# Patient Record
Sex: Female | Born: 1950
Health system: Southern US, Community
[De-identification: ages and names within clinical notes are randomized; demographics above are authoritative.]

## PROBLEM LIST (undated history)

## (undated) DIAGNOSIS — T7840XA Allergy, unspecified, initial encounter: Secondary | ICD-10-CM

## (undated) DIAGNOSIS — I714 Abdominal aortic aneurysm, without rupture, unspecified: Secondary | ICD-10-CM

## (undated) DIAGNOSIS — D869 Sarcoidosis, unspecified: Secondary | ICD-10-CM

## (undated) DIAGNOSIS — N289 Disorder of kidney and ureter, unspecified: Secondary | ICD-10-CM

## (undated) DIAGNOSIS — H269 Unspecified cataract: Secondary | ICD-10-CM

## (undated) DIAGNOSIS — J45909 Unspecified asthma, uncomplicated: Secondary | ICD-10-CM

## (undated) DIAGNOSIS — E785 Hyperlipidemia, unspecified: Secondary | ICD-10-CM

## (undated) DIAGNOSIS — I10 Essential (primary) hypertension: Secondary | ICD-10-CM

## (undated) DIAGNOSIS — H409 Unspecified glaucoma: Secondary | ICD-10-CM

## (undated) DIAGNOSIS — E079 Disorder of thyroid, unspecified: Secondary | ICD-10-CM

## (undated) DIAGNOSIS — M1990 Unspecified osteoarthritis, unspecified site: Secondary | ICD-10-CM

## (undated) DIAGNOSIS — E2740 Unspecified adrenocortical insufficiency: Secondary | ICD-10-CM

## (undated) HISTORY — PX: RENAL BIOPSY: SHX156

## (undated) HISTORY — PX: BREAST BIOPSY: SHX20

## (undated) HISTORY — DX: Allergy, unspecified, initial encounter: T78.40XA

## (undated) HISTORY — DX: Unspecified cataract: H26.9

## (undated) HISTORY — PX: OTHER SURGICAL HISTORY: SHX169

## (undated) HISTORY — PX: CARDIAC VALVE REPLACEMENT: SHX585

## (undated) HISTORY — DX: Unspecified asthma, uncomplicated: J45.909

## (undated) HISTORY — PX: ABDOMINAL AORTIC ANEURYSM REPAIR: SUR1152

## (undated) HISTORY — DX: Unspecified glaucoma: H40.9

## (undated) HISTORY — PX: EYE SURGERY: SHX253

---

## 1998-05-28 ENCOUNTER — Other Ambulatory Visit: Admission: RE | Admit: 1998-05-28 | Discharge: 1998-05-28 | Payer: Self-pay | Admitting: Obstetrics and Gynecology

## 1999-05-21 ENCOUNTER — Encounter: Admission: RE | Admit: 1999-05-21 | Discharge: 1999-05-21 | Payer: Self-pay | Admitting: Rheumatology

## 1999-05-21 ENCOUNTER — Encounter: Payer: Self-pay | Admitting: Rheumatology

## 1999-06-04 ENCOUNTER — Encounter: Payer: Self-pay | Admitting: Surgery

## 1999-06-04 ENCOUNTER — Ambulatory Visit (HOSPITAL_COMMUNITY): Admission: RE | Admit: 1999-06-04 | Discharge: 1999-06-04 | Payer: Self-pay | Admitting: Surgery

## 1999-06-20 ENCOUNTER — Encounter: Payer: Self-pay | Admitting: Surgery

## 1999-06-24 ENCOUNTER — Encounter (INDEPENDENT_AMBULATORY_CARE_PROVIDER_SITE_OTHER): Payer: Self-pay | Admitting: Specialist

## 1999-06-24 ENCOUNTER — Inpatient Hospital Stay (HOSPITAL_COMMUNITY): Admission: RE | Admit: 1999-06-24 | Discharge: 1999-06-29 | Payer: Self-pay | Admitting: Surgery

## 1999-06-24 ENCOUNTER — Encounter: Payer: Self-pay | Admitting: Surgery

## 1999-06-25 ENCOUNTER — Encounter: Payer: Self-pay | Admitting: Surgery

## 1999-06-26 ENCOUNTER — Encounter: Payer: Self-pay | Admitting: Surgery

## 1999-06-27 ENCOUNTER — Encounter: Payer: Self-pay | Admitting: Surgery

## 1999-07-09 ENCOUNTER — Encounter: Admission: RE | Admit: 1999-07-09 | Discharge: 1999-07-09 | Payer: Self-pay | Admitting: Surgery

## 1999-07-09 ENCOUNTER — Encounter: Payer: Self-pay | Admitting: Surgery

## 1999-07-16 ENCOUNTER — Encounter: Admission: RE | Admit: 1999-07-16 | Discharge: 1999-07-16 | Payer: Self-pay | Admitting: Surgery

## 1999-07-16 ENCOUNTER — Encounter: Payer: Self-pay | Admitting: Surgery

## 2000-04-13 ENCOUNTER — Other Ambulatory Visit: Admission: RE | Admit: 2000-04-13 | Discharge: 2000-04-13 | Payer: Self-pay | Admitting: Obstetrics and Gynecology

## 2000-08-28 ENCOUNTER — Encounter: Payer: Self-pay | Admitting: Rheumatology

## 2000-08-28 ENCOUNTER — Ambulatory Visit (HOSPITAL_COMMUNITY): Admission: RE | Admit: 2000-08-28 | Discharge: 2000-08-28 | Payer: Self-pay | Admitting: Rheumatology

## 2001-07-30 ENCOUNTER — Other Ambulatory Visit: Admission: RE | Admit: 2001-07-30 | Discharge: 2001-07-30 | Payer: Self-pay | Admitting: Obstetrics and Gynecology

## 2001-11-01 ENCOUNTER — Encounter: Admission: RE | Admit: 2001-11-01 | Discharge: 2001-11-01 | Payer: Self-pay | Admitting: Rheumatology

## 2001-11-01 ENCOUNTER — Encounter: Payer: Self-pay | Admitting: Rheumatology

## 2002-05-06 ENCOUNTER — Ambulatory Visit (HOSPITAL_COMMUNITY): Admission: RE | Admit: 2002-05-06 | Discharge: 2002-05-06 | Payer: Self-pay | Admitting: Gastroenterology

## 2002-10-11 ENCOUNTER — Other Ambulatory Visit: Admission: RE | Admit: 2002-10-11 | Discharge: 2002-10-11 | Payer: Self-pay | Admitting: Obstetrics and Gynecology

## 2006-05-12 ENCOUNTER — Encounter: Admission: RE | Admit: 2006-05-12 | Discharge: 2006-05-12 | Payer: Self-pay | Admitting: Rheumatology

## 2006-06-09 ENCOUNTER — Ambulatory Visit: Payer: Self-pay | Admitting: Vascular Surgery

## 2008-07-06 ENCOUNTER — Encounter: Admission: RE | Admit: 2008-07-06 | Discharge: 2008-07-06 | Payer: Self-pay | Admitting: Rheumatology

## 2008-07-18 ENCOUNTER — Ambulatory Visit: Payer: Self-pay | Admitting: Vascular Surgery

## 2010-07-09 NOTE — Assessment & Plan Note (Signed)
OFFICE VISIT   Cheryl [name], Cheryl [name]  DOB:  1950/12/01                                       07/18/2008  [id number]   The patient is a 60 year old female who I evaluated in 2008 with very  small abdominal aortic aneurysm.  She had previously undergone resection  of a descending thoracic aortic aneurysm by Dr. Cyndia Bent in 2001.  Her  aneurysm has been in the 2.4-2.8 cm range when last checked in 2008 and  a repeat duplex scan in our office today reveals the maximum diameter to  be about 2.4-2.5 cm.  She has had no abdominal or back symptoms.  She  also denies any claudication symptoms nor hemiparesis, aphasia,  amaurosis fugax, diplopia, blurred vision or syncope.   PAST MEDICAL HISTORY:  1. Sarcoidosis.  2. Possible fibromyalgia.  3. Hypertension.  4. Hyperlipidemia.  5. Negative for diabetes, coronary artery disease, COPD or stroke.   FAMILY HISTORY:  Positive for coronary artery disease in her mother and  father, adult onset diabetes in many family members and negative for  stroke.   SOCIAL HISTORY:  The patient does not use tobacco or alcohol and is  retired.   ALLERGIES:  Allergies to erythromycin, penicillin.   MEDICATIONS:  Please see health history form.   PHYSICAL EXAMINATION:  Vital signs:  Blood pressure 120/72, heart rate  56, respirations 14.  General:  She is a healthy-appearing female in no  apparent distress.  Alert and oriented x3.  Neck:  Supple.  3+ carotid  pulses palpable.  No bruits are audible.  Neurologic:  Normal.  No  palpable adenopathy in the neck.  Chest:  Clear to auscultation.  Abdomen:  Soft, nontender with no pulsatile mass detected.  She has 3+  femoral, popliteal and dorsalis pedis pulses bilaterally.   I discussed her the fact that the aneurysm is quite small and has not  enlarged in the last few years.  I do not think it requires routine  followup.  It is slightly larger than the normal diameter of an aorta.  At the very least an ultrasound in 5 years would be adequate to look at  the diameter of this but she does not need to be followed by a vascular  surgeon unless there is evidence of significant enlargement.   Nelda Severe Kellie Simmering, M.D.  Electronically Signed   JDL/MEDQ  D:  07/18/2008  T:  07/19/2008  Job:  [id number]   cc:   Ander Slade. Rockwell Alexandria, M.D.

## 2010-07-09 NOTE — Procedures (Signed)
DUPLEX ULTRASOUND OF ABDOMINAL AORTA   INDICATION:  Follow up abdominal aortic aneurysm.   HISTORY:  Diabetes:  No.  Cardiac:  No.  Hypertension:  Yes.  Smoking:  No.  Connective Tissue Disorder:  Family History:  Previous Surgery:   DUPLEX EXAM:         AP (cm)                   TRANSVERSE (cm)  Proximal             2.00 cm                   2.10 cm  Mid                  2.07 cm                   2.06 cm  Distal               2.36 cm                   2.42 cm  Right Iliac          1.00 cm                   1.06 cm  Left Iliac           0.96 cm                   1.04 cm   PREVIOUS:  Date:  AP:  2.8  TRANSVERSE:   IMPRESSION:  1. Mild dilatation of distal aorta noted, measuring 2.36 cm X 2.42 cm.  2. A size smaller than previously recorded.   ___________________________________________  Nelda Severe Kellie Simmering, M.D.   MG/MEDQ  D:  07/18/2008  T:  07/18/2008  Job:  [id number]

## 2010-07-12 NOTE — Op Note (Signed)
[place]. [facility]  Patient:    Cheryl [name], Cheryl [name]                       MRN: [id number] Proc. Date: 06/24/99 Adm. Date:  [id number] Attending:  Valla Leaver CC:         Gaye Pollack, M.D., CVTS Office                           Operative Report  PREOPERATIVE DIAGNOSIS:  Descending thoracic aortic aneurysm.  POSTOPERATIVE DIAGNOSIS:  Descending thoracic aortic aneurysm.  PROCEDURE:  Repair of descending thoracic aortic aneurysm using a 22 mm Hemashield tube graft and using left atrial to descending aortic partial circulatory bypass.  ATTENDING SURGEON:  Gaye Pollack, M.D.  ASSISTANT:  Lilia Argue. Servando Snare, M.D.  ANESTHESIA:  General endotracheal.  CLINICAL HISTORY:  This patient is a 60 year old black female with an unknown syndrome, including musculoskeletal pain, anemia, leukopenia, renal dysfunction, sarcoidosis of the scalp, elevated SED rate, positive rheumatoid factor in the past, and an elevated ACE level.  She was recently evaluated with right ankle swelling and a chest x-ray was obtained because of the concern of sarcoid.  This showed a 5 cm mass in the left hemithorax adjacent to the descending thoracic aorta.  CT scan of the chest without intravenous contrast due to her elevated creatinine showed a 4 cm to 5 cm homogenous mass adjacent to the mid descending thoracic aorta.  The remainder of the aorta appeared unremarkable.  There was also some concern about a possible left ventricular apical aneurysm.  There was also some nodularity in the left upper quadrant in the abdomen, but it was unclear if this was due to lymph nodes or possibly some varices.  A MRI of the chest was obtained and this clearly showed this was a descending thoracic aneurysm measuring about 5.5 cm.  It was saccular and fairly well-localized with normal-appearing aorta above and below. After review of the above studies and examination of the patient, it was felt  the best treatment was resection of the descending aortic aneurysm and replacement with a Dacron tube graft to prevent the complications of rupture.  I discussed the operative procedure of aneurysm repair with her and her husband, including alternatives, benefits and risks, including bleeding, possible blood transfusion, infection, spinal ischemia with temporary or permanent weakness or paralysis of the lower extremities, it may affect bowel or bladder function, myocardial infarction, injury to the heart or great vessels, and death.  They understood and agreed to proceed with surgery.  OPERATIVE PROCEDURE:  The patient was taken to the operating room, placed on the table in supine position.  After induction of general endotracheal anesthesia using a double-lumen endotracheal tube, the patient was turned into the right lateral decubitus position with the left side up.  Then, a spinal drain was placed by anesthesiology and was monitored throughout the case to maintain spinal pressure less than or equal to 10.  A Foley catheter was placed in the bladder using sterile technique.  The patient was then positioned for surgery in the right lateral decubitus position with the hips slightly turned to allow exposure of the left femoral vessels.  A transesophageal echocardiogram probe was passed by anesthesiology and used to monitor cardiac filling and function during the case.  Then, the left chest and groin were prepped with Betadine soap and solution and draped in usual sterile manner.  We attempted to place a left femoral arterial line, but was unable to get a catheter to pass easily and therefore, we decided not to use a left femoral arterial line for the case.  She had a right radial arterial line in place and this was used to monitor pressure. The left chest was then entered through a posterolateral thoracotomy incision. The pleural space was entered through the bed of the resected sixth rib.   The left lung was deflated.  Examination of the aorta showed a 5.5 cm saccular aneurysm in the mid descending thoracic aorta.  There was no sign of leakage or rupture.  The aorta above and below the aneurysm appeared unremarkable. There were several hard lymph nodes palpable within the hilum of the lung. The remainder of the pleural space was unremarkable.  Then, the left pericardium was opened posterior to the phrenic nerve.  Examination of the heart showed good ventricular contractility.  The apex of the heart was narrow and came to a point, but there was no aneurysm present.  I suspect that the findings on CT scan were due to the CT cuts being directly across this narrow tubular apex.  The left atrial appendage was quite small.  Therefore, I decided to cannulate the left superior pulmonary vein directly for venous drainage.  Then, the patient was given 8000 units of heparin.  When an adequate activated clotting time was achieved, around 300 seconds, a 22-French venous cannula was inserted into the left superior pulmonary vein and directed down into the left atrium without difficulty.  This was connected to the venous end of the partial bypass circuit.  Then, the descending thoracic aorta just above the diaphragm was cannulated using a 20-French aortic cannula for arterial inflow. Then, the aorta above and below the aneurysm was dissected and encircled carefully.  There were several large intercostal vessels above and below the aneurysm and these were carefully preserved.  Then, the patient was placed on partial circulatory bypass and flow was maintained at 1.5 to 2.5 L/min.  The systemic blood pressure in the right radial artery was kept at 90 mmHg or greater.  Then, the aorta proximal and distal to the aneurysm was cross-clamped, taking care to avoid the intercostal vessels.  The large intercostal vessels were individually clamped with atraumatic vascular clamps.  Then, the aneurysm  was opened.  There was a large amount of laminated, organized thrombus present within it.  The aneurysm sac was excised and sent to pathology for permanent examination.  There is a  well-developed neck above and below the aneurysm.  There were several small intercostal vessels coming off posteriorly directly adjacent to the aneurysm sac and these were oversewn with interrupted 3-0 silk sutures.  The openings of the large intercostal vessels were visible both proximal and distally to the neck of the aneurysm, but beyond the site where the cross-clamps were placed.  There was brisk bleeding from these.  Then, the aorta was replaced with a 22 mm woven Hemashield graft.  This was then anastomosed end-to-end to the aorta proximally and distally using continuous 3-0 Prolene suture.  The distal suture line was performed first and then the proximal suture line was performed.  Before tying the suture line, the proximal and distal aortic clamp were briefly removed to flush out any air and debris.  The suture was then tied.  The cross-clamps were removed and the clamps from the intercostals were removed.  The proximal and distal suture line appeared hemostatic.  The patient remained relatively hemodynamically stable during this time.  Then, we weaned from partial circulatory bypass without difficulty.  The left atrial and aortic cannulae were removed without difficulty.  The patient was then given protamine to reverse the heparin.  This resulted in good hemostasis. The coagulation profile was checked and the INR was 2.2 and therefore, two units of fresh frozen plasma were given.  Then, two chest tubes were placed through separate stab incisions, one positioned posteriorly and one anteriorly.  The ribs were reapproximated with #2 Vicryl pericostal sutures. The intercostal muscles were reapproximated with continuous #1 Vicryl suture. The chest wall muscles were then closed in layers using continuous #1  Vicryl suture.  Subcutaneous tissue was closed with continuous 2-0 Vicryl and the skin with 3-0 Vicryl subcuticular closure.  The left lung was then reinflated and the chest tubes connected to pleur-evac suction device.  The sponge, needle and instrument counts were correct according to the scrub nurse.  Dry, sterile dressing applied over the incision and around the chest tubes.  The patient was then turned into the supine position.  The double-lumen endotracheal tube was converted to a single-lumen tube.  The patient was then transported to the surgical intensive care unit in guarded, but stable condition.  The total cross-clamp time was 39 minutes and the total partial circulatory bypass time was 46 minutes. DD:  06/24/99 TD:  06/24/99 Job: [id number]

## 2010-07-12 NOTE — Op Note (Signed)
[place]. [facility]  Patient:    Cheryl [name], Cheryl [name]                       MRN: [id number] Proc. Date: 06/24/99 Adm. Date:  [id number] Attending:  Valla Leaver CC:         Gaye Pollack, M.D., CVTS Office                           Operative Report  PREOPERATIVE DIAGNOSIS:  Postoperative left chest bleeding.  POSTOPERATIVE DIAGNOSIS:  Postoperative left chest bleeding.  OPERATIVE PROCEDURE:   Re-exploration of left thoracotomy with ligation of bleeding sites.  ATTENDING SURGEON:  Gaye Pollack, M.D.  ANESTHESIA:  General endotracheal.  CLINICAL HISTORY:  This patient is a 60 year old woman, who underwent resection and grafting of a descending thoracic aortic aneurysm using partial left heart bypass earlier in the day.  She was hemostatic at the time of initial chest closure, but in the first several hours postoperatively, had increasing chest tube drainage.  This drainage remained bloody and increased to the point, where she had 300 cc out over a 30 minute period.  Therefore, we decided that she should be returned immediately to the operating room for re-exploration.  OPERATIVE PROCEDURE:  The patient was taken back to the operating room in hemodynamically stable condition.  Her hemoglobin just prior to going to the operating room was found to be 5.8.  She was given packed red blood cells upon arrival in the operating room.  After induction of general endotracheal anesthesia through the same single-lumen tube, the patient was positioned in the right lateral decubitus position with the left side up.  The left chest was prepped with Betadine soap and solution and draped in the usual sterile manner.  The left thoracotomy incision was reopened.  There was a large amount of fresh blood and clot present within the left pleural space.  This was completely evacuated.  Immediately, a bleeding intercostal artery was noted of the edge of the resected sixth  rib anteriorly.  This was ligated with a silk figure-of-eight suture.  The remainder of the pleural space was examined and there were no other bleeding sites seen.  The aortic and left superior pulmonary venous cannulation sites were hemostatic.  There was no blood in the pericardium.  The aortic graft anastomoses appeared hemostatic.  The chest was irrigated with saline and no further bleeding was seen.  Then, the chest tubes were declotted and repositioned.  The ribs were then reapproximated with interrupted #2 Vicryl pericostal sutures.  The intercostal muscles were closed with continuous #1 Vicryl suture.  The chest wall muscles were reapproximated with continuous #1 Vicryl suture.  Subcutaneous tissue was closed with continuous 2-0 Vicryl and the skin with 3-0 Vicryl subcuticular closure.  The sponge and needle counts were correct according to the scrub nurse.  The instruments were not counted since this was considered an emergent surgery. Dry, sterile dressings were then applied over the incisions and around the chest tubes.  The patient was then turned into the supine position.  A chest x-ray was done in the operating room and there were no surgical instruments seen within the chest cavity.  The patient remained hemodynamically stable and was then transported back to the surgical intensive care unit in guarded, but stable condition. DD:  06/25/99 TD:  06/26/99 Job: [id number]

## 2010-07-12 NOTE — Discharge Summary (Signed)
[place]. [facility]  Patient:    Cheryl [name], Cheryl [name]                       MRN: [id number] Adm. Date:  [id number] Disc. Date: [id number] Attending:  Valla Leaver Dictator:   Shelle Iron, P.A. CC:         Ander Slade. Rockwell Alexandria, M.D.             Gaye Pollack, M.D.             Juanda Bond. [place], M.D.                           Discharge Summary  DATE OF BIRTH: [birth year]  FINAL DIAGNOSES:  1. Descending thoracic aortic aneurysm.  2. Hypertension.  3. Fibromyalgia.  4. Sarcoid.  OPERATION/PROCEDURE: On June 24, 1999 the patient underwent repair of descending thoracic aneurysm with a #22 Hemashield graft, surgeon Dr. Gilford Raid.  HISTORY OF PRESENT ILLNESS: This patient is a 60 year old female who was referred by Dr. Rockwell Alexandria to Dr. Cyndia Bent because of possible descending thoracic aneurysm.  She was evaluated by Dr. Rockwell Alexandria for right ankle swelling.  Chest x-ray was obtained because of concern of possible sarcoid and this at that time showed a 5 cm mass in the left hemithorax.  Subsequent CT scan was done which showed a 5 cm homogenous mass at the base of the mid descending thoracic aorta.  On a couple of cuts it was possible to clearly see definition between the aorta and the mass, suggesting it might be an aneurysm of the aorta.  The remainder of the aorta remained unremarkable.  PAST MEDICAL HISTORY:  1. Sarcoid based on axillary lymph node biopsy in 1994.  2. Skin biopsy in 1992 which showed granulomatous dermatitis, could not     exclude sarcoid.  Because of her presentation surgery was suggested.  The risks and benefits were explained and the patient agreed to surgery.  HOSPITAL COURSE: The patient was admitted on June 24, 1999 and at that time underwent repair of the descending thoracic aneurysm using a #22 Hemashield graft.  The patient tolerated the procedure well and no intraoperative complications occurred.  Postoperatively the  patient had to go back to the operating room on the same day because of postoperative bleeding.  There was noted to be a bleeding intercostal artery at the edge of the resected sixth rib anteriorly, and a figure-of-eight silk stitch was put in and this stemmed the flow.  Subsequently the patient was taken back to the recovery room and she continued to progress after satisfactorily, and no untoward events occurred during this stay.  She progressed in satisfactory manner.  The incision was healing satisfactorily.  She had chest tubes in and these were discontinued without incident.  She was subsequently transferred to the step-down unit on Jun 26, 1999 and there she continued to do well.  She went through cardiac rehabilitation phase 1 without difficulty, and subsequently was started on cardiac rehabilitation phase 2 prior to discharge. The patient was doing quite well by Jun 28, 1999.  She was afebrile and vital signs were all stable.  Subsequently at this time it was planned that she should be discharged home the following day.  DISCHARGE MEDICATIONS:  1. Toprol XL 50 mg q.d.  2. Spironolactone 100 mg q.d.  3. Enteric-coated aspirin 325 mg q.d.  4. Tylox 1-2 p.o.  q.4h to q.6h p.r.n. pain.  FOLLOW-UP: The patient will follow up with Dr. Cyndia Bent on Tuesday, Jul 09, 1999, at 10:30 a.m.  DISCHARGE CONDITION: Satisfactory and stable. DD:  06/28/99 TD:  07/01/99 Job: [id number]

## 2011-06-05 ENCOUNTER — Encounter (HOSPITAL_COMMUNITY): Payer: Self-pay | Admitting: *Deleted

## 2011-06-05 ENCOUNTER — Emergency Department (INDEPENDENT_AMBULATORY_CARE_PROVIDER_SITE_OTHER): Payer: BC Managed Care – PPO

## 2011-06-05 ENCOUNTER — Emergency Department (INDEPENDENT_AMBULATORY_CARE_PROVIDER_SITE_OTHER)
Admission: EM | Admit: 2011-06-05 | Discharge: 2011-06-05 | Disposition: A | Discharge status: Home / Self Care | Payer: BC Managed Care – PPO | Source: Home / Self Care | Attending: Emergency Medicine | Admitting: Emergency Medicine

## 2011-06-05 DIAGNOSIS — M109 Gout, unspecified: Secondary | ICD-10-CM

## 2011-06-05 HISTORY — DX: Abdominal aortic aneurysm, without rupture, unspecified: I71.40

## 2011-06-05 HISTORY — DX: Sarcoidosis, unspecified: D86.9

## 2011-06-05 HISTORY — DX: Unspecified osteoarthritis, unspecified site: M19.90

## 2011-06-05 HISTORY — DX: Disorder of kidney and ureter, unspecified: N28.9

## 2011-06-05 HISTORY — DX: Disorder of thyroid, unspecified: E07.9

## 2011-06-05 HISTORY — DX: Essential (primary) hypertension: I10

## 2011-06-05 HISTORY — DX: Hyperlipidemia, unspecified: E78.5

## 2011-06-05 HISTORY — DX: Unspecified adrenocortical insufficiency: E27.40

## 2011-06-05 HISTORY — DX: Abdominal aortic aneurysm, without rupture: I71.4

## 2011-06-05 LAB — URIC ACID: Uric Acid, Serum: 9.5 mg/dL — ABNORMAL HIGH (ref 2.4–7.0)

## 2011-06-05 MED ORDER — COLCHICINE 0.6 MG PO TABS: ORAL_TABLET | ORAL | Status: DC

## 2011-06-05 NOTE — ED Provider Notes (Signed)
Chief Complaint  Patient presents with  . Foot Pain    History of Present Illness:   The patient is a 61 year old female with a one-day history of swelling, redness, and tenderness to touch over the dorsum of the left foot. She denies any injury to the foot and it hurts to bear weight. She's never had anything like this before. No history of gout or family history of gout. She denies any fever, chills, or sweats.  Review of Systems:  Other than noted above, the patient denies any of the following symptoms: Systemic:  No fevers, chills, sweats, or aches.  No fatigue or tiredness. Musculoskeletal:  No joint pain, arthritis, bursitis, swelling, back pain, or neck pain. Neurological:  No muscular weakness, paresthesias, headache, or trouble with speech or coordination.  No dizziness.   [place]:  Past medical history, family history, social history, meds, and allergies were reviewed.  Physical Exam:   Vital signs:  BP 125/73  Pulse 80  Temp(Src) 98.2 F (36.8 C) (Oral)  Resp 18  SpO2 100% Gen:  Alert and oriented times 3.  In no distress. Musculoskeletal: There is an area of erythema, swelling, heat, and tenderness to palpation over the dorsum of the left foot, overlying the fifth metatarsal. Foot exam was otherwise normal. Otherwise, all joints had a full a ROM with no swelling, bruising or deformity.  No edema, pulses full. Extremities were warm and pink.  Capillary refill was brisk.  Skin:  Clear, warm and dry.  No rash. Neuro:  Alert and oriented times 3.  Muscle strength was normal.  Sensation was intact to light touch.   Radiology:  Dg Foot Complete Left  06/05/2011  *RADIOLOGY REPORT*  Clinical Data: Left lateral foot pain and swelling.  LEFT FOOT - COMPLETE 3+ VIEW  Comparison: None.  Findings: There is no evidence of fracture or dislocation.  The joint spaces are preserved.  There is no evidence of talar subluxation; the subtalar joint is unremarkable in appearance.  Mild soft tissue  swelling is noted along the fifth metatarsal. Diffuse vascular calcifications are seen.  No radiopaque foreign bodies are identified.  IMPRESSION:  1.  No evidence of fracture or dislocation.  No radiopaque foreign bodies identified. 2.  Mild soft tissue swelling along the fifth metatarsal. 3.  Diffuse vascular calcifications seen.  Original Report Authenticated By: Santa Lighter, M.D.   Other Labs Obtained at [facility]:  Uric acid level was obtained and.  Results are pending at this time and we will call about any positive results.  Assessment:  The encounter diagnosis was Gout.  Plan:   1.  The following meds were prescribed:   New Prescriptions   COLCHICINE 0.6 MG TABLET    Take 2 now and 1 in 1 hour.  May repeat dose once daily.  For gout attack.   2.  The patient was instructed in symptomatic care, including rest and activity, elevation, application of ice and compression.  Appropriate handouts were given. 3.  The patient was told to return if becoming worse in any way, if no better in 3 or 4 days, and given some red flag symptoms that would indicate earlier return.   4.  The patient was told to follow up with Dr. Lenna Gilford in one week.   Harden Mo, MD 06/05/11 2220

## 2011-06-05 NOTE — ED Notes (Signed)
Unsure if any injury - does not recall one; states woke up this morning w/ painful left lateral foot with redness to area.  C/O painful ambulation.  Has applied heat wraps and kept LLE elevated without any relief.

## 2011-06-05 NOTE — Discharge Instructions (Signed)
Gout Gout is an inflammatory condition (arthritis) caused by a buildup of uric acid crystals in the joints. Uric acid is a chemical that is normally present in the blood. Under some circumstances, uric acid can form into crystals in your joints. This causes joint redness, soreness, and swelling (inflammation). Repeat attacks are common. Over time, uric acid crystals can form into masses (tophi) near a joint, causing disfigurement. Gout is treatable and often preventable. CAUSES  The disease begins with elevated levels of uric acid in the blood. Uric acid is produced by your body when it breaks down a naturally found substance called purines. This also happens when you eat certain foods such as meats and fish. Causes of an elevated uric acid level include:  Being passed down from parent to child (heredity).   Diseases that cause increased uric acid production (obesity, psoriasis, some cancers).   Excessive alcohol use.   Diet, especially diets rich in meat and seafood.   Medicines, including certain cancer-fighting drugs (chemotherapy), diuretics, and aspirin.   Chronic kidney disease. The kidneys are no longer able to remove uric acid well.   Problems with metabolism.  Conditions strongly associated with gout include:  Obesity.   High blood pressure.   High cholesterol.   Diabetes.  Not everyone with elevated uric acid levels gets gout. It is not understood why some people get gout and others do not. Surgery, joint injury, and eating too much of certain foods are some of the factors that can lead to gout. SYMPTOMS   An attack of gout comes on quickly. It causes intense pain with redness, swelling, and warmth in a joint.   Fever can occur.   Often, only one joint is involved. Certain joints are more commonly involved:   Base of the big toe.   Knee.   Ankle.   Wrist.   Finger.  Without treatment, an attack usually goes away in a few days to weeks. Between attacks, you  usually will not have symptoms, which is different from many other forms of arthritis. DIAGNOSIS  Your caregiver will suspect gout based on your symptoms and exam. Removal of fluid from the joint (arthrocentesis) is done to check for uric acid crystals. Your caregiver will give you a medicine that numbs the area (local anesthetic) and use a needle to remove joint fluid for exam. Gout is confirmed when uric acid crystals are seen in joint fluid, using a special microscope. Sometimes, blood, urine, and X-ray tests are also used. TREATMENT  There are 2 phases to gout treatment: treating the sudden onset (acute) attack and preventing attacks (prophylaxis). Treatment of an Acute Attack  Medicines are used. These include anti-inflammatory medicines or steroid medicines.   An injection of steroid medicine into the affected joint is sometimes necessary.   The painful joint is rested. Movement can worsen the arthritis.   You may use warm or cold treatments on painful joints, depending which works best for you.   Discuss the use of coffee, vitamin C, or cherries with your caregiver. These may be helpful treatment options.  Treatment to Prevent Attacks After the acute attack subsides, your caregiver may advise prophylactic medicine. These medicines either help your kidneys eliminate uric acid from your body or decrease your uric acid production. You may need to stay on these medicines for a very long time. The early phase of treatment with prophylactic medicine can be associated with an increase in acute gout attacks. For this reason, during the first few months   of treatment, your caregiver may also advise you to take medicines usually used for acute gout treatment. Be sure you understand your caregiver's directions. You should also discuss dietary treatment with your caregiver. Certain foods such as meats and fish can increase uric acid levels. Other foods such as dairy can decrease levels. Your caregiver  can give you a list of foods to avoid. HOME CARE INSTRUCTIONS   Do not take aspirin to relieve pain. This raises uric acid levels.   Only take over-the-counter or prescription medicines for pain, discomfort, or fever as directed by your caregiver.   Rest the joint as much as possible. When in bed, keep sheets and blankets off painful areas.   Keep the affected joint raised (elevated).   Use crutches if the painful joint is in your leg.   Drink enough water and fluids to keep your urine clear or pale yellow. This helps your body get rid of uric acid. Do not drink alcoholic beverages. They slow the passage of uric acid.   Follow your caregiver's dietary instructions. Pay careful attention to the amount of protein you eat. Your daily diet should emphasize fruits, vegetables, whole grains, and fat-free or low-fat milk products.   Maintain a healthy body weight.  SEEK MEDICAL CARE IF:   You have an oral temperature above 102 F (38.9 C).   You develop diarrhea, vomiting, or any side effects from medicines.   You do not feel better in 24 hours, or you are getting worse.  SEEK IMMEDIATE MEDICAL CARE IF:   Your joint becomes suddenly more tender and you have:   Chills.   An oral temperature above 102 F (38.9 C), not controlled by medicine.  MAKE SURE YOU:   Understand these instructions.   Will watch your condition.   Will get help right away if you are not doing well or get worse.  Document Released: 02/08/2000 Document Revised: 01/30/2011 Document Reviewed: 05/21/2009 [facility] Patient Information 2012 [place].Purine Restricted Diet A low-purine diet consists of foods that reduce uric acid made in your body. INDICATIONS FOR USE  Your caregiver may ask you to follow a low-purine diet to reduce gout flairs.  GUIDELINES  Avoid high-purine foods, including all alcohol, yeast extracts taken as supplements, and sauces made from meats (like gravy). Do not eat high-purine  meats, including anchovies, sardines, herring, mussels, tuna, codfish, scallops, trout, haddock, bacon, organ meats, tripe, goose, wild game, and sweetbreads.  Grains  Allowed/Recommended: All, except those listed to consume in moderation.   Consume in Moderation: Oatmeal (? cup uncooked daily), wheat bran or germ ( cup daily), and whole grains.  Vegetables  Allowed/Recommended: All, except those listed to consume in moderation.   Consume in Moderation: Asparagus, cauliflower, spinach, mushrooms, and green peas ( cup daily).  Fruit  Allowed/Recommended: All.   Consume in Moderation: None.  Meat and Meat Substitutes  Allowed/Recommended: Eggs, nuts, and peanut butter.   Consume in Moderation: Limit to 4 to 6 oz daily. Avoid high-purine meats. Lentils, peas, and dried beans (1 cup daily).  Milk  Allowed/Recommended: All. Choose low-fat or skim when possible.   Consume in Moderation: None.  Fats and Oils  Allowed/Recommended: All.   Consume in Moderation: None.  Beverages  Allowed/Recommended: All, except those listed to avoid.   Avoid: All alcohol.  Condiments/Miscellaneous  Allowed/Recommended: All, except those listed to consume in moderation.   Consume in Moderation: Bouillon and meat-based broths and soups.  Document Released: 06/07/2010 Document Revised: 01/30/2011 Document  Reviewed: 06/07/2010 [facility] Patient Information 2012 [place].

## 2011-06-11 ENCOUNTER — Telehealth (HOSPITAL_COMMUNITY): Payer: Self-pay | Admitting: *Deleted

## 2011-06-11 NOTE — ED Notes (Addendum)
Uric Acid 9.5 H. Pt. Aadequately treated with Colchicine. Lab shown to Dr. Jake Michaelis. He said to notify pt. to see Dr. Trudie Reed. He asked me to fax her result to the office. I called pt. Pt. verified x 2 and given results.  Pt. instructed to f/u with Dr. Trudie Reed. She said she has an appointment on 5/2. She asked me if that was to long to wait to be seen. I told her to call the office and tell them she was seen here for gout and ask them if they want to move the appointment up. She asked if I could fax the result. I told her Dr. Jake Michaelis had asked me to do that and I will.  Cheryl [name] 06/11/2011  Lab result faxed to [phone] and confirmation received.

## 2014-01-25 ENCOUNTER — Ambulatory Visit (HOSPITAL_COMMUNITY)
Admission: RE | Admit: 2014-01-25 | Discharge: 2014-01-25 | Disposition: A | Discharge status: Home / Self Care | Payer: Medicare Other | Source: Ambulatory Visit | Attending: Rheumatology | Admitting: Rheumatology

## 2014-01-25 DIAGNOSIS — R531 Weakness: Secondary | ICD-10-CM | POA: Diagnosis not present

## 2014-01-25 DIAGNOSIS — M5489 Other dorsalgia: Secondary | ICD-10-CM | POA: Insufficient documentation

## 2014-01-25 DIAGNOSIS — M6281 Muscle weakness (generalized): Secondary | ICD-10-CM

## 2014-01-25 DIAGNOSIS — Z9181 History of falling: Secondary | ICD-10-CM | POA: Diagnosis not present

## 2014-01-25 DIAGNOSIS — M545 Low back pain, unspecified: Secondary | ICD-10-CM

## 2014-01-25 DIAGNOSIS — R29898 Other symptoms and signs involving the musculoskeletal system: Secondary | ICD-10-CM

## 2014-01-25 DIAGNOSIS — M25552 Pain in left hip: Secondary | ICD-10-CM | POA: Diagnosis not present

## 2014-01-25 DIAGNOSIS — M25551 Pain in right hip: Secondary | ICD-10-CM | POA: Diagnosis not present

## 2014-01-25 NOTE — Therapy (Signed)
[facility] [address] [place], [place], [address] Phone: [phone]   Fax:  [phone]  Physical Therapy Evaluation  Patient Details  Name: Cheryl [name] MRN: [id number] Date of Birth: [birth year]  Encounter Date: 01/25/2014      PT End of Session - 01/25/14 1202    Visit Number 1   Number of Visits 16   Date for PT Re-Evaluation 02/24/14   Authorization Type UHC   Authorization - Visit Number 1   Authorization - Number of Visits 16   PT Start Time 1120   PT Stop Time [id number]   PT Time Calculation (min) 32 min      Past Medical History  Diagnosis Date  . Hypertension   . Thyroid disease     hypothyroidism  . Renal disorder     renal insufficiency  . Hypoaldosteronism   . AAA (abdominal aortic aneurysm)   . Arthritis     osteoarthritis  . Sarcoidosis   . Hyperlipidemia     Past Surgical History  Procedure Laterality Date  . Abdominal aortic aneurysm repair    . Breast biopsy    . Renal vein sampling    . Renal biopsy      There were no vitals taken for this visit.  Visit Diagnosis:  Midline low back pain without sciatica  Hip pain, bilateral  Risk for falls  Weakness of both hips  Weakness of both legs  Weakness of trunk musculature      Subjective Assessment - 01/25/14 1133    Symptoms Pain in low back into hips and knees.    Pertinent History Bilateral hip pain and stiffness secondary to a history of hip bursitis. No previous physical therapy. history of OA with a rheumatoid facto, sarcoidosis, recent history of gout. Recent new complaint of low back pain and patient notes knees have began hurting recently as well as overall decreased balance resulti9gn in requiring a cart to walk around [place].  notes recent uckling/givign way of knees with walkign and stairs.    How long can you stand comfortably? 1 hour, any more and it starts to hurt.    How long can you walk comfortably? uncomfortable always due to limited  balance.    Patient Stated Goals to increase balance and LE mobility and strength and decrease pain in low back hips and knees.   Currently in Pain? Yes   Pain Score 5    Pain Location Back   Pain Orientation Right;Left;Lateral   Pain Descriptors / Indicators Aching;Dull   Pain Type Chronic pain   Pain Radiating Towards back, lateral hips into knees   Pain Onset More than a month ago   Pain Frequency Intermittent   Aggravating Factors  walking, stooping over,   Pain Relieving Factors medication only          [facility] PT Assessment - 01/25/14 0001    Assessment   Medical Diagnosis hip and knee  weakness as well as decreased balance. lumbago   Onset Date 06/24/13   Next MD Visit Dr. Lenn Sink    Prior Therapy no   Precautions   Precautions None   Balance Screen   Has the patient fallen in the past 6 months No   Has the patient had a decrease in activity level because of a fear of falling?  No   Is the patient reluctant to leave their home because of a fear of falling?  No   Home Environment  Living Enviornment Private residence   Prior Function   Level of Independence Independent with basic ADLs   Cognition   Overall Cognitive Status Within Functional Limits for tasks assessed   Observation/Other Assessments   Focus on Therapeutic Outcomes (FOTO)  50% limited   Sit to Stand   Comments no hands but maximal effort. 5x sit to stand 26 seconds with knees supported by chair   Other:   Other/ Comments Walking: difficulty with criss cross walkig, and limited foot clearance bilaterally secondary to trendelenerg gait.   Other:   Other/Comments palpation: lhypermoble Lumbar spine   AROM   Overall AROM Comments WNL throughout: back, hip, ankle knee   Strength   Right Hip Flexion --  4-/5   Right Hip Extension 2+/5   Right Hip ABduction 2+/5   Right Hip ADduction 3+/5   Left Hip Flexion --  4-/5   Left Hip Extension 2+/5   Left Hip ABduction 2+/5   Left Hip ADduction 3+/5    Right Knee Flexion --  4-/5   Right Knee Extension 4/5   Left Knee Flexion --  4-/5   Left Knee Extension 4/5   Right Ankle Dorsiflexion 5/5   Left Ankle Dorsiflexion 5/5   Lumbar Flexion --  4-/5   Lumbar Extension --  4-/5            PT Education - 01/25/14 1201    Education provided Yes   Education Details Pt educated that pain was related to instability and weakness of bilateral knee, hip and back.    Person(s) Educated Patient   Methods Explanation;Demonstration   Comprehension Verbalized understanding          PT Short Term Goals - 01/25/14 1208    PT SHORT TERM GOAL #1   Title Patient will be independent with low level HEP for trunk and LE strengthening   Time 4   Period Weeks   Status New   PT SHORT TERM GOAL #2   Title patient will demonstrate increased abdomenal muscle strength of 4/5 MMT to improve trunk stability   Baseline 4-/5 bilaterally   Time 4   Period Weeks   Status New   PT SHORT TERM GOAL #3   Title Patient will demonstrate 3/5MMt hip abduction bilaterally indicating improved hip stability   Baseline 2+/5 bilaterally   Time 4   Period Weeks   Status New   PT SHORT TERM GOAL #4   Title Patient will demonstrate increased knee flexion strength of 4/5 MMT indicating improved knee stability and decreased buckling durign gait.    Baseline 4-/5 bilaterally   Time 4   Period Weeks   Status New   PT SHORT TERM GOAL #5   Title Patient will be able to perform sit to stand 5x in <15 seconds to indicate patient not at high risk of falls.    Baseline 26 seconds   Time 4   Period Weeks   Status New          PT Long Term Goals - 01/25/14 1213    PT LONG TERM GOAL #1   Title Patient will be independent with advanced HEP for trunk and LE strengthening   Time 8   Period Weeks   PT LONG TERM GOAL #2   Title patient will demonstrate increased abdomenal muscle strength of 5/5 MMT to improve trunk stability   Time 8   Period Weeks   Status  New   PT LONG TERM GOAL #  3   Title Patient will demonstrate 4/5MMt hip extension bilaterally indicating improved hip stability   Baseline 2+/5 MMT   Time 8   Period Weeks   Status New   PT LONG TERM GOAL #4   Title Patient will demonstrate increased knee flexion strength of 4+/5 MMT indicating improved knee stability and decreased buckling durign gait.    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           Plan - 01/25/14 1204    Clinical Impression Statement Patient demsontrated decreased balance and leow back pain secodnary to weakness theroughtou bilateral LEs and trunk resultign in limited ability to control self in space durign gait. Patient will benefit from skilled physical therapy ro increase bilateral LE strength and balance so patient can walk withtou requiring a shopping cart for balance.    Pt will benefit from skilled therapeutic intervention in order to improve on the following deficits Abnormal gait;Pain;Decreased strength;Difficulty walking;Decreased balance;Improper body mechanics;Postural dysfunction;Decreased endurance   Rehab Potential Good   PT Frequency 2x / week   PT Duration 8 weeks   PT Treatment/Interventions Therapeutic exercise;Balance training;Patient/family education;Manual techniques;Functional mobility training;Therapeutic activities;Gait training   PT Next Visit Plan Therapy to focus on strengthening and progressing balance. Due to patient's limited balance introduce supine and prone core and LE strengtheing exercises next session for HEP.    PT Home Exercise Plan 5x sit to stand 3x daily.    Consulted and Agree with Plan of Care Patient       Problem List There are no active problems to display for this patient.   Lakecia Deschamps R 01/25/2014, 12:19 PM

## 2014-01-27 ENCOUNTER — Encounter (HOSPITAL_COMMUNITY): Payer: Self-pay

## 2014-01-27 ENCOUNTER — Ambulatory Visit (HOSPITAL_COMMUNITY)
Admission: RE | Admit: 2014-01-27 | Discharge: 2014-01-27 | Disposition: A | Discharge status: Home / Self Care | Payer: Medicare Other | Source: Ambulatory Visit | Attending: Rheumatology | Admitting: Rheumatology

## 2014-01-27 DIAGNOSIS — R29898 Other symptoms and signs involving the musculoskeletal system: Secondary | ICD-10-CM

## 2014-01-27 DIAGNOSIS — Z9181 History of falling: Secondary | ICD-10-CM

## 2014-01-27 DIAGNOSIS — M545 Low back pain, unspecified: Secondary | ICD-10-CM

## 2014-01-27 DIAGNOSIS — M25552 Pain in left hip: Secondary | ICD-10-CM

## 2014-01-27 DIAGNOSIS — M6281 Muscle weakness (generalized): Secondary | ICD-10-CM

## 2014-01-27 DIAGNOSIS — M5489 Other dorsalgia: Secondary | ICD-10-CM | POA: Diagnosis not present

## 2014-01-27 DIAGNOSIS — M25551 Pain in right hip: Secondary | ICD-10-CM

## 2014-01-27 NOTE — Patient Instructions (Signed)
Bridge    On Elbows (Prone)   Lie back, legs bent. Inhale, pressing hips up. Keeping ribs in, lengthen lower back. Exhale, rolling down along spine from top. Repeat 10 times. Do 2  sessions per day.  Copyright  VHI. All rights reserved.    Bent knee raise   Lie supine with knees flexed. Raise one leg a few inches. Hold 5 seconds then return and raise other leg. Keep hips stationary. Do 10 reps for 5 seconds holds and complete 2 sets a day  Copyright  VHI. All rights reserved.   HIP: Flexion / KNEE: Extension, Straight Leg Raise   Raise leg, keeping knee straight. Perform slowly. _ 10 reps per set, 2  sets per day,  3-4 days per week   Copyright  VHI. All rights reserved.   Straight Leg Raise (Prone)   Abdomen and head supported, keep left knee locked and raise leg at hip. Avoid arching low back. Repeat 10  times per set. Do 2 sets per session. Do 2  sessions per day.  http://orth.exer.us/1113   Copyright  VHI. All rights reserved.    Abduction: Side Leg Lift (Eccentric) - Side-Lying   Lie on side. Lift top leg slightly higher than shoulder level. Keep top leg straight with body, toes pointing forward. Slowly lower for 3-5 seconds. 10 reps per set 2 sets per day, 3-4 days per week.   Copyright  VHI. All rights reserved.

## 2014-01-27 NOTE — Therapy (Signed)
[facility] [place], [place], [address] Phone: [phone]   Fax:  [phone]  Physical Therapy Treatment  Patient Details  Name: KORTNEE GEOFFROY MRN: [id number] Date of Birth: Nov 14, 1950  Encounter Date: 01/27/2014      PT End of Session - 01/27/14 1821    Visit Number 2   Number of Visits 16   Date for PT Re-Evaluation 02/24/14   Authorization Type UHC   Authorization - Visit Number 2   Authorization - Number of Visits 16   PT Start Time [id number]   PT Stop Time 1815   PT Time Calculation (min) 40 min   Activity Tolerance Patient tolerated treatment well   Behavior During Therapy [facility] for tasks assessed/performed      Past Medical History  Diagnosis Date  . Hypertension   . Thyroid disease     hypothyroidism  . Renal disorder     renal insufficiency  . Hypoaldosteronism   . AAA (abdominal aortic aneurysm)   . Arthritis     osteoarthritis  . Sarcoidosis   . Hyperlipidemia     Past Surgical History  Procedure Laterality Date  . Abdominal aortic aneurysm repair    . Breast biopsy    . Renal vein sampling    . Renal biopsy      There were no vitals taken for this visit.  Visit Diagnosis:  Midline low back pain without sciatica  Hip pain, bilateral  Risk for falls  Weakness of both hips  Weakness of both legs  Weakness of trunk musculature      Subjective Assessment - 01/27/14 1747    Symptoms Lower back is feeling today, feels the weather plays a part in pain.  Hip bothered her this morning, feeling okay now.     Currently in Pain? Yes   Pain Score 3    Pain Location Back   Pain Orientation Right;Left;Lower            [facility] Adult PT Treatment/Exercise - 01/27/14 0001    Exercises   Exercises Lumbar   Lumbar Exercises: Stretches   Prone on Elbows Stretch Limitations   Prone on Elbows Stretch Limitations 2 minutes   Lumbar Exercises: Supine   Ab Set 10 reps;5 seconds;Limitations   AB Set  Limitations Multimodal cueing for correct mm activatin    Bent Knee Raise 10 reps;Limitations   Bridge 10 reps   Straight Leg Raise 10 reps   Other Supine Lumbar Exercises Diagraphatic breathing 10x    Lumbar Exercises: Sidelying   Hip Abduction 10 reps   Lumbar Exercises: Prone   Straight Leg Raise 10 reps   Other Prone Lumbar Exercises Heel squeeze 10x 5"          PT Education - 01/27/14 1820    Education provided Yes   Education Details Instructed HEP   Person(s) Educated Patient   Methods Explanation;Demonstration;Handout;Verbal cues   Comprehension Verbalized understanding;Returned demonstration          PT Short Term Goals - 01/27/14 1826    PT SHORT TERM GOAL #1   Title Patient will be independent with low level HEP for trunk and LE strengthening   Status On-going   PT SHORT TERM GOAL #2   Title patient will demonstrate increased abdomenal muscle strength of 4/5 MMT to improve trunk stability   Status On-going   PT SHORT TERM GOAL #3   Title Patient will demonstrate 3/5MMt hip abduction bilaterally indicating improved hip stability  Status On-going   PT SHORT TERM GOAL #4   Title Patient will demonstrate increased knee flexion strength of 4/5 MMT indicating improved knee stability and decreased buckling durign gait.    Status On-going   PT SHORT TERM GOAL #5   Title Patient will be able to perform sit to stand 5x in <15 seconds to indicate patient not at high risk of falls.           PT Long Term Goals - 01/27/14 1826    PT LONG TERM GOAL #1   Title Patient will be independent with advanced HEP for trunk and LE strengthening   PT LONG TERM GOAL #2   Title patient will demonstrate increased abdomenal muscle strength of 5/5 MMT to improve trunk stability   PT LONG TERM GOAL #3   Title Patient will demonstrate 4/5MMt hip extension bilaterally indicating improved hip stability   PT LONG TERM GOAL #4   Title Patient will demonstrate increased knee flexion  strength of 4+/5 MMT indicating improved knee stability and decreased buckling durign gait.    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           Plan - 01/27/14 1821    Clinical Impression Statement Began PT POC with focus on core and LE strengthening to assist with LBP.  Pt able to complete all exercises with cueing for technqiue and diagraphatic breathing techniques to keep pt. from holding breath during exercise.  No reports of increased pain through session, pt limited by fatigue with activities.  Pt given HEP worksheet this session.     PT Next Visit Plan Continue with current PT POC focusing on core and LE strengthening and progress balance as able.  Review HEP to assure correct technique done at home.  Begin 3D hip excursion next session with guard for balance issues.     PT Home Exercise Plan Given supine, sidelying and prone exercises      Problem List There are no active problems to display for this patient.  [address], [place] Lento 01/27/2014, 6:27 PM

## 2014-01-31 ENCOUNTER — Ambulatory Visit (HOSPITAL_COMMUNITY)
Admission: RE | Admit: 2014-01-31 | Discharge: 2014-01-31 | Disposition: A | Discharge status: Home / Self Care | Payer: Medicare Other | Source: Ambulatory Visit | Attending: Rheumatology | Admitting: Rheumatology

## 2014-01-31 ENCOUNTER — Encounter (HOSPITAL_COMMUNITY): Payer: Self-pay | Admitting: Physical Therapy

## 2014-01-31 DIAGNOSIS — Z9181 History of falling: Secondary | ICD-10-CM

## 2014-01-31 DIAGNOSIS — R29898 Other symptoms and signs involving the musculoskeletal system: Secondary | ICD-10-CM

## 2014-01-31 DIAGNOSIS — M25551 Pain in right hip: Secondary | ICD-10-CM

## 2014-01-31 DIAGNOSIS — M545 Low back pain, unspecified: Secondary | ICD-10-CM

## 2014-01-31 DIAGNOSIS — M5489 Other dorsalgia: Secondary | ICD-10-CM | POA: Diagnosis not present

## 2014-01-31 DIAGNOSIS — M6281 Muscle weakness (generalized): Secondary | ICD-10-CM

## 2014-01-31 DIAGNOSIS — M25552 Pain in left hip: Secondary | ICD-10-CM

## 2014-01-31 NOTE — Patient Instructions (Signed)
Hamstring Stretch (Standing)  HOLD ONTO COUNTER TOP FOR BALANCE.  Standing, place one heel on chair or bench. Keeping torso straight, lean forward slowly until a stretch is felt in back of same thigh.  Hold __30__ seconds. Repeat with 3 times every day.    Lower Trunk Rotation Stretch   Keeping back flat and feet together, rotate knees to left side. Hold 2-3 seconds.  Repeat __10__ times per set. Do __1__ sets per session. Do __1__ sessions per day.  Piriformis Stretch, Supine   Lie supine, one ankle crossed onto opposite knee. Holding bottom leg behind knee, gently pull legs toward chest until stretch is felt in buttock of top leg. Hold _30__ seconds. For deeper stretch gently push top knee away from body.   Repeat _3__ times per session. Do __1_ sessions per day.  KNEE: Quadriceps - Prone  Place strap around ankle. Bring ankle toward buttocks. Press hip into surface. Hold _30__ seconds. _3__ reps per set, _1__ sets per day, _7__ days per week

## 2014-01-31 NOTE — Therapy (Signed)
[facility] [address] [place], [place], [address] Phone: [phone]   Fax:  [phone]  Physical Therapy Treatment  Patient Details  Name: Cheryl [name] MRN: [id number] Date of Birth: 1950-12-28  Encounter Date: 01/31/2014      PT End of Session - 01/31/14 1734    Visit Number 3   Number of Visits 16   Date for PT Re-Evaluation 02/24/14   PT Start Time [id number]   PT Stop Time 1735   PT Time Calculation (min) 44 min   Activity Tolerance Patient tolerated treatment well   Behavior During Therapy [facility] for tasks assessed/performed      Past Medical History  Diagnosis Date  . Hypertension   . Thyroid disease     hypothyroidism  . Renal disorder     renal insufficiency  . Hypoaldosteronism   . AAA (abdominal aortic aneurysm)   . Arthritis     osteoarthritis  . Sarcoidosis   . Hyperlipidemia     Past Surgical History  Procedure Laterality Date  . Abdominal aortic aneurysm repair    . Breast biopsy    . Renal vein sampling    . Renal biopsy      There were no vitals taken for this visit.  Visit Diagnosis:  Midline low back pain without sciatica  Hip pain, bilateral  Risk for falls  Weakness of both hips  Weakness of both legs  Weakness of trunk musculature      Subjective Assessment - 01/31/14 1656    Symptoms Pt reports mild complaints of pain today after taking excedrin for pain.    Currently in Pain? Yes   Pain Score 3    Pain Location Back   Pain Orientation Right;Left;Lower   Pain Radiating Towards to hips (with pt reports of bursitis)            [facility] Adult PT Treatment/Exercise - 01/31/14 1655    Exercises   Exercises Lumbar   Lumbar Exercises: Stretches   Active Hamstring Stretch 3 reps;30 seconds   Active Hamstring Stretch Limitations 12" Step   Passive Hamstring Stretch 3 reps;30 seconds   Passive Hamstring Stretch Limitations Gastroc Stretch, Slantboard   Lower Trunk Rotation 5 reps   Quad Stretch  1 rep;20 seconds   Piriformis Stretch 2 reps;20 seconds   Piriformis Stretch Limitations Supine figure 4 with rope and PT assist   Lumbar Exercises: Standing   Other Standing Lumbar Exercises 3D Hip Excursion x10   Lumbar Exercises: Supine   Bent Knee Raise 10 reps   Bridge 15 reps   Straight Leg Raise 10 reps   Other Supine Lumbar Exercises Diagraphatic breathing 10x    Lumbar Exercises: Sidelying   Hip Abduction 15 reps   Lumbar Exercises: Prone   Straight Leg Raise 10 reps          PT Education - 01/31/14 1734    Education provided Yes   Education Details HEP for hastring, piriformis, quad, stretch and LTR   Person(s) Educated Patient   Methods Explanation;Demonstration;Handout   Comprehension Verbalized understanding;Returned demonstration          PT Short Term Goals - 01/31/14 1739    PT SHORT TERM GOAL #1   Title Patient will be independent with low level HEP for trunk and LE strengthening   Status On-going   PT SHORT TERM GOAL #2   Title patient will demonstrate increased abdomenal muscle strength of 4/5 MMT to improve trunk stability  Status On-going   PT SHORT TERM GOAL #3   Title Patient will demonstrate 3/5MMt hip abduction bilaterally indicating improved hip stability   Status On-going   PT SHORT TERM GOAL #4   Title Patient will demonstrate increased knee flexion strength of 4/5 MMT indicating improved knee stability and decreased buckling durign gait.    Status On-going            Plan - 01/31/14 1737    Clinical Impression Statement Initiated stretching program today, with pt reports of increased tightness on Rt > Lt side with all stretches.  Pt continued to require cueing for breathing techniques to avoid holding breath/valsalva during exercises.  Also, noted along with tightness of Rt side, weakness on Rt > Lt LE with SLR today requiring rest breaks to complete exercises.  Gave HEP for stretches and encouraged pt to complete everyday to increase  flexibility and dissociation of hips and low back.    PT Next Visit Plan Continue with POC progressing strengthing program and core stability program.    PT Home Exercise Plan Gave stretching program         Problem List There are no active problems to display for this patient.  Lonna Cobb, DPT [phone]

## 2014-02-02 ENCOUNTER — Ambulatory Visit (HOSPITAL_COMMUNITY)
Admission: RE | Admit: 2014-02-02 | Discharge: 2014-02-02 | Disposition: A | Discharge status: Home / Self Care | Payer: Medicare Other | Source: Ambulatory Visit | Attending: Rheumatology | Admitting: Rheumatology

## 2014-02-02 DIAGNOSIS — M6281 Muscle weakness (generalized): Secondary | ICD-10-CM

## 2014-02-02 DIAGNOSIS — M545 Low back pain, unspecified: Secondary | ICD-10-CM

## 2014-02-02 DIAGNOSIS — Z9181 History of falling: Secondary | ICD-10-CM

## 2014-02-02 DIAGNOSIS — M25552 Pain in left hip: Secondary | ICD-10-CM

## 2014-02-02 DIAGNOSIS — M5489 Other dorsalgia: Secondary | ICD-10-CM | POA: Diagnosis not present

## 2014-02-02 DIAGNOSIS — R29898 Other symptoms and signs involving the musculoskeletal system: Secondary | ICD-10-CM

## 2014-02-02 DIAGNOSIS — M25551 Pain in right hip: Secondary | ICD-10-CM

## 2014-02-02 NOTE — Therapy (Signed)
[facility] [place], [place], [address] Phone: [phone]   Fax:  [phone]  Physical Therapy Treatment  Patient Details  Name: Cheryl [name] MRN: [id number] Date of Birth: February 14, 1951  Encounter Date: 02/02/2014      PT End of Session - 02/02/14 1753    Visit Number 4   Number of Visits 16   Date for PT Re-Evaluation 02/24/14   Authorization Type UHC   Authorization - Visit Number 4   Authorization - Number of Visits 16   PT Start Time 1829   PT Stop Time 1647   PT Time Calculation (min) 42 min   Activity Tolerance Patient limited by fatigue   Behavior During Therapy [facility] for tasks assessed/performed      Past Medical History  Diagnosis Date  . Hypertension   . Thyroid disease     hypothyroidism  . Renal disorder     renal insufficiency  . Hypoaldosteronism   . AAA (abdominal aortic aneurysm)   . Arthritis     osteoarthritis  . Sarcoidosis   . Hyperlipidemia     Past Surgical History  Procedure Laterality Date  . Abdominal aortic aneurysm repair    . Breast biopsy    . Renal vein sampling    . Renal biopsy      There were no vitals taken for this visit.  Visit Diagnosis:  Midline low back pain without sciatica  Hip pain, bilateral  Risk for falls  Weakness of both hips  Weakness of both legs  Weakness of trunk musculature      Subjective Assessment - 02/02/14 1612    Symptoms Pt reports no complaints of pain in low back today, only in Rt > Lt hips.  Pt does report pain in the low back this morning, however she took her excedrin and this relieved her pain.     Currently in Pain? Yes   Pain Score 6    Pain Location Hip   Pain Orientation Right   Pain Descriptors / Indicators Aching          [facility] PT Assessment - 02/02/14 1610    Assessment   Medical Diagnosis hip and knee  weakness as well as decreased balance. lumbago   Onset Date 06/24/13   Next MD Visit Dr. Lenn Sink    Prior  Therapy no          [facility] Adult PT Treatment/Exercise - 02/02/14 1609    Exercises   Exercises Lumbar   Lumbar Exercises: Stretches   Active Hamstring Stretch 3 reps;30 seconds   Active Hamstring Stretch Limitations 12" Step   Passive Hamstring Stretch 3 reps;30 seconds   Passive Hamstring Stretch Limitations Gastroc Stretch, Slantboard   Quad Stretch 1 rep;20 seconds   Quad Stretch Limitations Prone with rope   ITB Stretch 1 rep;30 seconds   ITB Stretch Limitations on 6" box   Piriformis Stretch 2 reps;30 seconds   Piriformis Stretch Limitations Supine figure 4 with rope and PT assist   Lumbar Exercises: Supine   Bridge 20 reps   Straight Leg Raise 15 reps   Lumbar Exercises: Sidelying   Hip Abduction 20 reps  2x10   Other Sidelying Lumbar Exercises Hip Adduction x5   Modalities   Modalities Ultrasound   Ultrasound   Ultrasound Location Rt hip/piriformis insertion   Ultrasound Parameters 1 mHz, 1.5 w/cm2, 8'   Manual Therapy   Manual Therapy Other (comment)   Other Manual Therapy SIJ  assessed, no MET required            PT Short Term Goals - 02/02/14 1758    PT SHORT TERM GOAL #1   Title Patient will be independent with low level HEP for trunk and LE strengthening   Status On-going   PT SHORT TERM GOAL #2   Title patient will demonstrate increased abdomenal muscle strength of 4/5 MMT to improve trunk stability   Status On-going   PT SHORT TERM GOAL #3   Title Patient will demonstrate 3/5MMt hip abduction bilaterally indicating improved hip stability   Status On-going   PT SHORT TERM GOAL #4   Title Patient will demonstrate increased knee flexion strength of 4/5 MMT indicating improved knee stability and decreased buckling during gait.    Status On-going            Plan - 02/02/14 1754    Clinical Impression Statement Pt presented to clinic with reports of pain in (B) hip > low back; tenderness with palpation reported in hip bursa and along piriformis  insertion.  Pt does report a hx of hip bursitis for which she recieved injections, though these never lasted very long.  Attempted [place] treatment today along Rt trochanteric bursa and piriformis insertion/muscle to relieve pain; will assess next visit.  Assessed SIJ alignment today secondary to hx of bursitis, though no significant deviations noted; recommend continued checking of SIJ to ensure bursitis is not coming from malalignement of pelvis.  Exercise program focused today on decreasing stress to lateral hip joint to decrease pain from bursitis/piriformis pain.   Pt continues to be easily fatigued with exercises requiring rest breaks or sets of 5 to complete exercises.    Pt will benefit from skilled therapeutic intervention in order to improve on the following deficits Abnormal gait;Pain;Decreased strength;Difficulty walking;Decreased balance;Improper body mechanics;Postural dysfunction;Decreased endurance   PT Frequency 2x / week   PT Duration 8 weeks   PT Next Visit Plan Assess [place] treatment for pain reduction.  Check SIJ alignment.  Pt may benefit from myofacial release techniques to piriformis or ITB is pain continues.           Problem List There are no active problems to display for this patient.  Lonna Cobb, DPT [phone]

## 2014-02-03 ENCOUNTER — Encounter (HOSPITAL_COMMUNITY): Payer: Self-pay | Admitting: Physical Therapy

## 2014-02-07 ENCOUNTER — Ambulatory Visit (HOSPITAL_COMMUNITY)
Admission: RE | Admit: 2014-02-07 | Discharge: 2014-02-07 | Disposition: A | Discharge status: Home / Self Care | Payer: Medicare Other | Source: Ambulatory Visit | Attending: Rheumatology | Admitting: Rheumatology

## 2014-02-07 DIAGNOSIS — M545 Low back pain, unspecified: Secondary | ICD-10-CM

## 2014-02-07 DIAGNOSIS — M6281 Muscle weakness (generalized): Secondary | ICD-10-CM

## 2014-02-07 DIAGNOSIS — M25551 Pain in right hip: Secondary | ICD-10-CM

## 2014-02-07 DIAGNOSIS — M25552 Pain in left hip: Secondary | ICD-10-CM

## 2014-02-07 DIAGNOSIS — R29898 Other symptoms and signs involving the musculoskeletal system: Secondary | ICD-10-CM

## 2014-02-07 DIAGNOSIS — M5489 Other dorsalgia: Secondary | ICD-10-CM | POA: Diagnosis not present

## 2014-02-07 DIAGNOSIS — Z9181 History of falling: Secondary | ICD-10-CM

## 2014-02-07 NOTE — Therapy (Deleted)
[facility] [place], [place], [address] Phone: [phone]   Fax:  [phone]  Physical Therapy Treatment  Patient Details  Name: Cheryl [name] MRN: [id number] Date of Birth: [birth year]  Encounter Date: 02/07/2014      PT End of Session - 02/07/14 1632    Visit Number 5   Number of Visits 16   Date for PT Re-Evaluation 02/24/14   Authorization Type UHC   Authorization - Visit Number 5   Authorization - Number of Visits 16   PT Start Time [id number]   PT Stop Time 1601   PT Time Calculation (min) 40 min   Activity Tolerance Patient tolerated treatment well      Past Medical History  Diagnosis Date  . Hypertension   . Thyroid disease     hypothyroidism  . Renal disorder     renal insufficiency  . Hypoaldosteronism   . AAA (abdominal aortic aneurysm)   . Arthritis     osteoarthritis  . Sarcoidosis   . Hyperlipidemia     Past Surgical History  Procedure Laterality Date  . Abdominal aortic aneurysm repair    . Breast biopsy    . Renal vein sampling    . Renal biopsy      There were no vitals taken for this visit.  Visit Diagnosis:  Midline low back pain without sciatica  Hip pain, bilateral  Risk for falls  Weakness of both hips  Weakness of both legs  Weakness of trunk musculature      Subjective Assessment - 02/07/14 1529    Symptoms Pt reports only mild pain in the low back at 2/10, though increased complaints of pain in (B) hips at 6/10.     Currently in Pain? Yes   Pain Score 6             [facility] Adult PT Treatment/Exercise - 02/07/14 1528    Exercises   Exercises Lumbar   Lumbar Exercises: Stretches   Active Hamstring Stretch 3 reps;30 seconds   Active Hamstring Stretch Limitations 12" Step   Passive Hamstring Stretch 3 reps;30 seconds   Passive Hamstring Stretch Limitations Gastroc Stretch, Slantboard   Quad Stretch 1 rep;30 seconds   Quad Stretch Limitations Prone with rope   ITB Stretch 2  reps;30 seconds   ITB Stretch Limitations on 6" box   Piriformis Stretch 2 reps;30 seconds   Piriformis Stretch Limitations Supine figure 4   Lumbar Exercises: Supine   Bridge 5 seconds;10 reps   Bridge Limitations arms crossed   Lumbar Exercises: Sidelying   Hip Abduction 20 reps  2x10   Hip Abduction Limitations 1# Lt, 0# Rt   Other Sidelying Lumbar Exercises Hip Adduction 2x10   Lumbar Exercises: Prone   Straight Leg Raise 15 reps  3x5   Straight Leg Raises Limitations 1# Lt, 0# Rt   Modalities   Modalities Ultrasound   Ultrasound   Ultrasound Location Rt hip/piriformis    Ultrasound Parameters 3 mHz, 1.5 W/cm2   Ultrasound Goals Pain            PT Short Term Goals - 02/07/14 1634    PT SHORT TERM GOAL #1   Title Patient will be independent with low level HEP for trunk and LE strengthening   Status On-going   PT SHORT TERM GOAL #2   Title patient will demonstrate increased abdomenal muscle strength of 4/5 MMT to improve trunk stability   Status On-going   PT  SHORT TERM GOAL #3   Title Patient will demonstrate 3/5MMt hip abduction bilaterally indicating improved hip stability   Status On-going   PT SHORT TERM GOAL #4   Title Patient will demonstrate increased knee flexion strength of 4/5 MMT indicating improved knee stability and decreased buckling during gait.    Status On-going          PT Long Term Goals - 02/07/14 1635    PT LONG TERM GOAL #1   Title Patient will be independent with advanced HEP for trunk and LE strengthening   Status On-going   PT LONG TERM GOAL #2   Title patient will demonstrate increased abdomenal muscle strength of 5/5 MMT to improve trunk stability   Status On-going   PT LONG TERM GOAL #3   Title Patient will demonstrate 4/5MMt hip extension bilaterally indicating improved hip stability   Status On-going   PT LONG TERM GOAL #4   Title Patient will demonstrate increased knee flexion strength of 4+/5 MMT indicating improved knee  stability and decreased buckling durign gait.    Status On-going          Plan - 02/07/14 1632    Clinical Impression Statement Pt continues to report complaints of hip pain > LBP possibly related to hx of trochanteric bursitis.  Pt reports some relief in pain after [place] treatment last visit through the next day.  Treatment continued to focus on hip stability to decrease stress to hip bursa.  Pt was able to tolerate weight to Lt LE today for hip strengthening, though continues to demonstrate weakness on the Rt side and was unable to tolerate weight.   Pt will benefit from skilled therapeutic intervention in order to improve on the following deficits Abnormal gait;Pain;Decreased strength;Difficulty walking;Decreased balance;Improper body mechanics;Postural dysfunction;Decreased endurance   Rehab Potential Good   PT Frequency 2x / week   PT Duration 8 weeks   PT Next Visit Plan Assess [place] treatment for pain reduction.  Check SIJ alignment.  Pt may benefit from myofacial release techniques to piriformis or ITB is pain continues.          Problem List There are no active problems to display for this patient.  Lonna Cobb, DPT [phone]

## 2014-02-07 NOTE — Therapy (Signed)
[facility] [place], [place], [address] Phone: [phone]   Fax:  [phone]  Physical Therapy Treatment  Patient Details  Name: Cheryl [name] MRN: [id number] Date of Birth: April 11, 1950  Encounter Date: 02/07/2014      PT End of Session - 02/07/14 1632    Visit Number 5   Number of Visits 16   Date for PT Re-Evaluation 02/24/14   Authorization Type UHC   Authorization - Visit Number 5   Authorization - Number of Visits 16   PT Start Time [id number]   PT Stop Time 1601   PT Time Calculation (min) 40 min   Activity Tolerance Patient tolerated treatment well      Past Medical History  Diagnosis Date  . Hypertension   . Thyroid disease     hypothyroidism  . Renal disorder     renal insufficiency  . Hypoaldosteronism   . AAA (abdominal aortic aneurysm)   . Arthritis     osteoarthritis  . Sarcoidosis   . Hyperlipidemia     Past Surgical History  Procedure Laterality Date  . Abdominal aortic aneurysm repair    . Breast biopsy    . Renal vein sampling    . Renal biopsy      There were no vitals taken for this visit.  Visit Diagnosis:  Midline low back pain without sciatica  Hip pain, bilateral  Risk for falls  Weakness of both hips  Weakness of both legs  Weakness of trunk musculature      Subjective Assessment - 02/07/14 1529    Symptoms Pt reports only mild pain in the low back at 2/10, though increased complaints of pain in (B) hips at 6/10.     Currently in Pain? Yes   Pain Score 6             [facility] Adult PT Treatment/Exercise - 02/07/14 1528    Exercises   Exercises Lumbar   Lumbar Exercises: Stretches   Active Hamstring Stretch 3 reps;30 seconds   Active Hamstring Stretch Limitations 12" Step   Passive Hamstring Stretch 3 reps;30 seconds   Passive Hamstring Stretch Limitations Gastroc Stretch, Slantboard   Quad Stretch 1 rep;30 seconds   Quad Stretch Limitations Prone with rope   ITB Stretch 2  reps;30 seconds   ITB Stretch Limitations on 6" box   Piriformis Stretch 2 reps;30 seconds   Piriformis Stretch Limitations Supine figure 4   Lumbar Exercises: Supine   Bridge 5 seconds;10 reps   Bridge Limitations arms crossed   Lumbar Exercises: Sidelying   Hip Abduction 20 reps  2x10   Hip Abduction Limitations 1# Lt, 0# Rt   Other Sidelying Lumbar Exercises Hip Adduction 2x10   Lumbar Exercises: Prone   Straight Leg Raise 15 reps  3x5   Straight Leg Raises Limitations 1# Lt, 0# Rt   Modalities   Modalities Ultrasound   Ultrasound   Ultrasound Location Rt hip/piriformis    Ultrasound Parameters 3 mHz, 1.5 W/cm2   Ultrasound Goals Pain            PT Short Term Goals - 02/07/14 1634    PT SHORT TERM GOAL #1   Title Patient will be independent with low level HEP for trunk and LE strengthening   Status On-going   PT SHORT TERM GOAL #2   Title patient will demonstrate increased abdomenal muscle strength of 4/5 MMT to improve trunk stability   Status On-going   PT  SHORT TERM GOAL #3   Title Patient will demonstrate 3/5MMt hip abduction bilaterally indicating improved hip stability   Status On-going   PT SHORT TERM GOAL #4   Title Patient will demonstrate increased knee flexion strength of 4/5 MMT indicating improved knee stability and decreased buckling during gait.    Status On-going          PT Long Term Goals - 02/07/14 1635    PT LONG TERM GOAL #1   Title Patient will be independent with advanced HEP for trunk and LE strengthening   Status On-going   PT LONG TERM GOAL #2   Title patient will demonstrate increased abdomenal muscle strength of 5/5 MMT to improve trunk stability   Status On-going   PT LONG TERM GOAL #3   Title Patient will demonstrate 4/5MMt hip extension bilaterally indicating improved hip stability   Status On-going   PT LONG TERM GOAL #4   Title Patient will demonstrate increased knee flexion strength of 4+/5 MMT indicating improved knee  stability and decreased buckling durign gait.    Status On-going          Plan - 02/07/14 1751    Clinical Impression Statement Pt continues to report complaints of hip pain > LBP possibly related to hx of trochanteric bursitis. Pt reports some relief in pain after [place] treatment last visit through the next day. Treatment continued to focus on hip stability to decrease stress to hip bursa. Pt was able to tolerate weight to Lt LE today for hip strengthening, though continues to demonstrate weakness on the Rt side and was unable to tolerate weight.   Pt will benefit from skilled therapeutic intervention in order to improve on the following deficits Abnormal gait;Pain;Decreased strength;Difficulty walking;Decreased balance;Improper body mechanics;Postural dysfunction;Decreased endurance   Rehab Potential Good   PT Frequency 2x / week   PT Duration 8 weeks   PT Next Visit Plan Increase to 14" for HS stretch and add monster walks. Assess [place] treatment for pain reduction.  Check SIJ alignment.  Pt may benefit from myofacial release techniques to piriformis or ITB is pain continues.          Problem List There are no active problems to display for this patient.  Lonna Cobb, DPT [phone]

## 2014-02-07 NOTE — Addendum Note (Signed)
Encounter addended by: Lonna Cobb, PT on: 02/07/2014  5:52 PM<BR>     Documentation filed: Clinical Notes, Flowsheet VN

## 2014-02-07 NOTE — Addendum Note (Signed)
Encounter addended by: Lonna Cobb, PT on: 02/07/2014  5:53 PM<BR>     Documentation filed: Clinical Notes

## 2014-02-10 ENCOUNTER — Ambulatory Visit (HOSPITAL_COMMUNITY)
Admission: RE | Admit: 2014-02-10 | Discharge: 2014-02-10 | Disposition: A | Discharge status: Home / Self Care | Payer: Medicare Other | Source: Ambulatory Visit | Attending: Rheumatology | Admitting: Rheumatology

## 2014-02-10 DIAGNOSIS — R29898 Other symptoms and signs involving the musculoskeletal system: Secondary | ICD-10-CM

## 2014-02-10 DIAGNOSIS — M545 Low back pain, unspecified: Secondary | ICD-10-CM

## 2014-02-10 DIAGNOSIS — M25552 Pain in left hip: Secondary | ICD-10-CM

## 2014-02-10 DIAGNOSIS — M25551 Pain in right hip: Secondary | ICD-10-CM

## 2014-02-10 DIAGNOSIS — M5489 Other dorsalgia: Secondary | ICD-10-CM | POA: Diagnosis not present

## 2014-02-10 DIAGNOSIS — Z9181 History of falling: Secondary | ICD-10-CM

## 2014-02-10 DIAGNOSIS — M6281 Muscle weakness (generalized): Secondary | ICD-10-CM

## 2014-02-10 NOTE — Therapy (Signed)
[place], [place], [address] Phone: [phone]   Fax:  [phone]  Physical Therapy Treatment  Patient Details  Name: Cheryl [name] MRN: [id number] Date of Birth: 1950/10/18  Encounter Date: 02/10/2014      PT End of Session - 02/10/14 1619    Visit Number 6   Number of Visits 16   Date for PT Re-Evaluation 02/24/14   Authorization Type UHC   Authorization - Visit Number 6   Authorization - Number of Visits 16   PT Start Time [id number]   PT Stop Time 1658   PT Time Calculation (min) 46 min   Activity Tolerance Patient tolerated treatment well   Behavior During Therapy [facility] for tasks assessed/performed      Past Medical History  Diagnosis Date  . Hypertension   . Thyroid disease     hypothyroidism  . Renal disorder     renal insufficiency  . Hypoaldosteronism   . AAA (abdominal aortic aneurysm)   . Arthritis     osteoarthritis  . Sarcoidosis   . Hyperlipidemia     Past Surgical History  Procedure Laterality Date  . Abdominal aortic aneurysm repair    . Breast biopsy    . Renal vein sampling    . Renal biopsy      There were no vitals taken for this visit.  Visit Diagnosis:  Midline low back pain without sciatica  Hip pain, bilateral  Risk for falls  Weakness of both hips  Weakness of both legs  Weakness of trunk musculature      Subjective Assessment - 02/10/14 1616    Symptoms Pt stated mild pain today with pain medications.  Reports relief for the rest of the day following [place]   Currently in Pain? No/denies                    [facility] Adult PT Treatment/Exercise - 02/10/14 1646    Exercises   Exercises Lumbar   Lumbar Exercises: Stretches   Active Hamstring Stretch 3 reps;30 seconds   Active Hamstring Stretch Limitations 14in step   Passive Hamstring Stretch 3 reps;30 seconds   Passive Hamstring Stretch Limitations Gastroc Stretch, Slantboard   ITB Stretch 2  reps;30 seconds   ITB Stretch Limitations [name] 6" box   Piriformis Stretch 2 reps;30 seconds   Piriformis Stretch Limitations Supine figure 4   Lumbar Exercises: Supine   Ab Set 10 reps;5 seconds;Limitations   AB Set Limitations Multimodal cueing for correct pelvic floor contraction activation   Bent Knee Raise 10 reps;3 seconds   Bent Knee Raise Limitations tactile cueing for stability   Bridge 10 reps   Straight Leg Raise 10 reps   Straight Leg Raises Limitations slow with core sets   Manual Therapy   Manual Therapy Other (comment)   Other Manual Therapy muscle energy technique for Lt SI anterior f/b core strengthening                  PT Short Term Goals - 02/10/14 1625    PT SHORT TERM GOAL #1   Title Patient will be independent with low level HEP for trunk and LE strengthening   Status [name]-going   PT SHORT TERM GOAL #2   Title patient will demonstrate increased abdomenal muscle strength of 4/5 MMT to improve trunk stability   Status [name]-going   PT SHORT TERM GOAL #3   Title Patient will demonstrate 3/5MMt hip  abduction bilaterally indicating improved hip stability   Status [name]-going   PT SHORT TERM GOAL #4   Title Patient will demonstrate increased knee flexion strength of 4/5 MMT indicating improved knee stability and decreased buckling during gait.    PT SHORT TERM GOAL #5   Title Patient will be able to perform sit to stand 5x in <15 seconds to indicate patient not at high risk of falls.            PT Long Term Goals - 02/10/14 1700    PT LONG TERM GOAL #1   Title Patient will be independent with advanced HEP for trunk and LE strengthening   Status [name]-going   PT LONG TERM GOAL #2   Title patient will demonstrate increased abdomenal muscle strength of 5/5 MMT to improve trunk stability   Status [name]-going   PT LONG TERM GOAL #3   Title Patient will demonstrate 4/5MMt hip extension bilaterally indicating improved hip stability   PT LONG TERM GOAL #4   Title  Patient will demonstrate increased knee flexion strength of 4+/5 MMT indicating improved knee stability and decreased buckling durign gait.    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                Plan - 02/10/14 1701    Clinical Impression Statement Muscle energy technique complete for Lt SI anterior rotation followed by core strengthening exercises.  Tactile cueing required for stability to increase core strengthening with multimodal cueing required for proper pelvic floor contraction activation.  Pt educated [name] techniques and importance of core strength to keep SI alignment set.  Reported pain free at end of session, encouraged pt to stay hydrated to reduce risk of headache following manual techniuqes.     PT Next Visit Plan Next session check SI alignment, continue with core strengthening activities and LE stretches.  Begin monster walking.  Pt may benefit from myofascial release techniuqes to piriformis or ITB is pain continues.          Problem List There are no active problems to display for this patient.  Ihor Austin, [place] Aldona Lento 02/10/2014, 5:07 PM  [place] [address] [place], [place], [address] Phone: [phone]   Fax:  [phone]

## 2014-02-13 ENCOUNTER — Ambulatory Visit (HOSPITAL_COMMUNITY): Payer: Medicare Other | Admitting: Physical Therapy

## 2014-02-13 ENCOUNTER — Ambulatory Visit (HOSPITAL_COMMUNITY)
Admission: RE | Admit: 2014-02-13 | Discharge: 2014-02-13 | Disposition: A | Discharge status: Home / Self Care | Payer: Medicare Other | Source: Ambulatory Visit | Attending: Rheumatology | Admitting: Rheumatology

## 2014-02-13 DIAGNOSIS — M545 Low back pain, unspecified: Secondary | ICD-10-CM

## 2014-02-13 DIAGNOSIS — M6281 Muscle weakness (generalized): Secondary | ICD-10-CM

## 2014-02-13 DIAGNOSIS — R29898 Other symptoms and signs involving the musculoskeletal system: Secondary | ICD-10-CM

## 2014-02-13 DIAGNOSIS — M25552 Pain in left hip: Secondary | ICD-10-CM

## 2014-02-13 DIAGNOSIS — M5489 Other dorsalgia: Secondary | ICD-10-CM | POA: Diagnosis not present

## 2014-02-13 DIAGNOSIS — M25551 Pain in right hip: Secondary | ICD-10-CM

## 2014-02-13 DIAGNOSIS — Z9181 History of falling: Secondary | ICD-10-CM

## 2014-02-13 NOTE — Therapy (Signed)
[place], [place], [address] Phone: [phone]   Fax:  [phone]  Physical Therapy Treatment  Patient Details  Name: Cheryl [name] MRN: [id number] Date of Birth: [birth year]  Encounter Date: 02/13/2014      PT End of Session - 02/13/14 1624    Visit Number 7   Number of Visits 16   Date for PT Re-Evaluation 02/24/14   Authorization Type UHC medicare   Authorization - Visit Number 7   Authorization - Number of Visits 10   PT Start Time 3976   PT Stop Time 1517   PT Time Calculation (min) 42 min   Activity Tolerance Patient tolerated treatment well   Behavior During Therapy [facility] for tasks assessed/performed      Past Medical History  Diagnosis Date  . Hypertension   . Thyroid disease     hypothyroidism  . Renal disorder     renal insufficiency  . Hypoaldosteronism   . AAA (abdominal aortic aneurysm)   . Arthritis     osteoarthritis  . Sarcoidosis   . Hyperlipidemia     Past Surgical History  Procedure Laterality Date  . Abdominal aortic aneurysm repair    . Breast biopsy    . Renal vein sampling    . Renal biopsy      There were no vitals taken for this visit.  Visit Diagnosis:  Midline low back pain without sciatica  Hip pain, bilateral  Risk for falls  Weakness of both hips  Weakness of both legs  Weakness of trunk musculature      Subjective Assessment - 02/13/14 1453    Symptoms Pt states her pain today is 3/10. Reports bilateral hips hurt from bursitis as well as her knees.   Currently in Pain? Yes   Pain Score 3    Pain Location Hip   Pain Orientation Right                    [facility] Adult PT Treatment/Exercise - 02/13/14 1454    Exercises   Exercises Lumbar   Lumbar Exercises: Stretches   Active Hamstring Stretch 3 reps;30 seconds   Active Hamstring Stretch Limitations 14in step   Passive Hamstring Stretch 3 reps;30 seconds   Passive Hamstring Stretch  Limitations Gastroc Stretch, Slantboard   ITB Stretch 2 reps;30 seconds   ITB Stretch Limitations on 6" box   Piriformis Stretch 2 reps;30 seconds   Piriformis Stretch Limitations seated   Lumbar Exercises: Supine   Ab Set 10 reps;5 seconds;Limitations   AB Set Limitations Multimodal cueing for correct pelvic floor contraction activation   Bent Knee Raise 10 reps;3 seconds   Bent Knee Raise Limitations tactile cueing for stability   Bridge 10 reps   Straight Leg Raise 10 reps   Straight Leg Raises Limitations slow with core sets   Manual Therapy   Manual Therapy Other (comment)   Other Manual Therapy MET for Rt anterior hip rotation                  PT Short Term Goals - 02/13/14 1525    PT SHORT TERM GOAL #1   Title Patient will be independent with low level HEP for trunk and LE strengthening   Time 4   Period Weeks   Status On-going   PT SHORT TERM GOAL #2   Title patient will demonstrate increased abdomenal muscle strength of 4/5 MMT to improve trunk stability  Baseline 4-/5 bilaterally   Time 4   Period Weeks   Status On-going   PT SHORT TERM GOAL #3   Title Patient will demonstrate 3/5MMt hip abduction bilaterally indicating improved hip stability   Baseline 2+/5 bilaterally   Time 4   Period Weeks   Status On-going   PT SHORT TERM GOAL #4   Title Patient will demonstrate increased knee flexion strength of 4/5 MMT indicating improved knee stability and decreased buckling during gait.    Baseline 4-/5 bilaterally   Time 4   Period Weeks   Status On-going   PT SHORT TERM GOAL #5   Title Patient will be able to perform sit to stand 5x in <15 seconds to indicate patient not at high risk of falls.    Baseline 26 seconds   Time 4   Period Weeks   Status On-going           PT Long Term Goals - 02/13/14 1526    PT LONG TERM GOAL #1   Title Patient will be independent with advanced HEP for trunk and LE strengthening   Time 8   Period Weeks   Status  On-going   PT LONG TERM GOAL #2   Title patient will demonstrate increased abdomenal muscle strength of 5/5 MMT to improve trunk stability   Time 8   Period Weeks   Status On-going   PT LONG TERM GOAL #3   Title Patient will demonstrate 4/5MMt hip extension bilaterally indicating improved hip stability   Baseline 2+/5 MMT   Time 8   Period Weeks   Status On-going   PT LONG TERM GOAL #4   Title Patient will demonstrate increased knee flexion strength of 4+/5 MMT indicating improved knee stability and decreased buckling durign gait.    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                Plan - 02/13/14 1620    Clinical Impression Statement Muscle energy technique completed for Rt SI anterior rotation with full correction and reduction in symptoms reported.  Treadmill followed MET.  Continued with core stability exercises with patient requiring multimodal cues for stabilization.    PT Next Visit Plan Next session check SI alignment, continue with core strengthening activities and LE stretches.  Begin monster walking.  Pt may benefit from myofascial release techniuqes to piriformis or ITB is pain continues.          Problem List There are no active problems to display for this patient.   Teena Irani, PTA/CLT [id number] 02/13/2014, 4:25 PM  [facility] [address] [place], [place], [address] Phone: [phone]   Fax:  [phone]

## 2014-02-15 ENCOUNTER — Ambulatory Visit (HOSPITAL_COMMUNITY)
Admission: RE | Admit: 2014-02-15 | Discharge: 2014-02-15 | Disposition: A | Discharge status: Home / Self Care | Payer: Medicare Other | Source: Ambulatory Visit | Attending: Rheumatology | Admitting: Rheumatology

## 2014-02-15 ENCOUNTER — Encounter (HOSPITAL_COMMUNITY): Payer: Self-pay

## 2014-02-15 DIAGNOSIS — R29898 Other symptoms and signs involving the musculoskeletal system: Secondary | ICD-10-CM

## 2014-02-15 DIAGNOSIS — M25552 Pain in left hip: Secondary | ICD-10-CM

## 2014-02-15 DIAGNOSIS — M545 Low back pain, unspecified: Secondary | ICD-10-CM

## 2014-02-15 DIAGNOSIS — Z9181 History of falling: Secondary | ICD-10-CM

## 2014-02-15 DIAGNOSIS — M5489 Other dorsalgia: Secondary | ICD-10-CM | POA: Diagnosis not present

## 2014-02-15 DIAGNOSIS — M6281 Muscle weakness (generalized): Secondary | ICD-10-CM

## 2014-02-15 DIAGNOSIS — M25551 Pain in right hip: Secondary | ICD-10-CM

## 2014-02-15 NOTE — Patient Instructions (Signed)
Muscle energy technique for Rt SI anterior rotation  Laying in door frame place Rightt foot on wall with Left leg extended through door frame.  Push right feel into doorframe while raising up Left straight leg raise.  Complete 3 reps for 10 second holds.     Core strengthening Tighten stomach muscles and hold for 5-10 seconds, may complete in seated, supine and standing position.  Complete 10 reps 3x a day.

## 2014-02-15 NOTE — Therapy (Signed)
[place], [place], [address] Phone: [phone]   Fax:  [phone]  Physical Therapy Treatment  Patient Details  Name: Cheryl [name] MRN: [id number] Date of Birth: 09-22-1950  Encounter Date: 02/15/2014      PT End of Session - 02/15/14 1100    Visit Number 8   Number of Visits 16   Date for PT Re-Evaluation 02/24/14   Authorization Type UHC medicare   Authorization - Visit Number 8   Authorization - Number of Visits 10   PT Start Time 1022   PT Stop Time 1112   PT Time Calculation (min) 50 min   Activity Tolerance Patient tolerated treatment well   Behavior During Therapy [facility] for tasks assessed/performed      Past Medical History  Diagnosis Date  . Hypertension   . Thyroid disease     hypothyroidism  . Renal disorder     renal insufficiency  . Hypoaldosteronism   . AAA (abdominal aortic aneurysm)   . Arthritis     osteoarthritis  . Sarcoidosis   . Hyperlipidemia     Past Surgical History  Procedure Laterality Date  . Abdominal aortic aneurysm repair    . Breast biopsy    . Renal vein sampling    . Renal biopsy      There were no vitals taken for this visit.  Visit Diagnosis:  Midline low back pain without sciatica  Hip pain, bilateral  Risk for falls  Weakness of both hips  Weakness of both legs  Weakness of trunk musculature      Subjective Assessment - 02/15/14 1037    Symptoms Pt stated she has been compliant with HEP "sometimes", reports she has decreased frequency over holiday break.  Current pain scale 5-6/10 lower back right side.   Currently in Pain? Yes   Pain Score 6    Pain Location Back   Pain Orientation Lower;Right           [facility] Adult PT Treatment/Exercise - 02/15/14 1053    Exercises   Exercises Lumbar   Lumbar Exercises: Stretches   Hip Flexor Stretch 2 reps;20 seconds   Hip Flexor Stretch Limitations Thomas stretch on edge of mat   Piriformis  Stretch 3 reps;30 seconds   Lumbar Exercises: Aerobic   Tread Mill 5' @ 1.5 incline 3 following MET   Lumbar Exercises: Standing   Other Standing Lumbar Exercises 3D Hip Excursion x10   Other Standing Lumbar Exercises monster walk 1RT red tband   Lumbar Exercises: Supine   Ab Set 10 reps;5 seconds;Limitations   AB Set Limitations Multimodal cueing for correct pelvic floor contraction activation   Bent Knee Raise 10 reps;3 seconds   Bent Knee Raise Limitations tactile cueing for stability   Dead Bug 10 reps;5 seconds   Bridge 5 reps   Bridge Limitations Rt LE only   Straight Leg Raise 10 reps   Straight Leg Raises Limitations Lt LE only   Manual Therapy   Manual Therapy Other (comment)   Other Manual Therapy Muscle energy technique for Rt SI anterior rotation f/b treadmill and core strengthening exercises            PT Short Term Goals - 02/15/14 1119    PT SHORT TERM GOAL #1   Title Patient will be independent with low level HEP for trunk and LE strengthening   Status On-going   PT SHORT TERM GOAL #2   Title patient  will demonstrate increased abdomenal muscle strength of 4/5 MMT to improve trunk stability   Status On-going   PT SHORT TERM GOAL #3   Title Patient will demonstrate 3/5MMt hip abduction bilaterally indicating improved hip stability   Status On-going   PT SHORT TERM GOAL #4   Title Patient will demonstrate increased knee flexion strength of 4/5 MMT indicating improved knee stability and decreased buckling during gait.    Status On-going   PT SHORT TERM GOAL #5   Title Patient will be able to perform sit to stand 5x in <15 seconds to indicate patient not at high risk of falls.    Status On-going           PT Long Term Goals - 02/15/14 1119    PT LONG TERM GOAL #1   Title Patient will be independent with advanced HEP for trunk and LE strengthening   PT LONG TERM GOAL #2   Title patient will demonstrate increased abdomenal muscle strength of 5/5 MMT to  improve trunk stability   PT LONG TERM GOAL #3   Title Patient will demonstrate 4/5MMt hip extension bilaterally indicating improved hip stability   PT LONG TERM GOAL #4   Title Patient will demonstrate increased knee flexion strength of 4+/5 MMT indicating improved knee stability and decreased buckling durign gait.    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                Plan - 02/15/14 1101    Clinical Impression Statement Muscle energy technique complete for Rt SI anterior rotation with eliminiation of pain following.  Pt instructed techniuqes for MET at home, given HEP with written instructions for technique and pt stated verbalized understanding.  Session focus on improving core strengthening with tactile and verbal  cueing for stabiltiy.  Added monster walking for glut med strengthening with min cueing for posture and to reduce hip ER during exercise.  Pt reported pain free at end of session.     PT Next Visit Plan Next session check SI alignment, continue with core strengthening activities and LE stretches.  Pt may benefit from myofascial release techniuqes to piriformis or ITB is pain continues.     PT Home Exercise Plan Given core strengthening and MET HEP.        Problem List There are no active problems to display for this patient.  Ihor Austin, [place]  Aldona Lento 02/15/2014, 11:23 AM  [facility] [place], [place], [address] Phone: [phone]   Fax:  [phone]

## 2014-02-21 ENCOUNTER — Ambulatory Visit (HOSPITAL_COMMUNITY)
Admission: RE | Admit: 2014-02-21 | Discharge: 2014-02-21 | Disposition: A | Discharge status: Home / Self Care | Payer: Medicare Other | Source: Ambulatory Visit | Attending: Rheumatology | Admitting: Rheumatology

## 2014-02-21 DIAGNOSIS — Z9181 History of falling: Secondary | ICD-10-CM

## 2014-02-21 DIAGNOSIS — M6281 Muscle weakness (generalized): Secondary | ICD-10-CM

## 2014-02-21 DIAGNOSIS — M5489 Other dorsalgia: Secondary | ICD-10-CM | POA: Diagnosis not present

## 2014-02-21 DIAGNOSIS — M545 Low back pain, unspecified: Secondary | ICD-10-CM

## 2014-02-21 DIAGNOSIS — M25552 Pain in left hip: Secondary | ICD-10-CM

## 2014-02-21 DIAGNOSIS — R29898 Other symptoms and signs involving the musculoskeletal system: Secondary | ICD-10-CM

## 2014-02-21 DIAGNOSIS — M25551 Pain in right hip: Secondary | ICD-10-CM

## 2014-02-21 NOTE — Therapy (Signed)
[place], [place], [address] Phone: [phone]   Fax:  [phone]  Physical Therapy Treatment  Patient Details  Name: Cheryl [name] MRN: [id number] Date of Birth: Sep 03, 1950  Encounter Date: 02/21/2014      PT End of Session - 02/21/14 1404    Visit Number 9   Number of Visits 16   Date for PT Re-Evaluation 02/24/14   Authorization Type UHC medicare   Authorization - Visit Number 9   Authorization - Number of Visits 10   PT Start Time 1350   PT Stop Time 1430   PT Time Calculation (min) 40 min   Activity Tolerance Patient tolerated treatment well   Behavior During Therapy [facility] for tasks assessed/performed      Past Medical History  Diagnosis Date  . Hypertension   . Thyroid disease     hypothyroidism  . Renal disorder     renal insufficiency  . Hypoaldosteronism   . AAA (abdominal aortic aneurysm)   . Arthritis     osteoarthritis  . Sarcoidosis   . Hyperlipidemia     Past Surgical History  Procedure Laterality Date  . Abdominal aortic aneurysm repair    . Breast biopsy    . Renal vein sampling    . Renal biopsy      There were no vitals taken for this visit.  Visit Diagnosis:  Midline low back pain without sciatica  Hip pain, bilateral  Risk for falls  Weakness of both hips  Weakness of both legs  Weakness of trunk musculature      Subjective Assessment - 02/21/14 1354    Symptoms I feel like i'm getting better but my pain is a 5/10.    Currently in Pain? Yes   Pain Score 5    Pain Location Back   Pain Orientation Right;Lower   Pain Type Chronic pain                    [facility] Adult PT Treatment/Exercise - 02/21/14 0001    Lumbar Exercises: Stretches   Active Hamstring Stretch 3 reps;30 seconds   Active Hamstring Stretch Limitations 14in step   Passive Hamstring Stretch 3 reps;30 seconds   Passive Hamstring Stretch Limitations Gastroc Stretch, Slantboard   Hip Flexor Stretch 2 reps;20 seconds   Hip Flexor Stretch Limitations Thomas stretch on edge of mat   Piriformis Stretch 3 reps;30 seconds   Lumbar Exercises: Standing   Heel Raises 1 second;20 reps   Functional Squats 10 reps   Functional Squats Limitations 3lb weights with anterior reach   Forward Lunge 10 reps   Forward Lunge Limitations 6"   Side Lunge 10 reps   Side Lunge Limitations 6" box   Other Standing Lumbar Exercises 3D Hip Excursion x10   Other Standing Lumbar Exercises monster walk and side step 1RT red tband   Lumbar Exercises: Supine   Bent Knee Raise 10 reps;3 seconds   Bent Knee Raise Limitations tactile cueing for stability   Bridge 5 reps   Bridge Limitations Rt LE only   Straight Leg Raise 10 reps   Straight Leg Raises Limitations Lt LE only   Manual Therapy   Other Manual Therapy MET for Rt anterior hip rotation                PT Education - 02/21/14 1431    Education provided Yes   Education Details HEP: lunging and squats.  Person(s) Educated Patient   Methods Explanation;Demonstration;Handout   Comprehension Verbalized understanding;Returned demonstration          PT Short Term Goals - 02/15/14 1119    PT SHORT TERM GOAL #1   Title Patient will be independent with low level HEP for trunk and LE strengthening   Status On-going   PT SHORT TERM GOAL #2   Title patient will demonstrate increased abdomenal muscle strength of 4/5 MMT to improve trunk stability   Status On-going   PT SHORT TERM GOAL #3   Title Patient will demonstrate 3/5MMt hip abduction bilaterally indicating improved hip stability   Status On-going   PT SHORT TERM GOAL #4   Title Patient will demonstrate increased knee flexion strength of 4/5 MMT indicating improved knee stability and decreased buckling during gait.    Status On-going   PT SHORT TERM GOAL #5   Title Patient will be able to perform sit to stand 5x in <15 seconds to indicate patient not at high risk of  falls.    Status On-going           PT Long Term Goals - 02/15/14 1119    PT LONG TERM GOAL #1   Title Patient will be independent with advanced HEP for trunk and LE strengthening   PT LONG TERM GOAL #2   Title patient will demonstrate increased abdomenal muscle strength of 5/5 MMT to improve trunk stability   PT LONG TERM GOAL #3   Title Patient will demonstrate 4/5MMt hip extension bilaterally indicating improved hip stability   PT LONG TERM GOAL #4   Title Patient will demonstrate increased knee flexion strength of 4+/5 MMT indicating improved knee stability and decreased buckling durign gait.    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                Plan - 02/21/14 1427    Clinical Impression Statement "Adrine's" Session focused on progression to functional strengtheing for improved gait mechanics resulting in increased steadiness of gait and decreqased trendelenberg sign following lunging activities. HEP given with lunges and sqats as patient will neot be seen for the next 7 days [name] to holiday. Noted decreasd pain at end of session.    PT Next Visit Plan Next session check SI alignment, continue with core strengthening activities and LE stretches. Progress squaqt anterior reach to squat reach matrix and continue education on lunge matrix to 6" box by adding transverse plane.         Problem List There are no active problems to display for this patient.  Devona Konig PT DPT [facility] [address] [place], [place], [address] Phone: [phone]   Fax:  [phone]

## 2014-02-27 ENCOUNTER — Ambulatory Visit (HOSPITAL_COMMUNITY)
Admission: RE | Admit: 2014-02-27 | Discharge: 2014-02-27 | Disposition: A | Discharge status: Home / Self Care | Payer: Medicare Other | Source: Ambulatory Visit | Attending: Endocrinology | Admitting: Endocrinology

## 2014-02-27 DIAGNOSIS — M545 Low back pain, unspecified: Secondary | ICD-10-CM

## 2014-02-27 DIAGNOSIS — R531 Weakness: Secondary | ICD-10-CM | POA: Insufficient documentation

## 2014-02-27 DIAGNOSIS — M5489 Other dorsalgia: Secondary | ICD-10-CM | POA: Diagnosis not present

## 2014-02-27 DIAGNOSIS — Z9181 History of falling: Secondary | ICD-10-CM | POA: Diagnosis not present

## 2014-02-27 DIAGNOSIS — R29898 Other symptoms and signs involving the musculoskeletal system: Secondary | ICD-10-CM

## 2014-02-27 DIAGNOSIS — M25551 Pain in right hip: Secondary | ICD-10-CM

## 2014-02-27 DIAGNOSIS — M6281 Muscle weakness (generalized): Secondary | ICD-10-CM

## 2014-02-27 DIAGNOSIS — M25552 Pain in left hip: Secondary | ICD-10-CM | POA: Diagnosis not present

## 2014-02-27 NOTE — Therapy (Signed)
[place], [place], [address] Phone: [phone]   Fax:  [phone]  Physical Therapy Re-Evaluation/Treatment  Patient Details  Name: Cheryl [name] MRN: [id number] Date of Birth: 09-Sep-1950  Encounter Date: 02/27/2014      PT End of Session - 02/27/14 1307    Visit Number 10   Number of Visits 18   Date for PT Re-Evaluation 03/29/14   Authorization Type [employer] medicare   Authorization Time Period G-code updated visit #10   Authorization - Visit Number 10   Authorization - Number of Visits 18   PT Start Time 1017   PT Stop Time 1107   PT Time Calculation (min) 50 min   Activity Tolerance Patient tolerated treatment well   Behavior During Therapy [facility] for tasks assessed/performed      Past Medical History  Diagnosis Date  . Hypertension   . Thyroid disease     hypothyroidism  . Renal disorder     renal insufficiency  . Hypoaldosteronism   . AAA (abdominal aortic aneurysm)   . Arthritis     osteoarthritis  . Sarcoidosis   . Hyperlipidemia     Past Surgical History  Procedure Laterality Date  . Abdominal aortic aneurysm repair    . Breast biopsy    . Renal vein sampling    . Renal biopsy      There were no vitals taken for this visit.  Visit Diagnosis:  Midline low back pain without sciatica  Hip pain, bilateral  Risk for falls  Weakness of both hips  Weakness of both legs  Weakness of trunk musculature      Subjective Assessment - 02/27/14 1023    Symptoms Pt reprots decreasing complaints of pain into the low back to only 2-3 days a week, with most pain reported in the Rt > Lt hip from trochanteric bursitis everyday.    How long can you walk comfortably? 1 hour, stopped secondary to pain in hips > back   Currently in Pain? Yes   Pain Score 3   Best - 3/10, Worst - 6/10 Rt > Lt side   Pain Location Back   Pain Orientation Right;Lower          [facility] PT Assessment - 02/27/14 1021     Assessment   Medical Diagnosis hip and knee  weakness as well as decreased balance. lumbago   Onset Date 06/24/13   Next MD Visit Dr. Lenn Sink    Observation/Other Assessments   Focus on Therapeutic Outcomes (FOTO)  41% Limited (was 50% limited)   Strength   Right Hip Flexion 4/5  was 4-/5   Right Hip Extension --  4-/5 (was 2+/5)   Right Hip ABduction --  4-/5 (was 2+/5)   Right Hip ADduction 3+/5  was 3+/5   Left Hip Flexion 4/5  was 4-/5   Left Hip Extension 3+/5  was 2+/5   Left Hip ABduction 3+/5  was 2+/5   Left Hip ADduction 3+/5  was 3+/5   Right Knee Flexion 4/5  was 4-/5   Right Knee Extension 5/5  was 4/5   Left Knee Flexion 4/5  was 4-/5   Left Knee Extension 5/5  was 4/5   Right Ankle Dorsiflexion 5/5  was 5/5   Left Ankle Dorsiflexion 5/5  was 5/5   Lumbar Flexion 4/5  *tested in sitting without UE/LE support (was 4-/5)   Lumbar Extension 4/5  *tested in sitting without  UE/LE support (was 4-/5)   Flexibility   Soft Tissue Assessment /Muscle Lenght yes   Hamstrings Rt 103, Lt 108 degrees   Quadriceps Rt/Lt to glute                  [facility] Adult PT Treatment/Exercise - 02/27/14 1020    Exercises   Exercises Lumbar   Lumbar Exercises: Stretches   Active Hamstring Stretch 2 reps;20 seconds   Active Hamstring Stretch Limitations 3 Way on 14" Step   Passive Hamstring Stretch 3 reps;30 seconds   Passive Hamstring Stretch Limitations Gastroc Stretch, Slantboard   Hip Flexor Stretch 2 reps;30 seconds   Hip Flexor Stretch Limitations Thomas stretch on edge of mat   ITB Stretch 2 reps;30 seconds   ITB Stretch Limitations on 8" box   Lumbar Exercises: Supine   Bridge 5 reps   Bridge Limitations 10" hold   Lumbar Exercises: Prone   Straight Leg Raise 20 reps  2x10   Straight Leg Raises Limitations 1#   Other Prone Lumbar Exercises HS Curls 1# x10 each   Lumbar Exercises: Quadruped   Single Arm Raise 10 reps   Straight Leg Raise 10  reps   Manual Therapy   Manual Therapy Joint mobilization   Joint Mobilization SIJ assessed, No MET required today                  PT Short Term Goals - 02/27/14 1301    PT SHORT TERM GOAL #1   Title Patient will be independent with low level HEP for trunk and LE strengthening   Status Achieved   PT SHORT TERM GOAL #2   Title patient will demonstrate increased abdomenal muscle strength of 4/5 MMT to improve trunk stability   Status Achieved   PT SHORT TERM GOAL #3   Title Patient will demonstrate 3/5MMt hip abduction bilaterally indicating improved hip stability   Baseline Rt 4-/5, Lt 3+/5   Status Achieved   PT SHORT TERM GOAL #4   Title Patient will demonstrate increased knee flexion strength of 4/5 MMT indicating improved knee stability and decreased buckling during gait.    Baseline Rt/Lt 4/5   Status Achieved           PT Long Term Goals - 02/27/14 1306    PT LONG TERM GOAL #2   Status On-going   PT LONG TERM GOAL #3   Title Patient will demonstrate 4/5MMt hip extension bilaterally indicating improved hip stability   Baseline Rt 4-/5, Lt 3+/5   Status On-going   PT LONG TERM GOAL #4   Title Patient will demonstrate increased knee flexion strength of 4+/5 MMT indicating improved knee stability and decreased buckling durign gait.    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    Status On-going          Assessment : Re-Evaluation completed for "Cheryl [name]" with improvements noted in flexibilty, ROM, and strength. Pt is making excellent progress with therapy and has currenlty met 4/5 STG, and 0/5 LTG to date, and will progress towards LTG at this time. She reports decreasing pain in low back to only 2-3 times per week, though pain in hips seems to bother her more at this time; pt does report a hx of trochanteric bursitis which she attributes to the  hip pain. Recommend continued  PT 2x/wk for 4 weeks to progress into LTG and decrease stress to low back and hips for improved stability.       Plan - Mar 13, 2014 1313    PT Next Visit Plan check SIJ alignement.  Decrease reps of stretching, and focus on strengthening program.  Progress to squat reach matrix.           G-Codes - 03-13-2014 1311    Functional Assessment Tool Used FOTO   Functional Limitation Other PT primary   Other PT Primary Current Status (J2426) At least 40 percent but less than 60 percent impaired, limited or restricted   Other PT Primary Goal Status (S3419) At least 20 percent but less than 40 percent impaired, limited or restricted      Problem List There are no active problems to display for this patient.  Lonna Cobb, DPT [phone]  [place] [address] [place], [place], [address] Phone: [phone]   Fax:  [phone]

## 2014-02-28 DIAGNOSIS — M5489 Other dorsalgia: Secondary | ICD-10-CM | POA: Diagnosis not present

## 2014-02-28 NOTE — Therapy (Addendum)
[place], [place], [address] Phone: [phone]   Fax:  [phone]  Physical Therapy Evaluation  Patient Details  Name: Cheryl [name] MRN: [id number] Date of Birth: [birth year]  Encounter Date: 01/25/2014      PT End of Session - 02/27/14 1307    Visit Number 10   Number of Visits 18   Date for PT Re-Evaluation 03/29/14   Authorization Type [employer] medicare   Authorization Time Period G-code updated visit #10   Authorization - Visit Number 10   Authorization - Number of Visits 18   PT Start Time 1017   PT Stop Time 1107   PT Time Calculation (min) 50 min   Activity Tolerance Patient tolerated treatment well   Behavior During Therapy [facility] for tasks assessed/performed      Past Medical History  Diagnosis Date  . Hypertension   . Thyroid disease     hypothyroidism  . Renal disorder     renal insufficiency  . Hypoaldosteronism   . AAA (abdominal aortic aneurysm)   . Arthritis     osteoarthritis  . Sarcoidosis   . Hyperlipidemia     Past Surgical History  Procedure Laterality Date  . Abdominal aortic aneurysm repair    . Breast biopsy    . Renal vein sampling    . Renal biopsy      There were no vitals taken for this visit.  Visit Diagnosis:  Midline low back pain without sciatica  Hip pain, bilateral  Risk for falls  Weakness of both hips  Weakness of both legs  Weakness of trunk musculature      Subjective Assessment - 02/27/14 1023    Symptoms Pt reprots decreasing complaints of pain into the low back to only 2-3 days a week, with most pain reported in the Rt > Lt hip from trochanteric bursitis everyday.    How long can you walk comfortably? 1 hour, stopped secondary to pain in hips > back   Currently in Pain? Yes   Pain Score 3   Best - 3/10, Worst - 6/10 Rt > Lt side   Pain Location Back   Pain Orientation Right;Lower          [facility] PT Assessment - 02/27/14 1021     Assessment   Medical Diagnosis hip and knee  weakness as well as decreased balance. lumbago   Onset Date 06/24/13   Next MD Visit Dr. Lenn Sink    Observation/Other Assessments   Focus on Therapeutic Outcomes (FOTO)  41% Limited (was 50% limited)   Strength   Right Hip Flexion 4/5  was 4-/5   Right Hip Extension --  4-/5 (was 2+/5)   Right Hip ABduction --  4-/5 (was 2+/5)   Right Hip ADduction 3+/5  was 3+/5   Left Hip Flexion 4/5  was 4-/5   Left Hip Extension 3+/5  was 2+/5   Left Hip ABduction 3+/5  was 2+/5   Left Hip ADduction 3+/5  was 3+/5   Right Knee Flexion 4/5  was 4-/5   Right Knee Extension 5/5  was 4/5   Left Knee Flexion 4/5  was 4-/5   Left Knee Extension 5/5  was 4/5   Right Ankle Dorsiflexion 5/5  was 5/5   Left Ankle Dorsiflexion 5/5  was 5/5   Lumbar Flexion 4/5  *tested in sitting without UE/LE support (was 4-/5)   Lumbar Extension 4/5  *tested in sitting without  UE/LE support (was 4-/5)   Flexibility   Soft Tissue Assessment /Muscle Lenght yes   Hamstrings Rt 103, Lt 108 degrees   Quadriceps Rt/Lt to glute                  [facility] Adult PT Treatment/Exercise - 02/27/14 1020    Exercises   Exercises Lumbar   Lumbar Exercises: Stretches   Active Hamstring Stretch 2 reps;20 seconds   Active Hamstring Stretch Limitations 3 Way on 14" Step   Passive Hamstring Stretch 3 reps;30 seconds   Passive Hamstring Stretch Limitations Gastroc Stretch, Slantboard   Hip Flexor Stretch 2 reps;30 seconds   Hip Flexor Stretch Limitations Thomas stretch on edge of mat   ITB Stretch 2 reps;30 seconds   ITB Stretch Limitations on 8" box   Lumbar Exercises: Supine   Bridge 5 reps   Bridge Limitations 10" hold   Lumbar Exercises: Prone   Straight Leg Raise 20 reps  2x10   Straight Leg Raises Limitations 1#   Other Prone Lumbar Exercises HS Curls 1# x10 each   Lumbar Exercises: Quadruped   Single Arm Raise 10 reps   Straight Leg Raise 10  reps   Manual Therapy   Manual Therapy Joint mobilization   Joint Mobilization SIJ assessed, No MET required today                  PT Short Term Goals - 02/27/14 1301    PT SHORT TERM GOAL #1   Title Patient will be independent with low level HEP for trunk and LE strengthening   Status Achieved   PT SHORT TERM GOAL #2   Title patient will demonstrate increased abdomenal muscle strength of 4/5 MMT to improve trunk stability   Status Achieved   PT SHORT TERM GOAL #3   Title Patient will demonstrate 3/5MMt hip abduction bilaterally indicating improved hip stability   Baseline Rt 4-/5, Lt 3+/5   Status Achieved   PT SHORT TERM GOAL #4   Title Patient will demonstrate increased knee flexion strength of 4/5 MMT indicating improved knee stability and decreased buckling during gait.    Baseline Rt/Lt 4/5   Status Achieved           PT Long Term Goals - 02/27/14 1306    PT LONG TERM GOAL #2   Status On-going   PT LONG TERM GOAL #3   Title Patient will demonstrate 4/5MMt hip extension bilaterally indicating improved hip stability   Baseline Rt 4-/5, Lt 3+/5   Status On-going   PT LONG TERM GOAL #4   Title Patient will demonstrate increased knee flexion strength of 4+/5 MMT indicating improved knee stability and decreased buckling durign gait.    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    Status On-going               Plan - 02/27/14 1314    Clinical Impression Statement Re-Evaluation completed for "Cheryl [name]" with improvements noted in flexibilty, ROM, and strength. Pt is making excellent progress with therapy and has currenlty met 4/5 STG, and 0/5 LTG to date, and will progress towards LTG at this time. She reports decreasing pain in low back to only 2-3 times per week, though pain in hips seems to bother her more at this time; pt does report a hx  of  trochanteric bursitis which she attributes to the hip pain. Recommend continued PT 2x/wk for 4 weeks to progress into LTG and decrease stress to low back and hips for improved stability.    Pt will benefit from skilled therapeutic intervention in order to improve on the following deficits Abnormal gait;Pain;Decreased strength;Difficulty walking;Decreased balance;Improper body mechanics;Postural dysfunction;Decreased endurance   PT Frequency 2x / week   PT Duration 4 weeks   PT Next Visit Plan check SIJ alignement.  Decrease reps of stretching, and focus on strengthening program.  Progress to squat reach matrix.           G-Codes - 08-Feb-2014 1653    Functional Assessment Tool Used foto 50% limited   Functional Limitation Other PT primary   Other PT Primary Current Status (B3383) At least 40 percent but less than 60 percent impaired, limited or restricted   Other PT Primary Goal Status (A9191) At least 20 percent but less than 40 percent impaired, limited or restricted      Problem List There are no active problems to display for this patient.      G-Codes - 2015-02-10 1423    Functional Assessment Tool Used foto 50% limited   Functional Limitation Other PT primary   Other PT Primary Current Status (Y6060) At least 40 percent but less than 60 percent impaired, limited or restricted   Other PT Primary Goal Status (O4599) At least 20 percent but less than 40 percent impaired, limited or restricted      Leia Alf 02/28/2014, 4:53 PM  [place], [place], [address] Phone: [phone]   Fax:  [phone]

## 2014-02-28 NOTE — Addendum Note (Signed)
Encounter addended by: Leia Alf, PT on: 02/28/2014  5:10 PM<BR>     Documentation filed: Flowsheet VN, Clinical Notes

## 2014-03-01 ENCOUNTER — Ambulatory Visit (HOSPITAL_COMMUNITY)
Admission: RE | Admit: 2014-03-01 | Discharge: 2014-03-01 | Disposition: A | Discharge status: Home / Self Care | Payer: Medicare Other | Source: Ambulatory Visit | Attending: Rheumatology | Admitting: Rheumatology

## 2014-03-01 DIAGNOSIS — M545 Low back pain, unspecified: Secondary | ICD-10-CM

## 2014-03-01 DIAGNOSIS — M5489 Other dorsalgia: Secondary | ICD-10-CM | POA: Diagnosis not present

## 2014-03-01 DIAGNOSIS — M25551 Pain in right hip: Secondary | ICD-10-CM

## 2014-03-01 DIAGNOSIS — M25552 Pain in left hip: Secondary | ICD-10-CM

## 2014-03-01 DIAGNOSIS — R29898 Other symptoms and signs involving the musculoskeletal system: Secondary | ICD-10-CM

## 2014-03-01 DIAGNOSIS — M6281 Muscle weakness (generalized): Secondary | ICD-10-CM

## 2014-03-01 DIAGNOSIS — Z9181 History of falling: Secondary | ICD-10-CM

## 2014-03-01 NOTE — Therapy (Signed)
[place], [place], [address] Phone: [phone]   Fax:  [phone]  Physical Therapy Treatment  Patient Details  Name: HONESTI SEABERG MRN: [id number] Date of Birth: Jul 18, 1950  Encounter Date: 03/01/2014      PT End of Session - 03/01/14 1036    Visit Number 11   Number of Visits 18   Date for PT Re-Evaluation 03/29/14   Authorization Type [employer] medicare   Authorization Time Period G-code updated visit #10   Authorization - Visit Number 11   Authorization - Number of Visits 18   PT Start Time 4944   PT Stop Time 1103   PT Time Calculation (min) 40 min   Activity Tolerance Patient tolerated treatment well   Behavior During Therapy [facility] for tasks assessed/performed      Past Medical History  Diagnosis Date  . Hypertension   . Thyroid disease     hypothyroidism  . Renal disorder     renal insufficiency  . Hypoaldosteronism   . AAA (abdominal aortic aneurysm)   . Arthritis     osteoarthritis  . Sarcoidosis   . Hyperlipidemia     Past Surgical History  Procedure Laterality Date  . Abdominal aortic aneurysm repair    . Breast biopsy    . Renal vein sampling    . Renal biopsy      There were no vitals taken for this visit.  Visit Diagnosis:  Midline low back pain without sciatica  Hip pain, bilateral  Risk for falls  Weakness of both hips  Weakness of both legs  Weakness of trunk musculature      Subjective Assessment - 03/01/14 1030    Symptoms Pain free today, reports compliance with HEP minimum due to too busy.     Currently in Pain? No/denies          [facility] PT Assessment - 03/01/14 1032    Assessment   Medical Diagnosis hip and knee  weakness as well as decreased balance. lumbago   Onset Date 06/24/13   Next MD Visit Dr. Lenn Sink  March   Prior Therapy no                  [facility] Adult PT Treatment/Exercise - 03/01/14 1032    Exercises   Exercises Lumbar   Lumbar Exercises: Stretches   Active Hamstring Stretch 1 rep;30 seconds   Active Hamstring Stretch Limitations 3 way on 14 in step   Piriformis Stretch 2 reps;30 seconds   Piriformis Stretch Limitations seated    Lumbar Exercises: Standing   Functional Squats 5 reps;Limitations   Functional Squats Limitations squat reach matrix wtih 3# 5 sets   Forward Lunge 10 reps   Forward Lunge Limitations 6"   Side Lunge 10 reps   Side Lunge Limitations 6" box   Lumbar Exercises: Prone   Straight Leg Raise 20 reps  2x 10   Straight Leg Raises Limitations 1 1/2   Other Prone Lumbar Exercises HS Curls 1 1/2 # x10 each   Lumbar Exercises: Quadruped   Single Arm Raise 10 reps   Straight Leg Raise 10 reps   Manual Therapy   Manual Therapy Joint mobilization   Joint Mobilization SI within allignemnt, no MET required today                  PT Short Term Goals - 03/01/14 1446    PT SHORT TERM GOAL #1  Title Patient will be independent with low level HEP for trunk and LE strengthening   Status Achieved   PT SHORT TERM GOAL #2   Title patient will demonstrate increased abdomenal muscle strength of 4/5 MMT to improve trunk stability   Status Achieved   PT SHORT TERM GOAL #3   Title Patient will demonstrate 3/5MMt hip abduction bilaterally indicating improved hip stability   Status Achieved   PT SHORT TERM GOAL #4   Title Patient will demonstrate increased knee flexion strength of 4/5 MMT indicating improved knee stability and decreased buckling during gait.    Status Achieved   PT SHORT TERM GOAL #5   Title Patient will be able to perform sit to stand 5x in <15 seconds to indicate patient not at high risk of falls.    Status On-going           PT Long Term Goals - 03/01/14 1449    PT LONG TERM GOAL #1   Title Patient will be independent with advanced HEP for trunk and LE strengthening   Status On-going   PT LONG TERM GOAL #2   Title patient will demonstrate increased  abdomenal muscle strength of 5/5 MMT to improve trunk stability   Status On-going   PT LONG TERM GOAL #3   Title Patient will demonstrate 4/5MMt hip extension bilaterally indicating improved hip stability   Status On-going   PT LONG TERM GOAL #4   Title Patient will demonstrate increased knee flexion strength of 4+/5 MMT indicating improved knee stability and decreased buckling durign gait.    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    Status On-going               Plan - 03/01/14 1430    Clinical Impression Statement Pt overall core strength is imporving, SI remains in alignment, no muscle energy techniuqe required this session.  Session focus on core and gluteal strenghtening with therapist facilitation to improve form with quadruped exercises for maximum stabilty and strengthining.  No reports of pain through session, pt was limited by fatigue at end of sessoin.   PT Next Visit Plan check SIJ alignement.  Decrease reps of stretching, and focus on strengthening program.  Continue with squat reach matrix (be aware of Rt shoulder pain with weight lifting, no reoprts of pain this session).        Problem List There are no active problems to display for this patient.  Ihor Austin, Stewart  Aldona Lento 03/01/2014, 2:51 PM  [facility] [address] [place], [place], [address] Phone: [phone]   Fax:  [phone]

## 2014-03-03 DIAGNOSIS — M5489 Other dorsalgia: Secondary | ICD-10-CM | POA: Diagnosis not present

## 2014-03-03 NOTE — Addendum Note (Signed)
Encounter addended by: Leia Alf, PT on: 03/03/2014  2:26 PM<BR>     Documentation filed: Clinical Notes, Flowsheet VN

## 2014-03-06 ENCOUNTER — Ambulatory Visit (HOSPITAL_COMMUNITY)
Admission: RE | Admit: 2014-03-06 | Discharge: 2014-03-06 | Disposition: A | Discharge status: Home / Self Care | Payer: Medicare Other | Source: Ambulatory Visit | Attending: Rheumatology | Admitting: Rheumatology

## 2014-03-06 DIAGNOSIS — R29898 Other symptoms and signs involving the musculoskeletal system: Secondary | ICD-10-CM

## 2014-03-06 DIAGNOSIS — M6281 Muscle weakness (generalized): Secondary | ICD-10-CM

## 2014-03-06 DIAGNOSIS — M25551 Pain in right hip: Secondary | ICD-10-CM

## 2014-03-06 DIAGNOSIS — M5489 Other dorsalgia: Secondary | ICD-10-CM | POA: Diagnosis not present

## 2014-03-06 DIAGNOSIS — M545 Low back pain, unspecified: Secondary | ICD-10-CM

## 2014-03-06 DIAGNOSIS — M25552 Pain in left hip: Secondary | ICD-10-CM

## 2014-03-06 NOTE — Therapy (Signed)
[place], [place], [address] Phone: [phone]   Fax:  [phone]  Physical Therapy Treatment  Patient Details  Name: KEYONNA MCCALLISTER MRN: [id number] Date of Birth: 18-Aug-1950 Referring Provider:  Altheimer, Juanda Bond, MD  Encounter Date: 03/06/2014      PT End of Session - 03/06/14 1057    Visit Number 12   Number of Visits 18   Date for PT Re-Evaluation 03/29/14   Authorization Type [employer] medicare   Authorization Time Period G-code updated visit #10   Authorization - Visit Number 12   Authorization - Number of Visits 18   PT Start Time [id number]   PT Stop Time 1058   PT Time Calculation (min) 40 min   Activity Tolerance Patient tolerated treatment well   Behavior During Therapy [facility] for tasks assessed/performed      Past Medical History  Diagnosis Date  . Hypertension   . Thyroid disease     hypothyroidism  . Renal disorder     renal insufficiency  . Hypoaldosteronism   . AAA (abdominal aortic aneurysm)   . Arthritis     osteoarthritis  . Sarcoidosis   . Hyperlipidemia     Past Surgical History  Procedure Laterality Date  . Abdominal aortic aneurysm repair    . Breast biopsy    . Renal vein sampling    . Renal biopsy      There were no vitals taken for this visit.  Visit Diagnosis:  Midline low back pain without sciatica  Hip pain, bilateral  Weakness of both hips  Weakness of both legs  Weakness of trunk musculature      Subjective Assessment - 03/06/14 1020    Symptoms Only mild discomfort today, "not much" pain.    Currently in Pain? Yes   Pain Score 1                     [facility] Adult PT Treatment/Exercise - 03/06/14 0001    Exercises   Exercises Lumbar   Lumbar Exercises: Stretches   Active Hamstring Stretch 1 rep;30 seconds   Active Hamstring Stretch Limitations 3 way on 14 in step   Passive Hamstring Stretch 1 rep;30 seconds   Passive Hamstring Stretch  Limitations Gastroc Stretch, Slantboard   Quad Stretch 1 rep;30 seconds   Quad Stretch Limitations Prone with rope   ITB Stretch 1 rep;30 seconds   ITB Stretch Limitations 1st Step   Piriformis Stretch 1 rep;30 seconds   Piriformis Stretch Limitations seated    Lumbar Exercises: Standing   Functional Squats 5 reps;Limitations   Functional Squats Limitations squat reach matrix wtih 3# 5 sets   Forward Lunge 15 reps   Forward Lunge Limitations 6"   Side Lunge 15 reps   Side Lunge Limitations 6" box   Other Standing Lumbar Exercises 3D Hip Excursion x10   Lumbar Exercises: Seated   Other Seated Lumbar Exercises Core Strengthening: yellow ball chest level forward/back and rotation x10, green ball OH forward/back and sidebend x10 FEET UNSUPPORTED   Lumbar Exercises: Quadruped   Straight Leg Raise 10 reps   Opposite Arm/Leg Raise 5 reps;Left arm/Right leg;Right arm/Left leg                  PT Short Term Goals - 03/01/14 1446    PT SHORT TERM GOAL #1   Title Patient will be independent with low level HEP for trunk and LE strengthening  Status Achieved   PT SHORT TERM GOAL #2   Title patient will demonstrate increased abdomenal muscle strength of 4/5 MMT to improve trunk stability   Status Achieved   PT SHORT TERM GOAL #3   Title Patient will demonstrate 3/5MMt hip abduction bilaterally indicating improved hip stability   Status Achieved   PT SHORT TERM GOAL #4   Title Patient will demonstrate increased knee flexion strength of 4/5 MMT indicating improved knee stability and decreased buckling during gait.    Status Achieved   PT SHORT TERM GOAL #5   Title Patient will be able to perform sit to stand 5x in <15 seconds to indicate patient not at high risk of falls.    Status On-going           PT Long Term Goals - 03/01/14 1449    PT LONG TERM GOAL #1   Title Patient will be independent with advanced HEP for trunk and LE strengthening   Status On-going   PT LONG  TERM GOAL #2   Title patient will demonstrate increased abdomenal muscle strength of 5/5 MMT to improve trunk stability   Status On-going   PT LONG TERM GOAL #3   Title Patient will demonstrate 4/5MMt hip extension bilaterally indicating improved hip stability   Status On-going   PT LONG TERM GOAL #4   Title Patient will demonstrate increased knee flexion strength of 4+/5 MMT indicating improved knee stability and decreased buckling durign gait.    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    Status On-going               Plan - 03/06/14 1058    Clinical Impression Statement Continued focus onstrengthening program, with limited stretches between exercises for active rest break.   Added trunk strengthening program sitting at EOB with weighted ball for increased challenge.  Noted some compensations with use of hip flexor as ab strength is limited.  Also, progressed to bird-dog exercise with close SBA, though after VC for core stabilization pt was able to complete exercise without physical assist from PT to maintain balance.     Pt will benefit from skilled therapeutic intervention in order to improve on the following deficits Abnormal gait;Pain;Decreased strength;Difficulty walking;Decreased balance;Improper body mechanics;Postural dysfunction;Decreased endurance   Rehab Potential Good   PT Frequency 2x / week   PT Duration 4 weeks   PT Next Visit Plan check SIJ alignement.  Decrease reps of stretching, and focus on strengthening program.  Add hold or decrease height of step for lunges for increased strengthening.          Problem List There are no active problems to display for this patient.  Lonna Cobb, DPT [phone]  [place] [address] [place], [place], [address] Phone: [phone]   Fax:  [phone]

## 2014-03-07 DIAGNOSIS — M5489 Other dorsalgia: Secondary | ICD-10-CM | POA: Diagnosis not present

## 2014-03-07 NOTE — Addendum Note (Signed)
Encounter addended by: Leia Alf, PT on: 03/07/2014  8:22 AM<BR>     Documentation filed: Clinical Notes, Flowsheet VN

## 2014-03-07 NOTE — Addendum Note (Signed)
Encounter addended by: Leia Alf, PT on: 03/07/2014  8:26 AM<BR>     Documentation filed: Clinical Notes

## 2014-03-08 ENCOUNTER — Ambulatory Visit (HOSPITAL_COMMUNITY)
Admission: RE | Admit: 2014-03-08 | Discharge: 2014-03-08 | Disposition: A | Discharge status: Home / Self Care | Payer: Medicare Other | Source: Ambulatory Visit | Attending: Endocrinology | Admitting: Endocrinology

## 2014-03-08 DIAGNOSIS — M25551 Pain in right hip: Secondary | ICD-10-CM

## 2014-03-08 DIAGNOSIS — M25552 Pain in left hip: Secondary | ICD-10-CM

## 2014-03-08 DIAGNOSIS — M545 Low back pain, unspecified: Secondary | ICD-10-CM

## 2014-03-08 DIAGNOSIS — M6281 Muscle weakness (generalized): Secondary | ICD-10-CM

## 2014-03-08 DIAGNOSIS — R29898 Other symptoms and signs involving the musculoskeletal system: Secondary | ICD-10-CM

## 2014-03-08 DIAGNOSIS — M5489 Other dorsalgia: Secondary | ICD-10-CM | POA: Diagnosis not present

## 2014-03-08 NOTE — Therapy (Signed)
[facility] [address] [place], [place], [address] Phone: [phone]   Fax:  [phone]  Physical Therapy Treatment  Patient Details  Name: Cheryl [name] MRN: [id number] Date of Birth: 06/20/1950 Referring Provider:  Altheimer, Michael D, MD  Encounter Date: 03/08/2014      PT End of Session - 03/08/14 1101    Visit Number 13   Number of Visits 18   Date for PT Re-Evaluation 03/29/14   Authorization Type [employer] medicare   Authorization Time Period G-code updated visit #10   Authorization - Visit Number 13   Authorization - Number of Visits 18   PT Start Time 1025   PT Stop Time 1100   PT Time Calculation (min) 35 min   Activity Tolerance Patient tolerated treatment well   Behavior During Therapy [facility] for tasks assessed/performed      Past Medical History  Diagnosis Date  . Hypertension   . Thyroid disease     hypothyroidism  . Renal disorder     renal insufficiency  . Hypoaldosteronism   . AAA (abdominal aortic aneurysm)   . Arthritis     osteoarthritis  . Sarcoidosis   . Hyperlipidemia     Past Surgical History  Procedure Laterality Date  . Abdominal aortic aneurysm repair    . Breast biopsy    . Renal vein sampling    . Renal biopsy      There were no vitals taken for this visit.  Visit Diagnosis:  Midline low back pain without sciatica  Hip pain, bilateral  Weakness of both hips  Weakness of both legs  Weakness of trunk musculature      Subjective Assessment - 03/08/14 1026    Symptoms Note minimal discomfort in back, no pain noted. states she feels like shes's doing better.    Currently in Pain? No/denies                    [facility] Adult PT Treatment/Exercise - 03/08/14 0001    Lumbar Exercises: Stretches   Active Hamstring Stretch 1 rep;20 seconds   Passive Hamstring Stretch 1 rep;30 seconds   Passive Hamstring Stretch Limitations Gastroc Stretch, Slantboard   Quad Stretch 1 rep;30  seconds   Quad Stretch Limitations Prone with rope   ITB Stretch 1 rep;30 seconds   ITB Stretch Limitations 1st Step   Piriformis Stretch 1 rep;30 seconds   Piriformis Stretch Limitations seated    Lumbar Exercises: Standing   Functional Squats --   Functional Squats Limitations --   Forward Lunge --   Forward Lunge Limitations --   Side Lunge --   Side Lunge Limitations --   Row 10 reps   Theraband Level (Row) Level 4 (Blue)   Row Limitations 2 sets, unilateral   Other Standing Lumbar Exercises 3D Hip Excursion x10   Other Standing Lumbar Exercises overhead dumbbell matrix with 3lb dumbbells 5x all common low back directions.    Lumbar Exercises: Quadruped   Plank to raised table with 3D hip drive   Manual Therapy   Manual Therapy Joint mobilization   Joint Mobilization SI within allignemnt, no MET required today   Other Manual Therapy Soft tissue mobilization of sartorius.                   PT Short Term Goals - 03/01/14 1446    PT SHORT TERM GOAL #1   Title Patient will be independent with low level HEP   for trunk and LE strengthening   Status Achieved   PT SHORT TERM GOAL #2   Title patient will demonstrate increased abdomenal muscle strength of 4/5 MMT to improve trunk stability   Status Achieved   PT SHORT TERM GOAL #3   Title Patient will demonstrate 3/5MMt hip abduction bilaterally indicating improved hip stability   Status Achieved   PT SHORT TERM GOAL #4   Title Patient will demonstrate increased knee flexion strength of 4/5 MMT indicating improved knee stability and decreased buckling during gait.    Status Achieved   PT SHORT TERM GOAL #5   Title Patient will be able to perform sit to stand 5x in <15 seconds to indicate patient not at high risk of falls.    Status On-going           PT Long Term Goals - 03/01/14 1449    PT LONG TERM GOAL #1   Title Patient will be independent with advanced HEP for trunk and LE strengthening   Status On-going    PT LONG TERM GOAL #2   Title patient will demonstrate increased abdomenal muscle strength of 5/5 MMT to improve trunk stability   Status On-going   PT LONG TERM GOAL #3   Title Patient will demonstrate 4/5MMt hip extension bilaterally indicating improved hip stability   Status On-going   PT LONG TERM GOAL #4   Title Patient will demonstrate increased knee flexion strength of 4+/5 MMT indicating improved knee stability and decreased buckling durign gait.    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    Status On-going               Plan - 03/08/14 1101    Clinical Impression Statement patient arrived 67mnutes late. Patient displays Rt thigh pain secondary to sortorius pain. Pain appears to have insidius onset and may be attributed to continueing to require adaptation to improved gait mechanics. TYhis session patient dmeosntrated improved gait and steady stride with patient stating increased since of balance and steadiness. Session focused increased trunk and low back strength with standing exercises.    PT Next Visit Plan check SIJ alignement.  Decrease reps of stretching, and focus on strengthening program. Reintroduce lunges and squats per pain.  decrease height of step for lunges for increased strengthening.     Consulted and Agree with Plan of Care Patient          G-Codes - 001/30/20160819    Functional Assessment Tool Used foto 50% limited   Functional Limitation Other PT primary   Other PT Primary Current Status ((Z0258 At least 40 percent but less than 60 percent impaired, limited or restricted   Other PT Primary Goal Status (([id number] At least 20 percent but less than 40 percent impaired, limited or restricted      Problem List There are no active problems to display for this patient.   DLeia Alf1/13/2016, 11:07 AM  [place] [address]Phone: [phone]  Fax:  [phone]

## 2014-03-13 ENCOUNTER — Ambulatory Visit (HOSPITAL_COMMUNITY)
Admission: RE | Admit: 2014-03-13 | Discharge: 2014-03-13 | Disposition: A | Discharge status: Home / Self Care | Payer: Medicare Other | Source: Ambulatory Visit | Attending: Rheumatology | Admitting: Rheumatology

## 2014-03-13 ENCOUNTER — Encounter (HOSPITAL_COMMUNITY): Payer: Self-pay | Admitting: Physical Therapy

## 2014-03-13 DIAGNOSIS — M6281 Muscle weakness (generalized): Secondary | ICD-10-CM

## 2014-03-13 DIAGNOSIS — M545 Low back pain, unspecified: Secondary | ICD-10-CM

## 2014-03-13 DIAGNOSIS — M25552 Pain in left hip: Principal | ICD-10-CM

## 2014-03-13 DIAGNOSIS — M5489 Other dorsalgia: Secondary | ICD-10-CM | POA: Diagnosis not present

## 2014-03-13 DIAGNOSIS — M25551 Pain in right hip: Secondary | ICD-10-CM

## 2014-03-13 DIAGNOSIS — Z9181 History of falling: Secondary | ICD-10-CM

## 2014-03-13 DIAGNOSIS — R29898 Other symptoms and signs involving the musculoskeletal system: Secondary | ICD-10-CM

## 2014-03-13 NOTE — Therapy (Signed)
[place], [place], [address] Phone: [phone]   Fax:  [phone]  Physical Therapy Treatment  Patient Details  Name: Cheryl [name] MRN: [id number] Date of Birth: December 09, 1950 Referring Provider:  Campbell Lerner, MD  Encounter Date: 03/13/2014      PT End of Session - 03/13/14 1055    Visit Number 14   Number of Visits 18   Date for PT Re-Evaluation 03/29/14   Authorization Type [employer] medicare   Authorization Time Period G-code updated visit #10   Authorization - Visit Number 14   Authorization - Number of Visits 18   PT Start Time 1020   PT Stop Time 1100   PT Time Calculation (min) 40 min   Activity Tolerance Patient tolerated treatment well   Behavior During Therapy [facility] for tasks assessed/performed      Past Medical History  Diagnosis Date  . Hypertension   . Thyroid disease     hypothyroidism  . Renal disorder     renal insufficiency  . Hypoaldosteronism   . AAA (abdominal aortic aneurysm)   . Arthritis     osteoarthritis  . Sarcoidosis   . Hyperlipidemia     Past Surgical History  Procedure Laterality Date  . Abdominal aortic aneurysm repair    . Breast biopsy    . Renal vein sampling    . Renal biopsy      There were no vitals taken for this visit.  Visit Diagnosis:  Hip pain, bilateral  Midline low back pain without sciatica  Weakness of both hips  Weakness of both legs  Weakness of trunk musculature  Risk for falls      Subjective Assessment - 03/13/14 1056    Symptoms No complaints of pain in low back or sartorius in Rt leg today.    Currently in Pain? No/denies          [facility] PT Assessment - 03/13/14 1025    Assessment   Medical Diagnosis hip and knee  weakness as well as decreased balance. lumbago   Onset Date 06/24/13   Next MD Visit Dr. Lenn Sink  March                  [facility] Adult PT Treatment/Exercise - 03/13/14 0001    Exercises   Exercises Lumbar   Lumbar Exercises: Stretches   Active Hamstring Stretch 1 rep;20 seconds   Active Hamstring Stretch Limitations 3 way on 17 in step   Passive Hamstring Stretch 1 rep;20 seconds   Passive Hamstring Stretch Limitations 3 Way, Firefighter 1 rep;30 seconds   Quad Stretch Limitations Prone with rope   ITB Stretch 1 rep;30 seconds   ITB Stretch Limitations 1st Step   Piriformis Stretch 1 rep;30 seconds   Piriformis Stretch Limitations Figure 4    Lumbar Exercises: Standing   Functional Squats 5 reps;Limitations   Functional Squats Limitations squat reach matrix wtih 3# 5 sets   Forward Lunge 15 reps   Forward Lunge Limitations 4"    Side Lunge 15 reps   Side Lunge Limitations 4"   Push / Pull Sled Core Strengthening : Lateral Sidebend arms OH, Rotation arms at shoulder height with red ball x10 each   Row 10 reps   Theraband Level (Row) Level 4 (Blue)   Row Limitations 2 sets, unilateral with both BTB in one hand   Lumbar Exercises: Quadruped   Straight Leg Raise 20 reps  Straight Leg Raises Limitations 2x10   Manual Therapy   Manual Therapy Joint mobilization   Joint Mobilization SIJ aligned, no MET needed                  PT Short Term Goals - 03/01/14 1446    PT SHORT TERM GOAL #1   Title Patient will be independent with low level HEP for trunk and LE strengthening   Status Achieved   PT SHORT TERM GOAL #2   Title patient will demonstrate increased abdomenal muscle strength of 4/5 MMT to improve trunk stability   Status Achieved   PT SHORT TERM GOAL #3   Title Patient will demonstrate 3/5MMt hip abduction bilaterally indicating improved hip stability   Status Achieved   PT SHORT TERM GOAL #4   Title Patient will demonstrate increased knee flexion strength of 4/5 MMT indicating improved knee stability and decreased buckling during gait.    Status Achieved   PT SHORT TERM GOAL #5   Title Patient will be able to perform sit to stand 5x  in <15 seconds to indicate patient not at high risk of falls.    Status On-going           PT Long Term Goals - 03/01/14 1449    PT LONG TERM GOAL #1   Title Patient will be independent with advanced HEP for trunk and LE strengthening   Status On-going   PT LONG TERM GOAL #2   Title patient will demonstrate increased abdomenal muscle strength of 5/5 MMT to improve trunk stability   Status On-going   PT LONG TERM GOAL #3   Title Patient will demonstrate 4/5MMt hip extension bilaterally indicating improved hip stability   Status On-going   PT LONG TERM GOAL #4   Title Patient will demonstrate increased knee flexion strength of 4+/5 MMT indicating improved knee stability and decreased buckling durign gait.    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    Status On-going               Plan - 03/13/14 1057    Clinical Impression Statement Pt reprots no complaints of pain in Rt thigh today and was able to re-start lunging and squatting exercises today.  Progressed core strengthening in standing with weighted ball, though after this exercise pt unable to complete plank at EOB secondary to fatigue.   Pt will benefit from skilled therapeutic intervention in order to improve on the following deficits Abnormal gait;Pain;Decreased strength;Difficulty walking;Decreased balance;Improper body mechanics;Postural dysfunction;Decreased endurance   Rehab Potential Good   PT Frequency 2x / week   PT Duration 4 weeks   PT Next Visit Plan check SIJ alignement.  Decrease reps of stretching, and focus on strengthening program. Reintroduce lunges and squats per pain.          Problem List There are no active problems to display for this patient.   Lonna Cobb, DPT [phone]   03/13/2014, 11:01 AM  [facility] [address] [place], [place],  [address] Phone: [phone]   Fax:  [phone]

## 2014-03-15 ENCOUNTER — Ambulatory Visit (HOSPITAL_COMMUNITY)
Admission: RE | Admit: 2014-03-15 | Discharge: 2014-03-15 | Disposition: A | Discharge status: Home / Self Care | Payer: Medicare Other | Source: Ambulatory Visit | Attending: Rheumatology | Admitting: Rheumatology

## 2014-03-15 DIAGNOSIS — M6281 Muscle weakness (generalized): Secondary | ICD-10-CM

## 2014-03-15 DIAGNOSIS — M545 Low back pain, unspecified: Secondary | ICD-10-CM

## 2014-03-15 DIAGNOSIS — R29898 Other symptoms and signs involving the musculoskeletal system: Secondary | ICD-10-CM

## 2014-03-15 DIAGNOSIS — M25552 Pain in left hip: Principal | ICD-10-CM

## 2014-03-15 DIAGNOSIS — Z9181 History of falling: Secondary | ICD-10-CM

## 2014-03-15 DIAGNOSIS — M25551 Pain in right hip: Secondary | ICD-10-CM

## 2014-03-15 DIAGNOSIS — M5489 Other dorsalgia: Secondary | ICD-10-CM | POA: Diagnosis not present

## 2014-03-15 NOTE — Therapy (Signed)
[place], [place], [address] Phone: [phone]   Fax:  [phone]  Physical Therapy Treatment  Patient Details  Name: Cheryl [name] MRN: [id number] Date of Birth: August 09, 1950 Referring Provider:  Campbell Lerner, MD  Encounter Date: 03/15/2014      PT End of Session - 03/15/14 1123    Visit Number 15   Number of Visits 17   Date for PT Re-Evaluation 03/29/14   Authorization Type [employer] medicare   Authorization Time Period G-code updated visit #10   Authorization - Visit Number 15   Authorization - Number of Visits 20   PT Start Time [id number]   PT Stop Time 1055   PT Time Calculation (min) 40 min   Activity Tolerance Patient tolerated treatment well   Behavior During Therapy [facility] for tasks assessed/performed      Past Medical History  Diagnosis Date  . Hypertension   . Thyroid disease     hypothyroidism  . Renal disorder     renal insufficiency  . Hypoaldosteronism   . AAA (abdominal aortic aneurysm)   . Arthritis     osteoarthritis  . Sarcoidosis   . Hyperlipidemia     Past Surgical History  Procedure Laterality Date  . Abdominal aortic aneurysm repair    . Breast biopsy    . Renal vein sampling    . Renal biopsy      There were no vitals taken for this visit.  Visit Diagnosis:  Hip pain, bilateral  Midline low back pain without sciatica  Weakness of both hips  Weakness of both legs  Weakness of trunk musculature  Risk for falls      Subjective Assessment - 03/15/14 1055    Symptoms No pain. notes she only has 2 appointments left and is then ready for discharge.    Currently in Pain? No/denies          [facility] Adult PT Treatment/Exercise - 03/15/14 0001    Lumbar Exercises: Stretches   Passive Hamstring Stretch 1 rep;30 seconds   Passive Hamstring Stretch Limitations Gastroc Stretch, Administrator, [employer] Limitations Prone with rope   Piriformis Stretch Limitations seated 10x 3 seconds   Lumbar Exercises: Standing   Functional Squats 5 reps;Limitations   Functional Squats Limitations squat reach matrix wtih 3# 5 sets   Forward Lunge Limitations Lunge matrix common to floor 5x each   Row 10 reps   Theraband Level (Row) Level 4 (Blue)   Other Standing Lumbar Exercises 4 way pick up, 5lb dumbbell 10x each   Lumbar Exercises: Quadruped   Straight Leg Raise 20 reps   Straight Leg Raises Limitations 2x10   Manual Therapy   Joint Mobilization SIJ alignemnt assessed within normal limits.            PT Short Term Goals - 03/01/14 1446    PT SHORT TERM GOAL #1   Title Patient will be independent with low level HEP for trunk and LE strengthening   Status Achieved   PT SHORT TERM GOAL #2   Title patient will demonstrate increased abdomenal muscle strength of 4/5 MMT to improve trunk stability   Status Achieved   PT SHORT TERM GOAL #3   Title Patient will demonstrate 3/5MMt hip abduction bilaterally indicating improved hip stability   Status Achieved   PT SHORT TERM GOAL #4   Title Patient will demonstrate increased knee flexion strength  of 4/5 MMT indicating improved knee stability and decreased buckling during gait.    Status Achieved   PT SHORT TERM GOAL #5   Title Patient will be able to perform sit to stand 5x in <15 seconds to indicate patient not at high risk of falls.    Status On-going           PT Long Term Goals - 03/01/14 1449    PT LONG TERM GOAL #1   Title Patient will be independent with advanced HEP for trunk and LE strengthening   Status On-going   PT LONG TERM GOAL #2   Title patient will demonstrate increased abdomenal muscle strength of 5/5 MMT to improve trunk stability   Status On-going   PT LONG TERM GOAL #3   Title Patient will demonstrate 4/5MMt hip extension bilaterally indicating improved hip stability   Status On-going   PT LONG TERM GOAL #4   Title Patient will demonstrate  increased knee flexion strength of 4+/5 MMT indicating improved knee stability and decreased buckling durign gait.    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    Status On-going               Plan - 03/15/14 1124    Clinical Impression Statement Patient reports no pain. Dem,osntrated good performance of excercises though she required cuing for depth of lunges and squats due to glute weakness. Patient dmeosntrated good perofmrnace wth 4 way pick up though patient fatigues during fourth set with increased reliance on back for lifting. Therapy to continue focus on functional activities to increase lumbar spine stability for lifitng.  cues required for quadriped exercises to maintain hip and trunk alignment   PT Next Visit Plan PRogress 4 way pick up to 10lb wieght. Progress lunges to knee high reach with overhead press upon return.         Problem List There are no active problems to display for this patient.  Devona Konig PT DPT [place] [address] [place], [place], [address] Phone: [phone]   Fax:  [phone]

## 2014-03-20 ENCOUNTER — Ambulatory Visit (HOSPITAL_COMMUNITY): Payer: Medicare Other | Admitting: Physical Therapy

## 2014-03-20 ENCOUNTER — Ambulatory Visit (HOSPITAL_COMMUNITY)
Admission: RE | Admit: 2014-03-20 | Discharge: 2014-03-20 | Disposition: A | Discharge status: Home / Self Care | Payer: Medicare Other | Source: Ambulatory Visit | Attending: Rheumatology | Admitting: Rheumatology

## 2014-03-20 DIAGNOSIS — R29898 Other symptoms and signs involving the musculoskeletal system: Secondary | ICD-10-CM

## 2014-03-20 DIAGNOSIS — M545 Low back pain, unspecified: Secondary | ICD-10-CM

## 2014-03-20 DIAGNOSIS — M25552 Pain in left hip: Principal | ICD-10-CM

## 2014-03-20 DIAGNOSIS — Z9181 History of falling: Secondary | ICD-10-CM

## 2014-03-20 DIAGNOSIS — M25551 Pain in right hip: Secondary | ICD-10-CM

## 2014-03-20 DIAGNOSIS — M5489 Other dorsalgia: Secondary | ICD-10-CM | POA: Diagnosis not present

## 2014-03-20 DIAGNOSIS — M6281 Muscle weakness (generalized): Secondary | ICD-10-CM

## 2014-03-20 NOTE — Therapy (Signed)
[place], [place], [address] Phone: [phone]   Fax:  [phone]  Physical Therapy Treatment  Patient Details  Name: Cheryl [name] MRN: [id number] Date of Birth: 01/21/1951 Referring Provider:  Campbell Lerner, MD  Encounter Date: 03/20/2014      PT End of Session - 03/20/14 1556    Visit Number 16   Number of Visits 17   Date for PT Re-Evaluation 03/29/14   Authorization Type [employer] medicare   Authorization Time Period G-code updated visit #10   Authorization - Visit Number 16   Authorization - Number of Visits 20   PT Start Time [id number]   PT Stop Time 1558   PT Time Calculation (min) 41 min   Activity Tolerance Patient tolerated treatment well   Behavior During Therapy [facility] for tasks assessed/performed      Past Medical History  Diagnosis Date  . Hypertension   . Thyroid disease     hypothyroidism  . Renal disorder     renal insufficiency  . Hypoaldosteronism   . AAA (abdominal aortic aneurysm)   . Arthritis     osteoarthritis  . Sarcoidosis   . Hyperlipidemia     Past Surgical History  Procedure Laterality Date  . Abdominal aortic aneurysm repair    . Breast biopsy    . Renal vein sampling    . Renal biopsy      There were no vitals taken for this visit.  Visit Diagnosis:  Hip pain, bilateral  Midline low back pain without sciatica  Weakness of both hips  Weakness of trunk musculature  Risk for falls  Weakness of both legs      Subjective Assessment - 03/20/14 1522    Symptoms No complaints of pain.  Pt reprots she feels good with her HEP, and is ready for discharge next visit.     Currently in Pain? No/denies          [facility] PT Assessment - 03/20/14 0001    Assessment   Medical Diagnosis hip and knee  weakness as well as decreased balance. lumbago   Onset Date 06/24/13   Next MD Visit Dr. Lenn Sink  March                  [facility] Adult PT  Treatment/Exercise - 03/20/14 0001    Lumbar Exercises: Stretches   Active Hamstring Stretch 1 rep;20 seconds   Active Hamstring Stretch Limitations 3 way on 17 in step   Passive Hamstring Stretch 1 rep;30 seconds   Passive Hamstring Stretch Limitations Gastroc Stretch, Administrator, [employer] Limitations Prone with rope   Piriformis Stretch 1 rep;30 seconds   Piriformis Stretch Limitations Seated   Lumbar Exercises: Standing   Heel Raises 10 reps   Heel Raises Limitations with squat to 14" box, then lateral leans with red ball [place]   Forward Lunge Limitations Lunge matrix common reach to knee height 5x each   Row 10 reps   Theraband Level (Row) Level 4 (Blue)   Shoulder Extension 10 reps   Theraband Level (Shoulder Extension) Level 4 (Blue)   Other Standing Lumbar Exercises [place], GTB 30' RT   Lumbar Exercises: Prone   Other Prone Lumbar Exercises Donkey Kick, 2x10 2# (B) LE   Lumbar Exercises: Quadruped   Straight Leg Raise 10 reps   Straight Leg Raises Limitations 1#   Opposite Arm/Leg Raise 10  reps;Left arm/Right leg;Right arm/Left leg   Opposite Arm/Leg Raise Limitations 1# LE                  PT Short Term Goals - 03/01/14 1446    PT SHORT TERM GOAL #1   Title Patient will be independent with low level HEP for trunk and LE strengthening   Status Achieved   PT SHORT TERM GOAL #2   Title patient will demonstrate increased abdomenal muscle strength of 4/5 MMT to improve trunk stability   Status Achieved   PT SHORT TERM GOAL #3   Title Patient will demonstrate 3/5MMt hip abduction bilaterally indicating improved hip stability   Status Achieved   PT SHORT TERM GOAL #4   Title Patient will demonstrate increased knee flexion strength of 4/5 MMT indicating improved knee stability and decreased buckling during gait.    Status Achieved   PT SHORT TERM GOAL #5   Title Patient will be able to perform sit to stand 5x in <15  seconds to indicate patient not at high risk of falls.    Status On-going           PT Long Term Goals - 03/01/14 1449    PT LONG TERM GOAL #1   Title Patient will be independent with advanced HEP for trunk and LE strengthening   Status On-going   PT LONG TERM GOAL #2   Title patient will demonstrate increased abdomenal muscle strength of 5/5 MMT to improve trunk stability   Status On-going   PT LONG TERM GOAL #3   Title Patient will demonstrate 4/5MMt hip extension bilaterally indicating improved hip stability   Status On-going   PT LONG TERM GOAL #4   Title Patient will demonstrate increased knee flexion strength of 4+/5 MMT indicating improved knee stability and decreased buckling durign gait.    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    Status On-going               Plan - 03/20/14 1556    Clinical Impression Statement Continued exercise program, focusing on technique with exercises to ensure compliance and understanding of HEP after discharge.  Pt has demonstrated great improvements in activity tolerance with exercises, though on occassion does need cueing for breathing techniques, particuarly with activities that require motor coordination (bird dog).  No complaints of pain by end of treatment session.     Pt will benefit from skilled therapeutic intervention in order to improve on the following deficits Abnormal gait;Pain;Decreased strength;Difficulty walking;Decreased balance;Improper body mechanics;Postural dysfunction;Decreased endurance   Rehab Potential Good   PT Frequency 2x / week   PT Treatment/Interventions Therapeutic exercise;Balance training;Patient/family education;Manual techniques;Functional mobility training;Therapeutic activities;Gait training   PT Next Visit Plan D/C next visit.         Problem List There are no active problems to display for this  patient.   Lonna Cobb, DPT [phone]  03/20/2014, 3:59 PM  [place] [address] [place], [place], [address] Phone: [phone]   Fax:  [phone]

## 2014-03-22 ENCOUNTER — Ambulatory Visit (HOSPITAL_COMMUNITY)
Admission: RE | Admit: 2014-03-22 | Discharge: 2014-03-22 | Disposition: A | Discharge status: Home / Self Care | Payer: Medicare Other | Source: Ambulatory Visit | Attending: Rheumatology | Admitting: Rheumatology

## 2014-03-22 DIAGNOSIS — R29898 Other symptoms and signs involving the musculoskeletal system: Secondary | ICD-10-CM

## 2014-03-22 DIAGNOSIS — M545 Low back pain, unspecified: Secondary | ICD-10-CM

## 2014-03-22 DIAGNOSIS — M6281 Muscle weakness (generalized): Secondary | ICD-10-CM

## 2014-03-22 DIAGNOSIS — M25552 Pain in left hip: Principal | ICD-10-CM

## 2014-03-22 DIAGNOSIS — Z9181 History of falling: Secondary | ICD-10-CM

## 2014-03-22 DIAGNOSIS — M5489 Other dorsalgia: Secondary | ICD-10-CM | POA: Diagnosis not present

## 2014-03-22 DIAGNOSIS — M25551 Pain in right hip: Secondary | ICD-10-CM

## 2014-03-22 NOTE — Therapy (Signed)
[place], [place], [address] Phone: [phone]   Fax:  [phone]  Physical Therapy Treatment  Patient Details  Name: Cheryl [name] MRN: [id number] Date of Birth: 1950-06-20 Referring Provider:  Campbell Lerner, MD  Encounter Date: 03/22/2014      PT End of Session - 03/22/14 1100    Visit Number 17   Number of Visits 17   Date for PT Re-Evaluation 03/29/14   Authorization Type [employer] medicare   Authorization Time Period G-code updated visit #10   Authorization - Visit Number 17   Authorization - Number of Visits 20   PT Start Time 1022   PT Stop Time 1059   PT Time Calculation (min) 37 min   Activity Tolerance Patient tolerated treatment well   Behavior During Therapy [facility] for tasks assessed/performed      Past Medical History  Diagnosis Date  . Hypertension   . Thyroid disease     hypothyroidism  . Renal disorder     renal insufficiency  . Hypoaldosteronism   . AAA (abdominal aortic aneurysm)   . Arthritis     osteoarthritis  . Sarcoidosis   . Hyperlipidemia     Past Surgical History  Procedure Laterality Date  . Abdominal aortic aneurysm repair    . Breast biopsy    . Renal vein sampling    . Renal biopsy      There were no vitals taken for this visit.  Visit Diagnosis:  Hip pain, bilateral  Midline low back pain without sciatica  Weakness of both hips  Weakness of trunk musculature  Risk for falls  Weakness of both legs      Subjective Assessment - 03/22/14 1026    Symptoms No pain just soreness along groin.           [facility] PT Assessment - 03/22/14 0001    Assessment   Medical Diagnosis hip and knee  weakness as well as decreased balance. lumbago   Onset Date 06/24/13   Next MD Visit Dr. Lenn Sink  March   Observation/Other Assessments   Focus on Therapeutic Outcomes (FOTO)  12% limited   Strength   Right Hip Flexion 5/5   Right Hip Extension 4/5   Right  Hip ABduction 4/5   Right Hip ADduction 4-/5   Left Hip Flexion 5/5   Left Hip Extension 4/5   Left Hip ABduction 4-/5   Left Hip ADduction 4-/5   Right Knee Flexion 5/5   Right Knee Extension 5/5   Left Knee Flexion 5/5   Left Knee Extension 5/5   Right Ankle Dorsiflexion 5/5   Left Ankle Dorsiflexion 5/5   Lumbar Flexion 5/5   Lumbar Extension 5/5         [facility] Adult PT Treatment/Exercise - 03/22/14 0001    Lumbar Exercises: Stretches   Active Hamstring Stretch 1 rep;20 seconds   Active Hamstring Stretch Limitations 3 way on 17 in step   Passive Hamstring Stretch 1 rep;30 seconds   Passive Hamstring Stretch Limitations Gastroc Stretch, Administrator, [employer] Limitations Prone with rope   Piriformis Stretch 1 rep;30 seconds   Piriformis Stretch Limitations Seated   Lumbar Exercises: Standing   Functional Squats Limitations Squat reach matrix with 3lb 45   Forward Lunge Limitations Lunge matrix common reach to knee height 5x each with 3 lb   Other Standing Lumbar Exercises Overhead dumbbell matrix  for low back 3lb 5x each              PT Short Term Goals - 28-Mar-2014 1053    PT SHORT TERM GOAL #1   Title Patient will be independent with low level HEP for trunk and LE strengthening   Status Achieved   PT SHORT TERM GOAL #2   Title patient will demonstrate increased abdomenal muscle strength of 4/5 MMT to improve trunk stability   Status Achieved   PT SHORT TERM GOAL #3   Title Patient will demonstrate 3/5MMt hip abduction bilaterally indicating improved hip stability   Status Achieved   PT SHORT TERM GOAL #4   Title Patient will demonstrate increased knee flexion strength of 4/5 MMT indicating improved knee stability and decreased buckling during gait.    Status Achieved   PT SHORT TERM GOAL #5   Title Patient will be able to perform sit to stand 5x in <15 seconds to indicate patient not at high risk of falls.    Status Achieved            PT Long Term Goals - 03-28-2014 1054    PT LONG TERM GOAL #1   Title Patient will be independent with advanced HEP for trunk and LE strengthening   Status Achieved   PT LONG TERM GOAL #2   Title patient will demonstrate increased abdomenal muscle strength of 5/5 MMT to improve trunk stability   Status Achieved   PT LONG TERM GOAL #3   Title Patient will demonstrate 4/5MMt hip extension bilaterally indicating improved hip stability   Status Achieved   PT LONG TERM GOAL #4   Title Patient will demonstrate increased knee flexion strength of 4+/5 MMT indicating improved knee stability and decreased buckling durign gait.    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    Status Achieved               Plan - 03/28/2014 1058    Clinical Impression Statement Patient has met all goals and is ready for discharge. Evercises for HEP reviewed for continued progression in fucntional threshold. Patient states readiness for discharge with noting no pain or difficulty with all home and out of home activities.    PT Home Exercise Plan given          G-Codes - 28-Mar-2014 1100    Functional Assessment Tool Used FOTO 11%   Functional Limitation Other PT primary   Other PT Primary Goal Status (V5643) At least 20 percent but less than 40 percent impaired, limited or restricted   Other PT Primary Discharge Status (P2951) At least 1 percent but less than 20 percent impaired, limited or restricted      Problem List There are no active problems to display for this patient.  Devona Konig PT DPT [place] [address] [place], [place], [address] Phone: [phone]   Fax:  [phone]   PHYSICAL THERAPY DISCHARGE SUMMARY  Visits from Start of Care: 17  Current functional level related to goals / functional outcomes:  All goals met   Plan: Patient agrees to discharge.  Patient goals were met. Patient is being discharged due to meeting the stated rehab goals.  ?????        Devona Konig PT DPT [phone]

## 2015-04-23 DIAGNOSIS — N183 Chronic kidney disease, stage 3 (moderate): Secondary | ICD-10-CM | POA: Diagnosis not present

## 2015-04-23 DIAGNOSIS — E2609 Other primary hyperaldosteronism: Secondary | ICD-10-CM | POA: Diagnosis not present

## 2015-04-23 DIAGNOSIS — E038 Other specified hypothyroidism: Secondary | ICD-10-CM | POA: Diagnosis not present

## 2015-04-23 DIAGNOSIS — E782 Mixed hyperlipidemia: Secondary | ICD-10-CM | POA: Diagnosis not present

## 2015-04-23 DIAGNOSIS — E559 Vitamin D deficiency, unspecified: Secondary | ICD-10-CM | POA: Diagnosis not present

## 2015-04-27 DIAGNOSIS — E038 Other specified hypothyroidism: Secondary | ICD-10-CM | POA: Diagnosis not present

## 2015-04-27 DIAGNOSIS — E559 Vitamin D deficiency, unspecified: Secondary | ICD-10-CM | POA: Diagnosis not present

## 2015-04-27 DIAGNOSIS — E2609 Other primary hyperaldosteronism: Secondary | ICD-10-CM | POA: Diagnosis not present

## 2015-04-27 DIAGNOSIS — E782 Mixed hyperlipidemia: Secondary | ICD-10-CM | POA: Diagnosis not present

## 2015-04-27 DIAGNOSIS — M8589 Other specified disorders of bone density and structure, multiple sites: Secondary | ICD-10-CM | POA: Diagnosis not present

## 2015-04-27 DIAGNOSIS — N183 Chronic kidney disease, stage 3 (moderate): Secondary | ICD-10-CM | POA: Diagnosis not present

## 2015-05-22 DIAGNOSIS — Z01419 Encounter for gynecological examination (general) (routine) without abnormal findings: Secondary | ICD-10-CM | POA: Diagnosis not present

## 2015-07-02 DIAGNOSIS — R700 Elevated erythrocyte sedimentation rate: Secondary | ICD-10-CM | POA: Diagnosis not present

## 2015-07-02 DIAGNOSIS — D863 Sarcoidosis of skin: Secondary | ICD-10-CM | POA: Diagnosis not present

## 2015-07-02 DIAGNOSIS — M545 Low back pain: Secondary | ICD-10-CM | POA: Diagnosis not present

## 2015-07-02 DIAGNOSIS — M3500 Sicca syndrome, unspecified: Secondary | ICD-10-CM | POA: Diagnosis not present

## 2015-07-02 DIAGNOSIS — M150 Primary generalized (osteo)arthritis: Secondary | ICD-10-CM | POA: Diagnosis not present

## 2015-08-10 ENCOUNTER — Other Ambulatory Visit: Payer: Self-pay | Admitting: *Deleted

## 2015-08-10 DIAGNOSIS — I714 Abdominal aortic aneurysm, without rupture, unspecified: Secondary | ICD-10-CM

## 2015-09-25 ENCOUNTER — Ambulatory Visit (HOSPITAL_COMMUNITY)
Admission: RE | Admit: 2015-09-25 | Discharge: 2015-09-25 | Disposition: A | Discharge status: Home / Self Care | Payer: Medicare HMO | Source: Ambulatory Visit | Attending: Vascular Surgery | Admitting: Vascular Surgery

## 2015-09-25 DIAGNOSIS — E079 Disorder of thyroid, unspecified: Secondary | ICD-10-CM | POA: Insufficient documentation

## 2015-09-25 DIAGNOSIS — I714 Abdominal aortic aneurysm, without rupture, unspecified: Secondary | ICD-10-CM

## 2015-09-25 DIAGNOSIS — E785 Hyperlipidemia, unspecified: Secondary | ICD-10-CM | POA: Diagnosis not present

## 2015-09-25 DIAGNOSIS — I10 Essential (primary) hypertension: Secondary | ICD-10-CM | POA: Diagnosis not present

## 2015-09-28 ENCOUNTER — Other Ambulatory Visit (HOSPITAL_COMMUNITY): Payer: Medicare Other

## 2015-10-24 DIAGNOSIS — E782 Mixed hyperlipidemia: Secondary | ICD-10-CM | POA: Diagnosis not present

## 2015-10-24 DIAGNOSIS — E2609 Other primary hyperaldosteronism: Secondary | ICD-10-CM | POA: Diagnosis not present

## 2015-10-24 DIAGNOSIS — E559 Vitamin D deficiency, unspecified: Secondary | ICD-10-CM | POA: Diagnosis not present

## 2015-10-24 DIAGNOSIS — E038 Other specified hypothyroidism: Secondary | ICD-10-CM | POA: Diagnosis not present

## 2015-10-24 DIAGNOSIS — N183 Chronic kidney disease, stage 3 (moderate): Secondary | ICD-10-CM | POA: Diagnosis not present

## 2015-10-30 DIAGNOSIS — M8589 Other specified disorders of bone density and structure, multiple sites: Secondary | ICD-10-CM | POA: Diagnosis not present

## 2015-10-30 DIAGNOSIS — E559 Vitamin D deficiency, unspecified: Secondary | ICD-10-CM | POA: Diagnosis not present

## 2015-10-30 DIAGNOSIS — E2609 Other primary hyperaldosteronism: Secondary | ICD-10-CM | POA: Diagnosis not present

## 2015-10-30 DIAGNOSIS — N183 Chronic kidney disease, stage 3 (moderate): Secondary | ICD-10-CM | POA: Diagnosis not present

## 2015-10-30 DIAGNOSIS — E782 Mixed hyperlipidemia: Secondary | ICD-10-CM | POA: Diagnosis not present

## 2015-10-30 DIAGNOSIS — E038 Other specified hypothyroidism: Secondary | ICD-10-CM | POA: Diagnosis not present

## 2015-12-25 DIAGNOSIS — H2513 Age-related nuclear cataract, bilateral: Secondary | ICD-10-CM | POA: Diagnosis not present

## 2015-12-31 DIAGNOSIS — D863 Sarcoidosis of skin: Secondary | ICD-10-CM | POA: Diagnosis not present

## 2015-12-31 DIAGNOSIS — M3500 Sicca syndrome, unspecified: Secondary | ICD-10-CM | POA: Diagnosis not present

## 2015-12-31 DIAGNOSIS — M150 Primary generalized (osteo)arthritis: Secondary | ICD-10-CM | POA: Diagnosis not present

## 2015-12-31 DIAGNOSIS — M545 Low back pain: Secondary | ICD-10-CM | POA: Diagnosis not present

## 2015-12-31 DIAGNOSIS — Z23 Encounter for immunization: Secondary | ICD-10-CM | POA: Diagnosis not present

## 2016-04-14 DIAGNOSIS — E785 Hyperlipidemia, unspecified: Secondary | ICD-10-CM | POA: Diagnosis not present

## 2016-04-14 DIAGNOSIS — Z0000 Encounter for general adult medical examination without abnormal findings: Secondary | ICD-10-CM | POA: Diagnosis not present

## 2016-04-14 DIAGNOSIS — M1990 Unspecified osteoarthritis, unspecified site: Secondary | ICD-10-CM | POA: Diagnosis not present

## 2016-04-14 DIAGNOSIS — E039 Hypothyroidism, unspecified: Secondary | ICD-10-CM | POA: Diagnosis not present

## 2016-04-14 DIAGNOSIS — I10 Essential (primary) hypertension: Secondary | ICD-10-CM | POA: Diagnosis not present

## 2016-04-14 DIAGNOSIS — E2609 Other primary hyperaldosteronism: Secondary | ICD-10-CM | POA: Diagnosis not present

## 2016-04-14 DIAGNOSIS — Z7982 Long term (current) use of aspirin: Secondary | ICD-10-CM | POA: Diagnosis not present

## 2016-04-14 DIAGNOSIS — Z6826 Body mass index (BMI) 26.0-26.9, adult: Secondary | ICD-10-CM | POA: Diagnosis not present

## 2016-05-01 DIAGNOSIS — E039 Hypothyroidism, unspecified: Secondary | ICD-10-CM | POA: Diagnosis not present

## 2016-05-01 DIAGNOSIS — N183 Chronic kidney disease, stage 3 (moderate): Secondary | ICD-10-CM | POA: Diagnosis not present

## 2016-05-01 DIAGNOSIS — E559 Vitamin D deficiency, unspecified: Secondary | ICD-10-CM | POA: Diagnosis not present

## 2016-05-01 DIAGNOSIS — E782 Mixed hyperlipidemia: Secondary | ICD-10-CM | POA: Diagnosis not present

## 2016-05-06 DIAGNOSIS — E039 Hypothyroidism, unspecified: Secondary | ICD-10-CM | POA: Diagnosis not present

## 2016-05-06 DIAGNOSIS — M8580 Other specified disorders of bone density and structure, unspecified site: Secondary | ICD-10-CM | POA: Diagnosis not present

## 2016-05-06 DIAGNOSIS — N183 Chronic kidney disease, stage 3 (moderate): Secondary | ICD-10-CM | POA: Diagnosis not present

## 2016-05-06 DIAGNOSIS — E2609 Other primary hyperaldosteronism: Secondary | ICD-10-CM | POA: Diagnosis not present

## 2016-05-06 DIAGNOSIS — I151 Hypertension secondary to other renal disorders: Secondary | ICD-10-CM | POA: Diagnosis not present

## 2016-05-06 DIAGNOSIS — E559 Vitamin D deficiency, unspecified: Secondary | ICD-10-CM | POA: Diagnosis not present

## 2016-05-06 DIAGNOSIS — E782 Mixed hyperlipidemia: Secondary | ICD-10-CM | POA: Diagnosis not present

## 2016-06-27 DIAGNOSIS — J339 Nasal polyp, unspecified: Secondary | ICD-10-CM | POA: Diagnosis not present

## 2016-06-27 DIAGNOSIS — J3089 Other allergic rhinitis: Secondary | ICD-10-CM | POA: Diagnosis not present

## 2016-06-27 DIAGNOSIS — J301 Allergic rhinitis due to pollen: Secondary | ICD-10-CM | POA: Diagnosis not present

## 2016-06-27 DIAGNOSIS — R05 Cough: Secondary | ICD-10-CM | POA: Diagnosis not present

## 2016-06-30 DIAGNOSIS — R05 Cough: Secondary | ICD-10-CM | POA: Diagnosis not present

## 2016-06-30 DIAGNOSIS — R700 Elevated erythrocyte sedimentation rate: Secondary | ICD-10-CM | POA: Diagnosis not present

## 2016-06-30 DIAGNOSIS — E663 Overweight: Secondary | ICD-10-CM | POA: Diagnosis not present

## 2016-06-30 DIAGNOSIS — Z6826 Body mass index (BMI) 26.0-26.9, adult: Secondary | ICD-10-CM | POA: Diagnosis not present

## 2016-06-30 DIAGNOSIS — D863 Sarcoidosis of skin: Secondary | ICD-10-CM | POA: Diagnosis not present

## 2016-06-30 DIAGNOSIS — M545 Low back pain: Secondary | ICD-10-CM | POA: Diagnosis not present

## 2016-06-30 DIAGNOSIS — M150 Primary generalized (osteo)arthritis: Secondary | ICD-10-CM | POA: Diagnosis not present

## 2016-08-04 DIAGNOSIS — M5136 Other intervertebral disc degeneration, lumbar region: Secondary | ICD-10-CM | POA: Diagnosis not present

## 2016-08-25 ENCOUNTER — Ambulatory Visit (HOSPITAL_COMMUNITY): Payer: Medicare HMO | Attending: Physical Medicine and Rehabilitation | Admitting: Physical Therapy

## 2016-08-25 DIAGNOSIS — R29898 Other symptoms and signs involving the musculoskeletal system: Secondary | ICD-10-CM

## 2016-08-25 DIAGNOSIS — R2681 Unsteadiness on feet: Secondary | ICD-10-CM | POA: Diagnosis present

## 2016-08-25 DIAGNOSIS — M545 Low back pain: Secondary | ICD-10-CM | POA: Diagnosis not present

## 2016-08-25 DIAGNOSIS — M6281 Muscle weakness (generalized): Secondary | ICD-10-CM

## 2016-08-25 DIAGNOSIS — G8929 Other chronic pain: Secondary | ICD-10-CM | POA: Diagnosis present

## 2016-08-25 NOTE — Therapy (Signed)
[place], [place], [address] Phone: [phone]   Fax:  [phone]  Physical Therapy Evaluation  Patient Details  Name: Cheryl [name] MRN: [id number] Date of Birth: Jan 01, 1951 Referring Provider: Suella Broad   Encounter Date: 08/25/2016      PT End of Session - 08/25/16 1535    Visit Number 1   Number of Visits 13   Date for PT Re-Evaluation 09/15/16   Authorization Type [employer] HMO    Authorization Time Period 08/25/16 to 10/06/16   Authorization - Visit Number 1   Authorization - Number of Visits 10   PT Start Time 2831   PT Stop Time 1345   PT Time Calculation (min) 42 min   Activity Tolerance Patient tolerated treatment well   Behavior During Therapy [facility] for tasks assessed/performed      Past Medical History:  Diagnosis Date  . AAA (abdominal aortic aneurysm)   . Arthritis    osteoarthritis  . Hyperlipidemia   . Hypertension   . Hypoaldosteronism   . Renal disorder    renal insufficiency  . Sarcoidosis   . Thyroid disease    hypothyroidism    Past Surgical History:  Procedure Laterality Date  . ABDOMINAL AORTIC ANEURYSM REPAIR    . BREAST BIOPSY    . RENAL BIOPSY    . renal vein sampling      There were no vitals filed for this visit.       Subjective Assessment - 08/25/16 1304    Subjective Patient arrives stating that her back has been bothering her; within the past year she has had pretty consistent back pain. It is a constant pain. She has a hard time lifting things, she feels like she moves slower and she has a hard time walking a straight line/has some balance concerns, she also cannot stand for long periods. 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 No numbness in bowel or bladder area, she reports some symptoms going down both legs that she thinks is from bursitis.    Pertinent History AAA with history of repair, sarcoidosis, hypothyroidism    How long can you  sit comfortably? unlimited    How long can you stand comfortably? has to "march" in standing for comfort; can only make it 10 minutes before she has to march    How long can you walk comfortably? walking is not too bad but she needs a cart to walk in the grocery store   Patient Stated Goals get rid of pain, improve balance    Currently in Pain? Yes   Pain Score 5    Pain Location Other (Comment)  low back and going down B legs   Pain Orientation Right;Left   Pain Descriptors / Indicators Constant;Dull   Pain Type Chronic pain   Pain Radiating Towards down B LEs on teh side    Pain Onset More than a month ago   Pain Frequency Constant   Aggravating Factors  trying to lift things    Pain Relieving Factors sitting down, using a cart    Effect of Pain on Daily Activities moderate impact             [facility] PT Assessment - 08/25/16 0001      Assessment   Medical Diagnosis low back pain    Referring Provider Suella Broad    Onset Date/Surgical Date --  about 1 year    Next MD Visit Dr.  Ramos after PT    Prior Therapy PT a couple of years ago      Precautions   Precautions Omar   Has the patient fallen in the past 6 months Yes   How many times? 1   Has the patient had a decrease in activity level because of a fear of falling?  No   Is the patient reluctant to leave their home because of a fear of falling?  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     Observation/Other Assessments   Observations SLR test (-) B, FABER (-) B, scour test mild positive R hip      AROM   Lumbar Flexion full range; RFIS increases pain    Lumbar Extension full range, possible hinging; REIS improved pain slightly    Lumbar - Right Side Bend full ROM    Lumbar - Left Side Bend full ROM      Strength   Right  Hip Flexion 3/5   Right Hip Extension 2+/5   Right Hip ABduction 2/5   Left Hip Flexion 3/5   Left Hip Extension 2+/5   Left Hip ABduction 3-/5   Right Knee Flexion 3+/5   Right Knee Extension 3/5   Left Knee Flexion 4-/5   Left Knee Extension 3+/5   Right Ankle Dorsiflexion 5/5   Left Ankle Dorsiflexion 5/5     Flexibility   Hamstrings WNL    Piriformis WNL      Standardized Balance Assessment   Standardized Balance Assessment Dynamic Gait Index     Dynamic Gait Index   Level Surface Normal   Change in Gait Speed Normal   Gait with Horizontal Head Turns Moderate Impairment   Gait with Vertical Head Turns Mild Impairment   Gait and Pivot Turn Mild Impairment   Step Over Obstacle Mild Impairment   Step Around Obstacles Mild Impairment   Steps Moderate Impairment   Total Score 16   DGI comment: possibly related to gross weakness and deconditioning             Objective measurements completed on examination: See above findings.          [facility] Adult PT Treatment/Exercise - 08/25/16 0001      Exercises   Exercises Lumbar     Lumbar Exercises: Standing   Other Standing Lumbar Exercises tandem stance 1x20 seconds      Lumbar Exercises: Seated   Sit to Stand Other (comment)  3 reps, no UEs      Lumbar Exercises: Supine   Bridge 10 reps     Lumbar Exercises: Sidelying   Clam 5 reps   Clam Limitations red TB                 PT Education - 08/25/16 1533    Education provided Yes   Education Details prognosis, POC, exam findings, HEP    Person(s) Educated Patient   Methods Explanation;Demonstration;Handout   Comprehension Verbalized understanding;Returned demonstration;Need further instruction          PT Short Term Goals - 08/25/16 1543      PT SHORT TERM GOAL #1   Title Patient to be able to move through full lumbar ROM without pain in order to show improved muscle strength/core stability and QOL  Time 3   Period Weeks   Status New      PT SHORT TERM GOAL #2   Title Patient to experience pain as being no more than 2/10 in order to improve QOL and functional task tolerance    Time 3   Period Weeks   Status New     PT SHORT TERM GOAL #3   Title Paitent to be participatory in regular walking program, at least 15-20 minutes consecutively, at least 1x/day and 7 days per week, in order to improve LE strength and functional activity tolerance    Time 3   Period Weeks   Status New     PT SHORT TERM GOAL #4   Title Patient to be compliant in correctly performing HEP, to be updated PRN    Time 1   Period Weeks   Status New           PT Long Term Goals - 08/25/16 1545      PT LONG TERM GOAL #1   Title Patient to show MMT as having improved by at least 1 grade in all tested groups in order to reduce pain and improve balance    Time 6   Period Weeks   Status New     PT LONG TERM GOAL #2   Title Patient to score at least 20 on DGI in order to show improved balance/reduced fall risk    Time 6   Period Weeks   Status New     PT LONG TERM GOAL #3   Title Patient to be participatory in regular exercise program, at least 20 minutes in duration and 4 days per week, in order to maintain functional gains and prevent recurrence of symptoms    Time 6   Period Weeks   Status New     PT LONG TERM GOAL #4   Title Patient to report she has been able to go shopping for at least 2 hours without increase in pain or unsteadiness in order to improve QOL    Time 6   Period Weeks   Status New                Plan - 08/25/16 1536    Clinical Impression Statement Patient arrives today stating that her back has been bothering her for about a year now; she reports that when she was taking care of her mother, she did not have a lot of time to keep up with her exercises. Exam reveals severe widespread muscle weakness likely contributing to pain, poor functional activity tolerance, and poor balance as evidenced by DGI 16/24.  Flexibility appears WNL and every hypermobile likely related to reduced muscle tone secondary to severe weakness. Recommend skilled PT services to address functional deficits, reduce fall risk, and assist in design of advanced HEP program for independent use before DC.    History and Personal Factors relevant to plan of care: success with PT in the past, current state of severe deconditioning    Clinical Presentation Stable   Clinical Presentation due to: sedentary lifestyle    Clinical Decision Making Low   Rehab Potential Good   Clinical Impairments Affecting Rehab Potential (+) motivated to participate, success with PT in the past; (-) sedentary lifestyle, recurrent condition    PT Frequency 2x / week   PT Duration 6 weeks   PT Treatment/Interventions ADLs/Self Care Home Management;Biofeedback;DME Instruction;Gait training;Stair training;Functional mobility training;Therapeutic activities;Therapeutic exercise;Balance training;Neuromuscular re-education;Patient/family education;Manual techniques;Energy conservation   PT Next  Visit Plan review initial eval/goals, HEP; strong focus on general strengthening and balance. Discuss regular walking program and personal floor peddle bike for functional activity tolerance  HEP.    PT Home Exercise Plan Eval: bridges with UE pressdown, sidelying clams with red TB, sit to stand no UEs, tandem stance    Consulted and Agree with Plan of Care Patient      Patient will benefit from skilled therapeutic intervention in order to improve the following deficits and impairments:  Improper body mechanics, Pain, Decreased coordination, Postural dysfunction, Decreased activity tolerance, Decreased strength, Decreased balance, Difficulty walking  Visit Diagnosis: Chronic midline low back pain without sciatica - Plan: PT plan of care cert/re-cert  Muscle weakness (generalized) - Plan: PT plan of care cert/re-cert  Unsteadiness on feet - Plan: PT plan of care  cert/re-cert  Other symptoms and signs involving the musculoskeletal system - Plan: PT plan of care cert/re-cert      G-Codes - 94/80/16 1547    Functional Assessment Tool Used (Outpatient Only) Based on skilled clinical assessment of strength, balance, pain patterns, fall risk    Functional Limitation Mobility: Walking and moving around   Mobility: Walking and Moving Around Current Status (P5374) At least 60 percent but less than 80 percent impaired, limited or restricted   Mobility: Walking and Moving Around Goal Status (M2707) At least 40 percent but less than 60 percent impaired, limited or restricted       Problem List There are no active problems to display for this patient.   Deniece Ree PT, DPT [id number]  [place] [address] [place], [place], [address] Phone: [phone]   Fax:  [phone]  Name: Khaleah Duer MRN: [id number] Date of Birth: 1951-02-08

## 2016-08-25 NOTE — Patient Instructions (Signed)
   BRIDGING  While lying on your back, tighten your lower abdominals, squeeze your buttocks and then raise your buttocks off the floor/bed as creating a "Bridge" with your body/lifting as high as you can.  Repeat 10 times, twice a day.    ELASTIC BAND - SIDELYING CLAM-   While lying on your side with your knees bent and an elastic band wrapped around your knees, draw up the top knee while keeping contact of your feet together as shown.   Do not let your pelvis roll back during the lifting movement.    Repeat 5-10 times each leg, twice a day.     SIT TO STAND - NO SUPPORT  Start by scooting close to the front of the chair.  Next, lean forward at your trunk and reach forward with your arms and rise to standing without using your hands to push off from the chair or other object.   Use your arms as a counter-balance by reaching forward when in sitting and lower them as you approach standing.   Repeat 3-5 times, twice per day. As this gets easier, you may increase how many repetitions you are doing.     TANDEM STANCE BALANCE  Stand and balnace in tandem stance. Hold this position for at least 15 seconds, then switch your feet.  Repeat 3 times each side, twice a day.

## 2016-08-28 ENCOUNTER — Ambulatory Visit (HOSPITAL_COMMUNITY): Payer: Medicare HMO | Admitting: Physical Therapy

## 2016-08-28 DIAGNOSIS — M545 Low back pain: Principal | ICD-10-CM

## 2016-08-28 DIAGNOSIS — G8929 Other chronic pain: Secondary | ICD-10-CM

## 2016-08-28 DIAGNOSIS — M6281 Muscle weakness (generalized): Secondary | ICD-10-CM

## 2016-08-28 DIAGNOSIS — R2681 Unsteadiness on feet: Secondary | ICD-10-CM

## 2016-08-28 DIAGNOSIS — R29898 Other symptoms and signs involving the musculoskeletal system: Secondary | ICD-10-CM

## 2016-08-28 NOTE — Therapy (Signed)
[place], [place], [address] Phone: [phone]   Fax:  [phone]  Physical Therapy Treatment  Patient Details  Name: Cheryl [name] MRN: [id number] Date of Birth: [birth year] Referring Provider: Suella Broad   Encounter Date: 08/28/2016      PT End of Session - 08/28/16 1154    Visit Number 2   Number of Visits 13   Date for PT Re-Evaluation 09/15/16   Authorization Type [employer] HMO    Authorization Time Period 08/25/16 to 10/06/16   Authorization - Visit Number 2   Authorization - Number of Visits 10   PT Start Time 1116   PT Stop Time 1157   PT Time Calculation (min) 41 min   Activity Tolerance Patient tolerated treatment well   Behavior During Therapy [facility] for tasks assessed/performed      Past Medical History:  Diagnosis Date  . AAA (abdominal aortic aneurysm)   . Arthritis    osteoarthritis  . Hyperlipidemia   . Hypertension   . Hypoaldosteronism   . Renal disorder    renal insufficiency  . Sarcoidosis   . Thyroid disease    hypothyroidism    Past Surgical History:  Procedure Laterality Date  . ABDOMINAL AORTIC ANEURYSM REPAIR    . BREAST BIOPSY    . RENAL BIOPSY    . renal vein sampling      There were no vitals filed for this visit.      Subjective Assessment - 08/28/16 1118    Subjective Pt reports that things are going ok. She states that her back is bothering her currently, but not too bad (about a 6). She has not been doing any of her exercises lately due to a busy shedule.    Pertinent History AAA with history of repair, sarcoidosis, hypothyroidism    How long can you sit comfortably? unlimited    How long can you stand comfortably? has to "march" in standing for comfort; can only make it 10 minutes before she has to march    How long can you walk comfortably? walking is not too bad but she needs a cart to walk in the grocery store   Patient Stated Goals get rid of  pain, improve balance    Currently in Pain? Yes   Pain Score 6    Pain Location Back   Pain Orientation Right;Left;Lower   Pain Descriptors / Indicators Constant;Aching;Dull   Pain Onset More than a month ago   Pain Frequency Constant   Aggravating Factors  just there   Pain Relieving Factors sitting and resting   Effect of Pain on Daily Activities limited full tolerance to activity             [facility] Adult PT Treatment/Exercise - 08/28/16 0001      Lumbar Exercises: Seated   Long Arc Quad on Chair Both;1 set;10 reps   LAQ on Chair Weights (lbs) 5   LAQ on Chair Limitations cues to maintian slight hip ER   Sit to Stand 10 reps   Sit to Stand Limitations x5 with hands on thighs, x5 reps with BUE reach and red TB around knees to prevent      Lumbar Exercises: Supine   Bridge 10 reps   Bridge Limitations x2 sets, 2nd set with red TB around knees and pillow under hips to improve range of extension   Other Supine Lumbar Exercises supine low trunk rotation x10 reps Lt and Rt  Lumbar Exercises: Sidelying   Clam 10 reps   Clam Limitations x2 sets each with red TB and ab set      Lumbar Exercises: Prone   Other Prone Lumbar Exercises hamstring curl with double red TB x10 reps each             PT Education - 08/28/16 1154    Education provided Yes   Education Details importance of adhering to HEP and benefits in decreasing soreness following today's session; technique with therex    Person(s) Educated Patient   Methods Explanation;Demonstration;Verbal cues   Comprehension Verbalized understanding;Returned demonstration          PT Short Term Goals - 08/25/16 1543      PT SHORT TERM GOAL #1   Title Patient to be able to move through full lumbar ROM without pain in order to show improved muscle strength/core stability and QOL    Time 3   Period Weeks   Status New     PT SHORT TERM GOAL #2   Title Patient to experience pain as being no more than 2/10 in order to  improve QOL and functional task tolerance    Time 3   Period Weeks   Status New     PT SHORT TERM GOAL #3   Title Paitent to be participatory in regular walking program, at least 15-20 minutes consecutively, at least 1x/day and 7 days per week, in order to improve LE strength and functional activity tolerance    Time 3   Period Weeks   Status New     PT SHORT TERM GOAL #4   Title Patient to be compliant in correctly performing HEP, to be updated PRN    Time 1   Period Weeks   Status New           PT Long Term Goals - 08/25/16 1545      PT LONG TERM GOAL #1   Title Patient to show MMT as having improved by at least 1 grade in all tested groups in order to reduce pain and improve balance    Time 6   Period Weeks   Status New     PT LONG TERM GOAL #2   Title Patient to score at least 20 on DGI in order to show improved balance/reduced fall risk    Time 6   Period Weeks   Status New     PT LONG TERM GOAL #3   Title Patient to be participatory in regular exercise program, at least 20 minutes in duration and 4 days per week, in order to maintain functional gains and prevent recurrence of symptoms    Time 6   Period Weeks   Status New     PT LONG TERM GOAL #4   Title Patient to report she has been able to go shopping for at least 2 hours without increase in pain or unsteadiness in order to improve QOL    Time 6   Period Weeks   Status New               Plan - 08/28/16 1157    Clinical Impression Statement Pt arrives today reporting poor HEP adherence over the past couple of days. Session focused on HEP review with therapist making minor adjustments for improved technique. Pt reporting muscle fatigue during the session and required intermittent rest breaks. Ended session, encouraging pt to increase her HEP adherence and reviewing benefits of consistent exercise to improve LE  strength and activity tolerance. Pt reporting fatigue only, and verbalizing understanding of  everything covered during today's session.    Rehab Potential Good   Clinical Impairments Affecting Rehab Potential (+) motivated to participate, success with PT in the past; (-) sedentary lifestyle, recurrent condition    PT Frequency 2x / week   PT Duration 4 weeks   PT Treatment/Interventions ADLs/Self Care Home Management;Biofeedback;DME Instruction;Gait training;Stair training;Functional mobility training;Therapeutic activities;Therapeutic exercise;Balance training;Neuromuscular re-education;Patient/family education;Manual techniques;Energy conservation   PT Next Visit Plan Discuss regular walking program and personal floor peddle bike for functional activity tolerance; continue with basic LE strenghtening program (increasing reps/sets/resistance as able); static balance activity    PT Home Exercise Plan Eval: bridges with UE pressdown, sidelying clams with red TB, sit to stand no UEs, tandem stance    Consulted and Agree with Plan of Care Patient      Patient will benefit from skilled therapeutic intervention in order to improve the following deficits and impairments:  Improper body mechanics, Pain, Decreased coordination, Postural dysfunction, Decreased activity tolerance, Decreased strength, Decreased balance, Difficulty walking  Visit Diagnosis: Chronic midline low back pain without sciatica  Muscle weakness (generalized)  Unsteadiness on feet  Other symptoms and signs involving the musculoskeletal system     Problem List There are no active problems to display for this patient.  12:11 PM,08/28/16 Elly Modena PT, DPT [facility] [place] [address] [place], [place], [address] Phone: [phone]   Fax:  [phone]  Name: Tauriel Scronce MRN: [id number] Date of Birth: 1951/01/07

## 2016-09-01 ENCOUNTER — Ambulatory Visit (HOSPITAL_COMMUNITY): Payer: Medicare HMO | Admitting: Physical Therapy

## 2016-09-01 DIAGNOSIS — R2681 Unsteadiness on feet: Secondary | ICD-10-CM

## 2016-09-01 DIAGNOSIS — M545 Low back pain: Principal | ICD-10-CM

## 2016-09-01 DIAGNOSIS — G8929 Other chronic pain: Secondary | ICD-10-CM

## 2016-09-01 DIAGNOSIS — R29898 Other symptoms and signs involving the musculoskeletal system: Secondary | ICD-10-CM

## 2016-09-01 DIAGNOSIS — M6281 Muscle weakness (generalized): Secondary | ICD-10-CM

## 2016-09-01 NOTE — Therapy (Signed)
[place], [place], [address] Phone: [phone]   Fax:  [phone]  Physical Therapy Treatment  Patient Details  Name: Cheryl [name] MRN: [id number] Date of Birth: 03-Apr-1950 Referring Provider: Suella Broad   Encounter Date: 09/01/2016      PT End of Session - 09/01/16 1113    Visit Number 3   Number of Visits 13   Date for PT Re-Evaluation 09/15/16   Authorization Type [employer] HMO    Authorization Time Period 08/25/16 to 10/06/16   Authorization - Visit Number 3   Authorization - Number of Visits 10   PT Start Time 9767   PT Stop Time 1110   PT Time Calculation (min) 38 min   Activity Tolerance Patient tolerated treatment well;Patient limited by fatigue   Behavior During Therapy [facility] for tasks assessed/performed      Past Medical History:  Diagnosis Date  . AAA (abdominal aortic aneurysm)   . Arthritis    osteoarthritis  . Hyperlipidemia   . Hypertension   . Hypoaldosteronism   . Renal disorder    renal insufficiency  . Sarcoidosis   . Thyroid disease    hypothyroidism    Past Surgical History:  Procedure Laterality Date  . ABDOMINAL AORTIC ANEURYSM REPAIR    . BREAST BIOPSY    . RENAL BIOPSY    . renal vein sampling      There were no vitals filed for this visit.      Subjective Assessment - 09/01/16 1035    Subjective Patient arrives reporting poor HEP compliance and confusion that she was supposed to be performing them every day. She is feeling good today.    Pertinent History AAA with history of repair, sarcoidosis, hypothyroidism    Currently in Pain? Yes   Pain Score 4    Pain Location Back   Pain Orientation Right;Left                         [facility] Adult PT Treatment/Exercise - 09/01/16 0001      Lumbar Exercises: Standing   Heel Raises 10 reps   Heel Raises Limitations heel and toe    Other Standing Lumbar Exercises knee flexion curls 1x10     Other Standing Lumbar Exercises forward and lateral step ups x5 B 4 inch step U HHA      Lumbar Exercises: Seated   Long Arc Quad on Chair Both;1 set;10 reps   LAQ on Chair Weights (lbs) 5   LAQ on Chair Limitations also seated HS curls with 5# 1x10 B    Sit to Stand 15 reps   Sit to Stand Limitations hands on thighs      Lumbar Exercises: Supine   Bridge 15 reps;1 second   Straight Leg Raise 10 reps   Straight Leg Raises Limitations core set/eccentric lower    Other Supine Lumbar Exercises supine low trunk rotation x10 reps Lt and Rt      Lumbar Exercises: Sidelying   Clam 10 reps   Clam Limitations red TB    Hip Abduction Limitations attempted, unable in gravity present position      Lumbar Exercises: Prone   Straight Leg Raise 10 reps   Straight Leg Raises Limitations knee bent              Balance Exercises - 09/01/16 1107      Balance Exercises: Standing   SLS Eyes open;Solid surface;3  reps;10 secs           PT Education - 09/01/16 1112    Education provided Yes   Education Details encouraged regular use of TM and personal exercise bike to tolerance, HEP compliance    Person(s) Educated Patient   Methods Explanation   Comprehension Verbalized understanding          PT Short Term Goals - 08/25/16 1543      PT SHORT TERM GOAL #1   Title Patient to be able to move through full lumbar ROM without pain in order to show improved muscle strength/core stability and QOL    Time 3   Period Weeks   Status New     PT SHORT TERM GOAL #2   Title Patient to experience pain as being no more than 2/10 in order to improve QOL and functional task tolerance    Time 3   Period Weeks   Status New     PT SHORT TERM GOAL #3   Title Paitent to be participatory in regular walking program, at least 15-20 minutes consecutively, at least 1x/day and 7 days per week, in order to improve LE strength and functional activity tolerance    Time 3   Period Weeks   Status New      PT SHORT TERM GOAL #4   Title Patient to be compliant in correctly performing HEP, to be updated PRN    Time 1   Period Weeks   Status New           PT Long Term Goals - 08/25/16 1545      PT LONG TERM GOAL #1   Title Patient to show MMT as having improved by at least 1 grade in all tested groups in order to reduce pain and improve balance    Time 6   Period Weeks   Status New     PT LONG TERM GOAL #2   Title Patient to score at least 20 on DGI in order to show improved balance/reduced fall risk    Time 6   Period Weeks   Status New     PT LONG TERM GOAL #3   Title Patient to be participatory in regular exercise program, at least 20 minutes in duration and 4 days per week, in order to maintain functional gains and prevent recurrence of symptoms    Time 6   Period Weeks   Status New     PT LONG TERM GOAL #4   Title Patient to report she has been able to go shopping for at least 2 hours without increase in pain or unsteadiness in order to improve QOL    Time 6   Period Weeks   Status New               Plan - 09/01/16 1113    Clinical Impression Statement Patient arrives today continuing to report poor compliance with HEP, and appears shocked at instructions that HEP needs to be performed every day. Continued with general POC of functional strengthening today, continuing to note poor functional activity tolerance this session. Discussed regular walking program and/or seated peddler bike for increased activity at home, however patient will more than likely require encouragement regarding regular physical activity.    Rehab Potential Good   Clinical Impairments Affecting Rehab Potential (+) motivated to participate, success with PT in the past; (-) sedentary lifestyle, recurrent condition    PT Frequency 2x / week   PT Duration  4 weeks   PT Treatment/Interventions ADLs/Self Care Home Management;Biofeedback;DME Instruction;Gait training;Stair training;Functional  mobility training;Therapeutic activities;Therapeutic exercise;Balance training;Neuromuscular re-education;Patient/family education;Manual techniques;Energy conservation   PT Next Visit Plan continue to encourage use of TM/bike at home; continue progressiong of basic strength program. Balance.    PT Home Exercise Plan Eval: bridges with UE pressdown, sidelying clams with red TB, sit to stand no UEs, tandem stance    Consulted and Agree with Plan of Care Patient      Patient will benefit from skilled therapeutic intervention in order to improve the following deficits and impairments:  Improper body mechanics, Pain, Decreased coordination, Postural dysfunction, Decreased activity tolerance, Decreased strength, Decreased balance, Difficulty walking  Visit Diagnosis: Chronic midline low back pain without sciatica  Muscle weakness (generalized)  Other symptoms and signs involving the musculoskeletal system  Unsteadiness on feet     Problem List There are no active problems to display for this patient.   Deniece Ree PT, DPT [id number]  [facility] [address] [place], [place], [address] Phone: [phone]   Fax:  [phone]  Name: Cheryl [name] MRN: [id number] Date of Birth: [birth year]

## 2016-09-04 ENCOUNTER — Ambulatory Visit (HOSPITAL_COMMUNITY): Payer: Medicare HMO

## 2016-09-04 DIAGNOSIS — R29898 Other symptoms and signs involving the musculoskeletal system: Secondary | ICD-10-CM

## 2016-09-04 DIAGNOSIS — M6281 Muscle weakness (generalized): Secondary | ICD-10-CM

## 2016-09-04 DIAGNOSIS — G8929 Other chronic pain: Secondary | ICD-10-CM

## 2016-09-04 DIAGNOSIS — M545 Low back pain: Principal | ICD-10-CM

## 2016-09-04 DIAGNOSIS — R2681 Unsteadiness on feet: Secondary | ICD-10-CM

## 2016-09-04 NOTE — Therapy (Signed)
[name], [place], [address] Phone: [phone]   Fax:  [phone]  Physical Therapy Treatment  Patient Details  Name: Cheryl [name] MRN: [id number] Date of Birth: 03-19-1950 Referring Provider: Suella Broad   Encounter Date: 09/04/2016      PT End of Session - 09/04/16 1128    Visit Number 4   Number of Visits 13   Date for PT Re-Evaluation 09/15/16   Authorization Type [employer] HMO    Authorization Time Period 08/25/16 to 10/06/16   Authorization - Visit Number 4   Authorization - Number of Visits 10   PT Start Time 1122   PT Stop Time 1204   PT Time Calculation (min) 42 min   Activity Tolerance Patient tolerated treatment well;Patient limited by fatigue  Reports pain reduced at EOS   Behavior During Therapy [facility] for tasks assessed/performed      Past Medical History:  Diagnosis Date  . AAA (abdominal aortic aneurysm)   . Arthritis    osteoarthritis  . Hyperlipidemia   . Hypertension   . Hypoaldosteronism   . Renal disorder    renal insufficiency  . Sarcoidosis   . Thyroid disease    hypothyroidism    Past Surgical History:  Procedure Laterality Date  . ABDOMINAL AORTIC ANEURYSM REPAIR    . BREAST BIOPSY    . RENAL BIOPSY    . renal vein sampling      There were no vitals filed for this visit.      Subjective Assessment - 09/04/16 1125    Subjective Pt reports she has increased pain today, reoprts she has complete the HEP last night and wonders if related.  Has not began walking program or riding bike.  Pain scale    Pertinent History AAA with history of repair, sarcoidosis, hypothyroidism    Patient Stated Goals get rid of pain, improve balance    Currently in Pain? Yes   Pain Score 6    Pain Location Back   Pain Orientation Right;Left;Lower   Pain Descriptors / Indicators Aching   Pain Type Chronic pain   Pain Radiating Towards no radicular symptoms   Pain Onset More  than a month ago   Pain Frequency Constant   Aggravating Factors  just there   Pain Relieving Factors sitting and resting   Effect of Pain on Daily Activities limited full tolerance to activity            [facility] Adult PT Treatment/Exercise - 09/04/16 0001      Lumbar Exercises: Supine   Bent Knee Raise 10 reps;3 seconds   Bent Knee Raise Limitations cueing for core set   Bridge 15 reps;1 second   Other Supine Lumbar Exercises supine low trunk rotation x10 reps Lt and Rt      Lumbar Exercises: Sidelying   Clam 10 reps   Clam Limitations red TB    Hip Abduction 10 reps   Hip Abduction Limitations complete infront of mirror, AA initially wiht Lt LE then able to complete I, decreased range due to weakness     Lumbar Exercises: Prone   Straight Leg Raise 10 reps  2sets 5 reps due to fatigue   Straight Leg Raises Limitations knee bent    Other Prone Lumbar Exercises heel squeeze 10x 5"             Balance Exercises - 09/04/16 1207      Balance Exercises: Standing   SLS  Eyes open;3 reps  Rt 12", Lt 28" max of 3   Sidestepping 1 rep;Theraband  RTB             PT Short Term Goals - 08/25/16 1543      PT SHORT TERM GOAL #1   Title Patient to be able to move through full lumbar ROM without pain in order to show improved muscle strength/core stability and QOL    Time 3   Period Weeks   Status New     PT SHORT TERM GOAL #2   Title Patient to experience pain as being no more than 2/10 in order to improve QOL and functional task tolerance    Time 3   Period Weeks   Status New     PT SHORT TERM GOAL #3   Title Paitent to be participatory in regular walking program, at least 15-20 minutes consecutively, at least 1x/day and 7 days per week, in order to improve LE strength and functional activity tolerance    Time 3   Period Weeks   Status New     PT SHORT TERM GOAL #4   Title Patient to be compliant in correctly performing HEP, to be updated PRN    Time 1    Period Weeks   Status New           PT Long Term Goals - 08/25/16 1545      PT LONG TERM GOAL #1   Title Patient to show MMT as having improved by at least 1 grade in all tested groups in order to reduce pain and improve balance    Time 6   Period Weeks   Status New     PT LONG TERM GOAL #2   Title Patient to score at least 20 on DGI in order to show improved balance/reduced fall risk    Time 6   Period Weeks   Status New     PT LONG TERM GOAL #3   Title Patient to be participatory in regular exercise program, at least 20 minutes in duration and 4 days per week, in order to maintain functional gains and prevent recurrence of symptoms    Time 6   Period Weeks   Status New     PT LONG TERM GOAL #4   Title Patient to report she has been able to go shopping for at least 2 hours without increase in pain or unsteadiness in order to improve QOL    Time 6   Period Weeks   Status New               Plan - 09/04/16 1135    Clinical Impression Statement Pt stated compliance with HEP last night with increased pain noted today.  Reviewed importance of compliance with HEP and beginning a walking program for maximal benefits.  Continued session focus with proximal strengthening with additional core stability exercises to address back pain.  Cueing for stabiltiy and control through session.  Added sidestepping for glut med strengthening.   Rehab Potential Good   Clinical Impairments Affecting Rehab Potential (+) motivated to participate, success with PT in the past; (-) sedentary lifestyle, recurrent condition    PT Frequency 2x / week   PT Duration 4 weeks   PT Treatment/Interventions ADLs/Self Care Home Management;Biofeedback;DME Instruction;Gait training;Stair training;Functional mobility training;Therapeutic activities;Therapeutic exercise;Balance training;Neuromuscular re-education;Patient/family education;Manual techniques;Energy conservation   PT Next Visit Plan continue to  encourage HEP and use of TM/bike at home; continue progressiong of  basic strength program. Balance.    PT Home Exercise Plan Eval: bridges with UE pressdown, sidelying clams with red TB, sit to stand no UEs, tandem stance       Patient will benefit from skilled therapeutic intervention in order to improve the following deficits and impairments:  Improper body mechanics, Pain, Decreased coordination, Postural dysfunction, Decreased activity tolerance, Decreased strength, Decreased balance, Difficulty walking  Visit Diagnosis: Chronic midline low back pain without sciatica  Muscle weakness (generalized)  Other symptoms and signs involving the musculoskeletal system  Unsteadiness on feet     Problem List There are no active problems to display for this patient.  [address] LPTA; [place]  Aldona Lento 09/04/2016, 12:12 PM  [facility] [address] [place], [place], [address] Phone: [phone]   Fax:  [phone]  Name: Evana Runnels MRN: [id number] Date of Birth: [birth year]

## 2016-09-08 ENCOUNTER — Ambulatory Visit (HOSPITAL_COMMUNITY): Payer: Medicare HMO | Admitting: Physical Therapy

## 2016-09-08 DIAGNOSIS — R2681 Unsteadiness on feet: Secondary | ICD-10-CM

## 2016-09-08 DIAGNOSIS — M545 Low back pain: Principal | ICD-10-CM

## 2016-09-08 DIAGNOSIS — G8929 Other chronic pain: Secondary | ICD-10-CM

## 2016-09-08 DIAGNOSIS — R29898 Other symptoms and signs involving the musculoskeletal system: Secondary | ICD-10-CM

## 2016-09-08 DIAGNOSIS — M6281 Muscle weakness (generalized): Secondary | ICD-10-CM

## 2016-09-08 NOTE — Patient Instructions (Signed)
   SINGLE KNEE TO CHEST STRETCH - [place]  While Lying on your back, hold your knee and gently pull it up towards your chest.  Hold for a slow count of 10 seconds and then relax.  Repeat 3 times each leg, twice a day.    DOUBLE KNEE TO CHEST STRETCH - [facility]  While Lying on your back,  hold your knees and gently pull them up towards your chest.  Hold for 30 seconds and relax.  Repeat 3 times, twice a day.    MOIST HEAT   Have your shirt plus a doubled towel between a moist heat pack and your skin.   Place the heat pack on the painful area of your back.  Leave moist heat on for no more than 10-15 minutes.  Repeat as needed through the day.

## 2016-09-08 NOTE — Therapy (Signed)
[place], [place], [address] Phone: [phone]   Fax:  [phone]  Physical Therapy Treatment  Patient Details  Name: Cheryl [name] MRN: [id number] Date of Birth: 03/06/1950 Referring Provider: Suella Broad   Encounter Date: 09/08/2016      PT End of Session - 09/08/16 1206    Visit Number 5   Number of Visits 13   Date for PT Re-Evaluation 09/15/16   Authorization Type [employer] HMO    Authorization Time Period 08/25/16 to 10/06/16   Authorization - Visit Number 5   Authorization - Number of Visits 10   PT Start Time 1119   PT Stop Time 1158   PT Time Calculation (min) 39 min   Activity Tolerance Patient tolerated treatment well;Patient limited by fatigue   Behavior During Therapy [facility] for tasks assessed/performed      Past Medical History:  Diagnosis Date  . AAA (abdominal aortic aneurysm)   . Arthritis    osteoarthritis  . Hyperlipidemia   . Hypertension   . Hypoaldosteronism   . Renal disorder    renal insufficiency  . Sarcoidosis   . Thyroid disease    hypothyroidism    Past Surgical History:  Procedure Laterality Date  . ABDOMINAL AORTIC ANEURYSM REPAIR    . BREAST BIOPSY    . RENAL BIOPSY    . renal vein sampling      There were no vitals filed for this visit.      Subjective Assessment - 09/08/16 1121    Subjective Patient arrives stating that she has increased pain today and ever since last session; she feels that her pain medicine is the only thing that will touch the pain.  She has not walked or rode her bike because her back has hurt, she has not done.    Pertinent History AAA with history of repair, sarcoidosis, hypothyroidism    Patient Stated Goals get rid of pain, improve balance    Currently in Pain? Yes   Pain Score 3    Pain Location Back   Pain Orientation Right;Left            [facility] PT Assessment - 09/08/16 0001      Observation/Other Assessments   Observations scoulr test positive, FABER (+)                     [facility] Adult PT Treatment/Exercise - 09/08/16 0001      Lumbar Exercises: Stretches   Single Knee to Chest Stretch 1 rep;30 seconds   Single Knee to Chest Stretch Limitations supine    Double Knee to Chest Stretch 1 rep;30 seconds   Lower Trunk Rotation Limitations attempted but unable to tolerate due to pain      Lumbar Exercises: Standing   Other Standing Lumbar Exercises seated marches 1x10 with core set; trunk rotations with cone targets; lateral crunches 1x15   Other Standing Lumbar Exercises knee flexion/hamstring curls 1x15 B      Manual Therapy   Manual Therapy Soft tissue mobilization   Manual therapy comments performed separately from all other skilled services    Soft tissue mobilization R parapinals and TFL                 PT Education - 09/08/16 1205    Education provided Yes   Education Details education regarding use of moist heat, HEP updates, role of exercise in muscle strengthening to assist in reducing back pain,  imortance of regular and consistent exercise    Person(s) Educated Patient   Methods Explanation   Comprehension Verbalized understanding          PT Short Term Goals - 08/25/16 1543      PT SHORT TERM GOAL #1   Title Patient to be able to move through full lumbar ROM without pain in order to show improved muscle strength/core stability and QOL    Time 3   Period Weeks   Status New     PT SHORT TERM GOAL #2   Title Patient to experience pain as being no more than 2/10 in order to improve QOL and functional task tolerance    Time 3   Period Weeks   Status New     PT SHORT TERM GOAL #3   Title Paitent to be participatory in regular walking program, at least 15-20 minutes consecutively, at least 1x/day and 7 days per week, in order to improve LE strength and functional activity tolerance    Time 3   Period Weeks   Status New     PT SHORT TERM GOAL #4    Title Patient to be compliant in correctly performing HEP, to be updated PRN    Time 1   Period Weeks   Status New           PT Long Term Goals - 08/25/16 1545      PT LONG TERM GOAL #1   Title Patient to show MMT as having improved by at least 1 grade in all tested groups in order to reduce pain and improve balance    Time 6   Period Weeks   Status New     PT LONG TERM GOAL #2   Title Patient to score at least 20 on DGI in order to show improved balance/reduced fall risk    Time 6   Period Weeks   Status New     PT LONG TERM GOAL #3   Title Patient to be participatory in regular exercise program, at least 20 minutes in duration and 4 days per week, in order to maintain functional gains and prevent recurrence of symptoms    Time 6   Period Weeks   Status New     PT LONG TERM GOAL #4   Title Patient to report she has been able to go shopping for at least 2 hours without increase in pain or unsteadiness in order to improve QOL    Time 6   Period Weeks   Status New               Plan - 09/08/16 1206    Clinical Impression Statement Patient arrives today continuing to complain of back pain and continuing to report that she has not performed walking program or bike, she has only been doing her HEP every other day because "its hot and my back hurts". Education provided regarding importance of compliance with HEP and activity recommendations, possible role of chronic deconditioning and weakness in pain. Otherwise trialed lumbar stretches/mobility exercises before continuing with functional strengthening this session.    Rehab Potential Good   Clinical Impairments Affecting Rehab Potential (+) motivated to participate, success with PT in the past; (-) sedentary lifestyle, recurrent condition    PT Frequency 2x / week   PT Duration 4 weeks   PT Treatment/Interventions ADLs/Self Care Home Management;Biofeedback;DME Instruction;Gait training;Stair training;Functional mobility  training;Therapeutic activities;Therapeutic exercise;Balance training;Neuromuscular re-education;Patient/family education;Manual techniques;Energy conservation   PT Next Visit  Plan F/U on HEP updates, continue to encourage regular activity. Continue STM. Strength and balance.    PT Home Exercise Plan Eval: bridges with UE pressdown, sidelying clams with red TB, sit to stand no UEs, tandem stance; 7/16 SKTC, DKTC, moist heat    Consulted and Agree with Plan of Care Patient      Patient will benefit from skilled therapeutic intervention in order to improve the following deficits and impairments:  Improper body mechanics, Pain, Decreased coordination, Postural dysfunction, Decreased activity tolerance, Decreased strength, Decreased balance, Difficulty walking  Visit Diagnosis: Chronic midline low back pain without sciatica  Muscle weakness (generalized)  Other symptoms and signs involving the musculoskeletal system  Unsteadiness on feet     Problem List There are no active problems to display for this patient.   Deniece Ree PT, DPT [phone]  [facility] [address] [place], [place], [address] Phone: [phone]   Fax:  [phone]  Name: Cheryl [name] MRN: [id number] Date of Birth: 06-Aug-1950

## 2016-09-11 ENCOUNTER — Ambulatory Visit (HOSPITAL_COMMUNITY): Payer: Medicare HMO

## 2016-09-11 DIAGNOSIS — G8929 Other chronic pain: Secondary | ICD-10-CM

## 2016-09-11 DIAGNOSIS — M545 Low back pain: Principal | ICD-10-CM

## 2016-09-11 DIAGNOSIS — M6281 Muscle weakness (generalized): Secondary | ICD-10-CM

## 2016-09-11 DIAGNOSIS — R29898 Other symptoms and signs involving the musculoskeletal system: Secondary | ICD-10-CM

## 2016-09-11 DIAGNOSIS — R2681 Unsteadiness on feet: Secondary | ICD-10-CM

## 2016-09-11 NOTE — Therapy (Signed)
[place], [place], [address] Phone: [phone]   Fax:  [phone]  Physical Therapy Treatment  Patient Details  Name: Ercilia Bettinger MRN: [id number] Date of Birth: 03/28/1950 Referring Provider: Suella Broad   Encounter Date: 09/11/2016      PT End of Session - 09/11/16 1125    Visit Number 6   Number of Visits 13   Date for PT Re-Evaluation 09/15/16   Authorization Type [employer] HMO    Authorization Time Period 08/25/16 to 10/06/16   Authorization - Visit Number 6   Authorization - Number of Visits 10   PT Start Time 7035   PT Stop Time 1203   PT Time Calculation (min) 45 min      Past Medical History:  Diagnosis Date  . AAA (abdominal aortic aneurysm)   . Arthritis    osteoarthritis  . Hyperlipidemia   . Hypertension   . Hypoaldosteronism   . Renal disorder    renal insufficiency  . Sarcoidosis   . Thyroid disease    hypothyroidism    Past Surgical History:  Procedure Laterality Date  . ABDOMINAL AORTIC ANEURYSM REPAIR    . BREAST BIOPSY    . RENAL BIOPSY    . renal vein sampling      There were no vitals filed for this visit.      Subjective Assessment - 09/11/16 1122    Subjective Pt reports she has not began new HEP due to pain.  Reports she walked on her treadmile yesterday and able to walk .6 in 20 minutes.  No reports of pain currently, took 2 pills prior session   Patient Stated Goals get rid of pain, improve balance    Currently in Pain? No/denies                         [facility] Adult PT Treatment/Exercise - 09/11/16 0001      Lumbar Exercises: Stretches   Single Knee to Chest Stretch 2 reps;30 seconds   Single Knee to Chest Stretch Limitations supine    Double Knee to Chest Stretch 1 rep;30 seconds   Lower Trunk Rotation 5 reps;10 seconds   Lower Trunk Rotation Limitations encouraged to stay wihtin tolerance, reports stretch not pain able to  increase each direction with reps     Lumbar Exercises: Standing   Other Standing Lumbar Exercises seated marches on dynadisc 1x10 with core set; trunk rotations with cone targets; lateral crunches 1x15   Other Standing Lumbar Exercises knee flexion/hamstring curls 1x15 B      Lumbar Exercises: Supine   Clam 10 reps;3 seconds   Clam Limitations RTB and cueing for stability   Bridge 15 reps;1 second   Bridge Limitations RTB around knee     Manual Therapy   Manual Therapy Soft tissue mobilization   Manual therapy comments performed separately from all other skilled services    Soft tissue mobilization Rt paraspinals, QL and TFL in prone                  PT Short Term Goals - 08/25/16 1543      PT SHORT TERM GOAL #1   Title Patient to be able to move through full lumbar ROM without pain in order to show improved muscle strength/core stability and QOL    Time 3   Period Weeks   Status New     PT SHORT TERM GOAL #2  Title Patient to experience pain as being no more than 2/10 in order to improve QOL and functional task tolerance    Time 3   Period Weeks   Status New     PT SHORT TERM GOAL #3   Title Paitent to be participatory in regular walking program, at least 15-20 minutes consecutively, at least 1x/day and 7 days per week, in order to improve LE strength and functional activity tolerance    Time 3   Period Weeks   Status New     PT SHORT TERM GOAL #4   Title Patient to be compliant in correctly performing HEP, to be updated PRN    Time 1   Period Weeks   Status New           PT Long Term Goals - 08/25/16 1545      PT LONG TERM GOAL #1   Title Patient to show MMT as having improved by at least 1 grade in all tested groups in order to reduce pain and improve balance    Time 6   Period Weeks   Status New     PT LONG TERM GOAL #2   Title Patient to score at least 20 on DGI in order to show improved balance/reduced fall risk    Time 6   Period Weeks    Status New     PT LONG TERM GOAL #3   Title Patient to be participatory in regular exercise program, at least 20 minutes in duration and 4 days per week, in order to maintain functional gains and prevent recurrence of symptoms    Time 6   Period Weeks   Status New     PT LONG TERM GOAL #4   Title Patient to report she has been able to go shopping for at least 2 hours without increase in pain or unsteadiness in order to improve QOL    Time 6   Period Weeks   Status New               Plan - 09/11/16 1140    Clinical Impression Statement Pt reports she has not began new HEP though did begin cardio or treadmile.  Session focus on improving lumbar mobility and proximal strengthening.  Added dynadisc with seated rotation for core strengthening with increased difficutly noted, no reports of pain with any therex activity.  EOS with manual to address soft tissue restrictions for pain control and mobilty.  No reports of pain at EOS.     Clinical Impairments Affecting Rehab Potential (+) motivated to participate, success with PT in the past; (-) sedentary lifestyle, recurrent condition    PT Frequency 2x / week   PT Duration 4 weeks   PT Treatment/Interventions ADLs/Self Care Home Management;Biofeedback;DME Instruction;Gait training;Stair training;Functional mobility training;Therapeutic activities;Therapeutic exercise;Balance training;Neuromuscular re-education;Patient/family education;Manual techniques;Energy conservation   PT Next Visit Plan Reassess next session    PT Home Exercise Plan Eval: bridges with UE pressdown, sidelying clams with red TB, sit to stand no UEs, tandem stance; 7/16 SKTC, DKTC, moist heat       Patient will benefit from skilled therapeutic intervention in order to improve the following deficits and impairments:  Improper body mechanics, Pain, Decreased coordination, Postural dysfunction, Decreased activity tolerance, Decreased strength, Decreased balance,  Difficulty walking  Visit Diagnosis: Chronic midline low back pain without sciatica  Muscle weakness (generalized)  Other symptoms and signs involving the musculoskeletal system  Unsteadiness on feet     Problem  List There are no active problems to display for this patient.  [address], LPTA; [place]  Aldona Lento 09/11/2016, 12:09 PM  [place], [place], [address] Phone: [phone]   Fax:  [phone]  Name: Jala Dundon MRN: [id number] Date of Birth: Dec 10, 1950

## 2016-09-15 ENCOUNTER — Ambulatory Visit (HOSPITAL_COMMUNITY): Payer: Medicare HMO | Admitting: Physical Therapy

## 2016-09-15 DIAGNOSIS — M545 Low back pain: Principal | ICD-10-CM

## 2016-09-15 DIAGNOSIS — R2681 Unsteadiness on feet: Secondary | ICD-10-CM

## 2016-09-15 DIAGNOSIS — R29898 Other symptoms and signs involving the musculoskeletal system: Secondary | ICD-10-CM

## 2016-09-15 DIAGNOSIS — G8929 Other chronic pain: Secondary | ICD-10-CM

## 2016-09-15 DIAGNOSIS — M6281 Muscle weakness (generalized): Secondary | ICD-10-CM

## 2016-09-15 NOTE — Therapy (Signed)
[place], [place], [address] Phone: [phone]   Fax:  [phone]  Physical Therapy Treatment (Re-Assessment)  Patient Details  Name: Cheryl [name] MRN: [id number] Date of Birth: April 14, 1950 Referring Provider: Suella Broad   Encounter Date: 09/15/2016      PT End of Session - 09/15/16 1359    Visit Number 7   Number of Visits 13   Date for PT Re-Evaluation 10/02/16   Authorization Type [employer] HMO (G-codes done 7th session)   Authorization Time Period 08/25/16 to 10/06/16   Authorization - Visit Number 7   Authorization - Number of Visits 17   PT Start Time 1301   PT Stop Time 1340   PT Time Calculation (min) 39 min   Equipment Utilized During Treatment Gait belt   Activity Tolerance Patient tolerated treatment well;Patient limited by fatigue   Behavior During Therapy [facility] for tasks assessed/performed      Past Medical History:  Diagnosis Date  . AAA (abdominal aortic aneurysm)   . Arthritis    osteoarthritis  . Hyperlipidemia   . Hypertension   . Hypoaldosteronism   . Renal disorder    renal insufficiency  . Sarcoidosis   . Thyroid disease    hypothyroidism    Past Surgical History:  Procedure Laterality Date  . ABDOMINAL AORTIC ANEURYSM REPAIR    . BREAST BIOPSY    . RENAL BIOPSY    . renal vein sampling      There were no vitals filed for this visit.      Subjective Assessment - 09/15/16 1303    Subjective Patient arrives stating that she has not been feeling good the past couple of days due to the weather most likely; she recently was able to get on her treadmilll. She did not do anything this weekend because "I did not feel like it".    Pertinent History AAA with history of repair, sarcoidosis, hypothyroidism    How long can you sit comfortably? 7/23- unlimited    How long can you stand comfortably? 7/23- if she's moving, no problem; can make it about 15-20 minutes before  she needs to start marching   How long can you walk comfortably? 7/23- 1/2 mile    Patient Stated Goals get rid of pain, improve balance    Currently in Pain? Yes   Pain Score 5    Pain Location Back   Pain Orientation Right;Left   Pain Descriptors / Indicators Aching   Pain Type Chronic pain   Pain Radiating Towards none    Pain Onset More than a month ago   Pain Frequency Constant   Aggravating Factors  bad weather   Pain Relieving Factors heat pad    Effect of Pain on Daily Activities moderate impact             [facility] PT Assessment - 09/15/16 0001      AROM   Lumbar Flexion full range no pain    Lumbar Extension full range no pain    Lumbar - Right Side [facility] but pulling    Lumbar - Left Side [facility] but pulling      Strength   Right Hip Flexion 4/5   Right Hip Extension 3+/5   Right Hip ABduction 3-/5   Left Hip Flexion 3+/5   Left Hip Extension 3-/5   Left Hip ABduction 4/5   Right Knee Flexion 4+/5   Right Knee Extension 5/5  Left Knee Flexion 4+/5   Left Knee Extension 5/5   Right Ankle Dorsiflexion 5/5   Left Ankle Dorsiflexion 5/5     Dynamic Gait Index   Level Surface Normal   Change in Gait Speed Normal   Gait with Horizontal Head Turns Mild Impairment   Gait with Vertical Head Turns Moderate Impairment   Gait and Pivot Turn Mild Impairment   Step Over Obstacle Normal   Step Around Obstacles Normal   Steps Mild Impairment   Total Score 19                     [facility] Adult PT Treatment/Exercise - 09/15/16 0001      Lumbar Exercises: Standing   Forward Lunge 15 reps   Forward Lunge Limitations 6 inch box    Other Standing Lumbar Exercises forward and lateral step ups 4 inch box 1x15 B                 PT Education - 09/15/16 1359    Education provided Yes   Education Details extensive education regarding use of exercise as medicine, compliance with HEP and exercise recommendations; progress with PT thus far, POC moving  forward    Person(s) Educated Patient   Methods Explanation   Comprehension Verbalized understanding          PT Short Term Goals - 09/15/16 1318      PT SHORT TERM GOAL #1   Title Patient to be able to move through full lumbar ROM without pain in order to show improved muscle strength/core stability and QOL    Baseline 7/23- full ROM, muscles stretching but no pain    Time 3   Period Weeks   Status Achieved     PT SHORT TERM GOAL #2   Title Patient to experience pain as being no more than 2/10 in order to improve QOL and functional task tolerance    Baseline 7/23- 7/10 today but states this is due to weather; normally a 5/10, 3-4/10 at best    Time 3   Period Weeks   Status On-going     PT SHORT TERM GOAL #3   Title Paitent to be participatory in regular walking program, at least 15-20 minutes consecutively, at least 1x/day and 7 days per week, in order to improve LE strength and functional activity tolerance    Baseline 7/23- has done here and there but not consistently    Time 3   Period Weeks   Status On-going     PT SHORT TERM GOAL #4   Title Patient to be compliant in correctly performing HEP, to be updated PRN    Baseline 7/23- fluctuating compliance, states "sometimes I don't feel like it"    Time 1   Period Weeks   Status On-going           PT Long Term Goals - 09/15/16 1321      PT LONG TERM GOAL #1   Title Patient to show MMT as having improved by at least 1 grade in all tested groups in order to reduce pain and improve balance    Baseline 7/23- LE strength has improved but hips remain weak    Time 6   Period Weeks   Status Partially Met     PT LONG TERM GOAL #2   Title Patient to score at least 20 on DGI in order to show improved balance/reduced fall risk    Baseline 7/23- 19  Time 6   Period Weeks   Status On-going     PT LONG TERM GOAL #3   Title Patient to be participatory in regular exercise program, at least 20 minutes in duration and 4  days per week, in order to maintain functional gains and prevent recurrence of symptoms    Baseline 10/06/2022- ongoing    Time 6   Period Weeks   Status On-going     PT LONG TERM GOAL #4   Title Patient to report she has been able to go shopping for at least 2 hours without increase in pain or unsteadiness in order to improve QOL    Baseline 10/06/2022- not able to now    Time 6   Period Weeks   Status On-going               Plan - October 05, 2016 1400    Clinical Impression Statement Re-assessment performed today. Patient continues to report she has not been walking and is not regularly compliant with daily performance of her HEP, often because "I don't feel like it". Extensive education provided today regarding importance of regular exercise and HEP compliance in relation to her progress with PT and general health. Overall patient does appear to be making some slow progress with skilled PT services, and will benefit from an extension of skilled services to address functional strength and balance primarily moving forward.    Rehab Potential Good   Clinical Impairments Affecting Rehab Potential (+) motivated to participate, success with PT in the past; (-) sedentary lifestyle, recurrent condition    PT Frequency 2x / week   PT Duration 3 weeks   PT Treatment/Interventions ADLs/Self Care Home Management;Biofeedback;DME Instruction;Gait training;Stair training;Functional mobility training;Therapeutic activities;Therapeutic exercise;Balance training;Neuromuscular re-education;Patient/family education;Manual techniques;Energy conservation   PT Next Visit Plan focus on strength nad balance; strong enoucragement of independent activity    PT Home Exercise Plan Eval: bridges with UE pressdown, sidelying clams with red TB, sit to stand no UEs, tandem stance; 7/16 SKTC, DKTC, moist heat    Consulted and Agree with Plan of Care Patient      Patient will benefit from skilled therapeutic intervention in order  to improve the following deficits and impairments:  Improper body mechanics, Pain, Decreased coordination, Postural dysfunction, Decreased activity tolerance, Decreased strength, Decreased balance, Difficulty walking  Visit Diagnosis: Chronic midline low back pain without sciatica  Muscle weakness (generalized)  Other symptoms and signs involving the musculoskeletal system  Unsteadiness on feet       G-Codes - 2016/10/05 1401    Functional Assessment Tool Used (Outpatient Only) Based on skilled clinical assessment of strength, balance, pain patterns, fall risk    Functional Limitation Mobility: Walking and moving around   Mobility: Walking and Moving Around Current Status ([id number]) At least 40 percent but less than 60 percent impaired, limited or restricted   Mobility: Walking and Moving Around Goal Status [phone]) At least 20 percent but less than 40 percent impaired, limited or restricted      Problem List There are no active problems to display for this patient.   Deniece Ree PT, DPT [phone]  [place] [address] [place], [place], [address] Phone: [phone]   Fax:  [phone]  Name: Annaliyah Willig MRN: [id number] Date of Birth: [birth year]

## 2016-09-18 ENCOUNTER — Ambulatory Visit (HOSPITAL_COMMUNITY): Payer: Medicare HMO | Admitting: Physical Therapy

## 2016-09-18 DIAGNOSIS — M6281 Muscle weakness (generalized): Secondary | ICD-10-CM

## 2016-09-18 DIAGNOSIS — G8929 Other chronic pain: Secondary | ICD-10-CM

## 2016-09-18 DIAGNOSIS — M545 Low back pain, unspecified: Secondary | ICD-10-CM

## 2016-09-18 DIAGNOSIS — R2681 Unsteadiness on feet: Secondary | ICD-10-CM

## 2016-09-18 DIAGNOSIS — R29898 Other symptoms and signs involving the musculoskeletal system: Secondary | ICD-10-CM

## 2016-09-18 NOTE — Patient Instructions (Signed)
   FROG Hip adductor and groin stretch supine  Lying supine with the knees bent, feet together, let the knees fall out to the side until a stretch is felt through the groin.   Hold for at least 30-60 seconds.  Repeat 2 times, 1-2 times per day.

## 2016-09-18 NOTE — Therapy (Signed)
[place], [place], [address] Phone: [phone]   Fax:  [phone]  Physical Therapy Treatment  Patient Details  Name: Cheryl [name] MRN: [id number] Date of Birth: [birth year] Referring Provider: Suella Broad   Encounter Date: 09/18/2016      PT End of Session - 09/18/16 1425    Visit Number 8   Number of Visits 13   Date for PT Re-Evaluation 10/02/16   Authorization Type [employer] HMO (G-codes done 7th session)   Authorization Time Period 08/25/16 to 10/06/16   Authorization - Visit Number 8   Authorization - Number of Visits 17   PT Start Time 6754   PT Stop Time 1343   PT Time Calculation (min) 38 min   Activity Tolerance Patient tolerated treatment well   Behavior During Therapy [facility] for tasks assessed/performed      Past Medical History:  Diagnosis Date  . AAA (abdominal aortic aneurysm)   . Arthritis    osteoarthritis  . Hyperlipidemia   . Hypertension   . Hypoaldosteronism   . Renal disorder    renal insufficiency  . Sarcoidosis   . Thyroid disease    hypothyroidism    Past Surgical History:  Procedure Laterality Date  . ABDOMINAL AORTIC ANEURYSM REPAIR    . BREAST BIOPSY    . RENAL BIOPSY    . renal vein sampling      There were no vitals filed for this visit.      Subjective Assessment - 09/18/16 1308    Subjective Patient arrives stating she is not feeling good, her back is bothering her and she continues to blame her HEP even though this has not in the past caused many issues with increasing pain and has in the past improved her symptoms.  Her back is more sore than painful. She rates her pain as a 6/10 but expresses no signs of severe discomfort.    Pertinent History AAA with history of repair, sarcoidosis, hypothyroidism    Patient Stated Goals get rid of pain, improve balance    Currently in Pain? Yes   Pain Score 6    Pain Location Back                          [facility] Adult PT Treatment/Exercise - 09/18/16 0001      Lumbar Exercises: Standing   Heel Raises 15 reps   Heel Raises Limitations heel and toe    Forward Lunge 15 reps   Forward Lunge Limitations 6 inch box   cues for form    Other Standing Lumbar Exercises forward step ups 6 inch box 1x10 B    Other Standing Lumbar Exercises hip hikes 1x10 B      Lumbar Exercises: Supine   Bridge 15 reps   Bridge Limitations 2 sets; staggered stance      Lumbar Exercises: Sidelying   Hip Abduction 10 reps   Hip Abduction Limitations cues for form      Lumbar Exercises: Prone   Straight Leg Raise 10 reps   Straight Leg Raises Limitations knee bent              Balance Exercises - 09/18/16 1332      Balance Exercises: Standing   Tandem Stance Eyes open;Foam/compliant surface;3 reps;10 secs   SLS Eyes open;Solid surface;3 reps;10 secs   Tandem Gait Forward;Retro;2 reps  in side parallel bars  PT Education - 09/18/16 1425    Education provided Yes   Education Details conitinued to encourage HEP compliacne and regular activity on her own    Person(s) Educated Patient   Methods Explanation   Comprehension Verbalized understanding          PT Short Term Goals - 09/15/16 1318      PT SHORT TERM GOAL #1   Title Patient to be able to move through full lumbar ROM without pain in order to show improved muscle strength/core stability and QOL    Baseline 7/23- full ROM, muscles stretching but no pain    Time 3   Period Weeks   Status Achieved     PT SHORT TERM GOAL #2   Title Patient to experience pain as being no more than 2/10 in order to improve QOL and functional task tolerance    Baseline 7/23- 7/10 today but states this is due to weather; normally a 5/10, 3-4/10 at best    Time 3   Period Weeks   Status On-going     PT SHORT TERM GOAL #3   Title Paitent to be participatory in regular walking program, at least 15-20  minutes consecutively, at least 1x/day and 7 days per week, in order to improve LE strength and functional activity tolerance    Baseline 7/23- has done here and there but not consistently    Time 3   Period Weeks   Status On-going     PT SHORT TERM GOAL #4   Title Patient to be compliant in correctly performing HEP, to be updated PRN    Baseline 7/23- fluctuating compliance, states "sometimes I don't feel like it"    Time 1   Period Weeks   Status On-going           PT Long Term Goals - 09/15/16 1321      PT LONG TERM GOAL #1   Title Patient to show MMT as having improved by at least 1 grade in all tested groups in order to reduce pain and improve balance    Baseline 7/23- LE strength has improved but hips remain weak    Time 6   Period Weeks   Status Partially Met     PT LONG TERM GOAL #2   Title Patient to score at least 20 on DGI in order to show improved balance/reduced fall risk    Baseline 7/23- 19   Time 6   Period Weeks   Status On-going     PT LONG TERM GOAL #3   Title Patient to be participatory in regular exercise program, at least 20 minutes in duration and 4 days per week, in order to maintain functional gains and prevent recurrence of symptoms    Baseline 7/23- ongoing    Time 6   Period Weeks   Status On-going     PT LONG TERM GOAL #4   Title Patient to report she has been able to go shopping for at least 2 hours without increase in pain or unsteadiness in order to improve QOL    Baseline 7/23- not able to now    Time 6   Period Weeks   Status On-going               Plan - 09/18/16 1426    Clinical Impression Statement Patient arrives today stating she has been working on doing her HEP more, she plans to get on TM this weekend. Continued with functional strengthening and  balance training today, progressing all tasks and activities as able. Continued to provide strong encouragement for HEP compliance and activity on her own as well. Added frog  stretch to HEP but refrained but significant additions otherwise due to fluctuating compliance with HEP at this time, plan to expand HEP as compliance improves/becomes more consistent.    Rehab Potential Good   Clinical Impairments Affecting Rehab Potential (+) motivated to participate, success with PT in the past; (-) sedentary lifestyle, recurrent condition    PT Frequency 2x / week   PT Duration 3 weeks   PT Treatment/Interventions ADLs/Self Care Home Management;Biofeedback;DME Instruction;Gait training;Stair training;Functional mobility training;Therapeutic activities;Therapeutic exercise;Balance training;Neuromuscular re-education;Patient/family education;Manual techniques;Energy conservation   PT Next Visit Plan introduce TM or elliptical; continue strength/balance focus    PT Home Exercise Plan Eval: bridges with UE pressdown, sidelying clams with red TB, sit to stand no UEs, tandem stance; 7/16 SKTC, DKTC, moist heat 7/26: frog stretch    Consulted and Agree with Plan of Care Patient      Patient will benefit from skilled therapeutic intervention in order to improve the following deficits and impairments:  Improper body mechanics, Pain, Decreased coordination, Postural dysfunction, Decreased activity tolerance, Decreased strength, Decreased balance, Difficulty walking  Visit Diagnosis: Chronic midline low back pain without sciatica  Muscle weakness (generalized)  Other symptoms and signs involving the musculoskeletal system  Unsteadiness on feet     Problem List There are no active problems to display for this patient.   Deniece Ree PT, DPT [phone]  [place] [address] [place], [place], [address] Phone: [phone]   Fax:  [phone]  Name: Cheryl [name] MRN: [id number] Date of Birth: December 05, 1950

## 2016-09-19 DIAGNOSIS — Z01411 Encounter for gynecological examination (general) (routine) with abnormal findings: Secondary | ICD-10-CM | POA: Diagnosis not present

## 2016-09-19 DIAGNOSIS — R300 Dysuria: Secondary | ICD-10-CM | POA: Diagnosis not present

## 2016-09-22 ENCOUNTER — Ambulatory Visit (HOSPITAL_COMMUNITY): Payer: Medicare HMO

## 2016-09-22 DIAGNOSIS — M545 Low back pain: Secondary | ICD-10-CM | POA: Diagnosis not present

## 2016-09-22 DIAGNOSIS — M6281 Muscle weakness (generalized): Secondary | ICD-10-CM

## 2016-09-22 DIAGNOSIS — R29898 Other symptoms and signs involving the musculoskeletal system: Secondary | ICD-10-CM

## 2016-09-22 DIAGNOSIS — R2681 Unsteadiness on feet: Secondary | ICD-10-CM

## 2016-09-22 DIAGNOSIS — G8929 Other chronic pain: Secondary | ICD-10-CM

## 2016-09-22 NOTE — Therapy (Signed)
[place], [place], [address] Phone: [phone]   Fax:  [phone]  Physical Therapy Treatment  Patient Details  Name: Cheryl [name] MRN: [id number] Date of Birth: Nov 30, 1950 Referring Provider: Suella Broad   Encounter Date: 09/22/2016      PT End of Session - 09/22/16 1310    Visit Number 9   Number of Visits 13   Date for PT Re-Evaluation 10/02/16   Authorization Type [employer] HMO (G-codes done 7th session)   Authorization Time Period 08/25/16 to 10/06/16   Authorization - Visit Number 9   Authorization - Number of Visits 17   PT Start Time 8786   PT Stop Time 1345   PT Time Calculation (min) 42 min   Activity Tolerance Patient tolerated treatment well   Behavior During Therapy [facility] for tasks assessed/performed      Past Medical History:  Diagnosis Date  . AAA (abdominal aortic aneurysm)   . Arthritis    osteoarthritis  . Hyperlipidemia   . Hypertension   . Hypoaldosteronism   . Renal disorder    renal insufficiency  . Sarcoidosis   . Thyroid disease    hypothyroidism    Past Surgical History:  Procedure Laterality Date  . ABDOMINAL AORTIC ANEURYSM REPAIR    . BREAST BIOPSY    . RENAL BIOPSY    . renal vein sampling      There were no vitals filed for this visit.      Subjective Assessment - 09/22/16 1306    Subjective Pain free at entrance, did take two excedrin prior session.  Pt reports ability to shop for multiple hours over weekend, reports she was tired and got cramps in c   Patient Stated Goals get rid of pain, improve balance    Currently in Pain? No/denies                         [facility] Adult PT Treatment/Exercise - 09/22/16 0001      Lumbar Exercises: Aerobic   Tread Mill Gait trainer 6' speed range from 1.5-->,1.8 --> 2.0 x 6 min (Cueing for posture, abdominal tightening and good mechanics noted)     Lumbar Exercises: Standing   Heel Raises 15  reps   Heel Raises Limitations heel and toe with minimal HHA   Forward Lunge 15 reps   Forward Lunge Limitations 6 inch box no HHA     Lumbar Exercises: Sidelying   Hip Abduction 10 reps   Hip Abduction Limitations cues for form      Lumbar Exercises: Prone   Single Arm Raise Right;Left;10 reps   Straight Leg Raise 10 reps   Straight Leg Raises Limitations knee straight   Opposite Arm/Leg Raise Right arm/Left leg;Left arm/Right leg;5 reps;3 seconds             Balance Exercises - 09/22/16 1336      Balance Exercises: Standing   Tandem Stance Eyes open;Foam/compliant surface;3 reps;20 secs   SLS Eyes open;3 reps  Lt 20", Rt 11" max of 3   SLS with Vectors Solid surface;Intermittent upper extremity assist;2 reps  2x 5" BLE intermittent HHA   Other Standing Exercises SLS hip hike with swing forward/backward 5x each           PT Education - 09/22/16 1334    Education provided Yes   Education Details Encouraged to begin walking program on TM or in community  Person(s) Educated Patient   Methods Explanation   Comprehension Verbalized understanding;Returned demonstration          PT Short Term Goals - 09/15/16 1318      PT SHORT TERM GOAL #1   Title Patient to be able to move through full lumbar ROM without pain in order to show improved muscle strength/core stability and QOL    Baseline 7/23- full ROM, muscles stretching but no pain    Time 3   Period Weeks   Status Achieved     PT SHORT TERM GOAL #2   Title Patient to experience pain as being no more than 2/10 in order to improve QOL and functional task tolerance    Baseline 7/23- 7/10 today but states this is due to weather; normally a 5/10, 3-4/10 at best    Time 3   Period Weeks   Status On-going     PT SHORT TERM GOAL #3   Title Paitent to be participatory in regular walking program, at least 15-20 minutes consecutively, at least 1x/day and 7 days per week, in order to improve LE strength and  functional activity tolerance    Baseline 7/23- has done here and there but not consistently    Time 3   Period Weeks   Status On-going     PT SHORT TERM GOAL #4   Title Patient to be compliant in correctly performing HEP, to be updated PRN    Baseline 7/23- fluctuating compliance, states "sometimes I don't feel like it"    Time 1   Period Weeks   Status On-going           PT Long Term Goals - 09/15/16 1321      PT LONG TERM GOAL #1   Title Patient to show MMT as having improved by at least 1 grade in all tested groups in order to reduce pain and improve balance    Baseline 7/23- LE strength has improved but hips remain weak    Time 6   Period Weeks   Status Partially Met     PT LONG TERM GOAL #2   Title Patient to score at least 20 on DGI in order to show improved balance/reduced fall risk    Baseline 7/23- 19   Time 6   Period Weeks   Status On-going     PT LONG TERM GOAL #3   Title Patient to be participatory in regular exercise program, at least 20 minutes in duration and 4 days per week, in order to maintain functional gains and prevent recurrence of symptoms    Baseline 7/23- ongoing    Time 6   Period Weeks   Status On-going     PT LONG TERM GOAL #4   Title Patient to report she has been able to go shopping for at least 2 hours without increase in pain or unsteadiness in order to improve QOL    Baseline 7/23- not able to now    Time 6   Period Weeks   Status On-going               Plan - 09/22/16 1324    Clinical Impression Statement Continued session focus with functional strengthening and balance training.  Added TM with cueing for posture and core activation, pt presents with good mechanics and safe.  Encouraged pt to begin a walking program on TM or in community.  Progressed gluteal/postural strengthening with prone UE/Le lifts and continued with CKC activaities for functional  strengtheing.  Pt able to demonstrate appropriate form and mechanics  with minimal cueing required.     Clinical Impairments Affecting Rehab Potential (+) motivated to participate, success with PT in the past; (-) sedentary lifestyle, recurrent condition    PT Frequency 2x / week   PT Duration 3 weeks   PT Treatment/Interventions ADLs/Self Care Home Management;Biofeedback;DME Instruction;Gait training;Stair training;Functional mobility training;Therapeutic activities;Therapeutic exercise;Balance training;Neuromuscular re-education;Patient/family education;Manual techniques;Energy conservation   PT Next Visit Plan Progress CKC for functional strengthening and balance training.     PT Home Exercise Plan Eval: bridges with UE pressdown, sidelying clams with red TB, sit to stand no UEs, tandem stance; 7/16 SKTC, DKTC, moist heat 7/26: frog stretch       Patient will benefit from skilled therapeutic intervention in order to improve the following deficits and impairments:  Improper body mechanics, Pain, Decreased coordination, Postural dysfunction, Decreased activity tolerance, Decreased strength, Decreased balance, Difficulty walking  Visit Diagnosis: Chronic midline low back pain without sciatica  Muscle weakness (generalized)  Other symptoms and signs involving the musculoskeletal system  Unsteadiness on feet     Problem List There are no active problems to display for this patient.  [address] LPTA; [place]  Aldona Lento 09/22/2016, 1:48 PM  [place] [address] [place], [place], [address] Phone: [phone]   Fax:  [phone]  Name: Keeana Pieratt MRN: [id number] Date of Birth: 10-28-1950

## 2016-09-25 ENCOUNTER — Ambulatory Visit (HOSPITAL_COMMUNITY): Payer: Medicare HMO | Attending: Physical Medicine and Rehabilitation | Admitting: Physical Therapy

## 2016-09-25 DIAGNOSIS — R2681 Unsteadiness on feet: Secondary | ICD-10-CM | POA: Diagnosis present

## 2016-09-25 DIAGNOSIS — G8929 Other chronic pain: Secondary | ICD-10-CM | POA: Diagnosis present

## 2016-09-25 DIAGNOSIS — M6281 Muscle weakness (generalized): Secondary | ICD-10-CM | POA: Diagnosis present

## 2016-09-25 DIAGNOSIS — M545 Low back pain: Secondary | ICD-10-CM | POA: Insufficient documentation

## 2016-09-25 DIAGNOSIS — R29898 Other symptoms and signs involving the musculoskeletal system: Secondary | ICD-10-CM | POA: Diagnosis present

## 2016-09-25 NOTE — Therapy (Signed)
[place], [place], [address] Phone: [phone]   Fax:  [phone]  Physical Therapy Treatment  Patient Details  Name: Cheryl [name] MRN: [id number] Date of Birth: Jul 15, 1950 Referring Provider: Suella Broad   Encounter Date: 09/25/2016      PT End of Session - 09/25/16 1346    Visit Number 10   Number of Visits 13   Date for PT Re-Evaluation 10/02/16   Authorization Type [employer] Medicare HMO (G-codes done 7th session)   Authorization Time Period 08/25/16 to 10/06/16   Authorization - Visit Number 10   Authorization - Number of Visits 17   PT Start Time 8264   PT Stop Time 1348   PT Time Calculation (min) 46 min   Activity Tolerance Patient tolerated treatment well   Behavior During Therapy [facility] for tasks assessed/performed      Past Medical History:  Diagnosis Date  . AAA (abdominal aortic aneurysm)   . Arthritis    osteoarthritis  . Hyperlipidemia   . Hypertension   . Hypoaldosteronism   . Renal disorder    renal insufficiency  . Sarcoidosis   . Thyroid disease    hypothyroidism    Past Surgical History:  Procedure Laterality Date  . ABDOMINAL AORTIC ANEURYSM REPAIR    . BREAST BIOPSY    . RENAL BIOPSY    . renal vein sampling      There were no vitals filed for this visit.      Subjective Assessment - 09/25/16 1306    Subjective Pt states her back is irritated this session and believes it it due to the weather (rainy).  Currently 5/10.   No known activities that may have flaired her up.   Currently in Pain? Yes   Pain Score 5    Pain Location Back   Pain Orientation Right;Left;Mid;Lower   Pain Descriptors / Indicators Aching   Pain Type Chronic pain                         [facility] Adult PT Treatment/Exercise - 09/25/16 0001      Lumbar Exercises: Aerobic   Tread Mill Gait trainer 6' speed 1.6-->2.58mh x 6 min (Cueing for posture, abdominal tightening and good  mechanics noted)     Lumbar Exercises: Standing   Heel Raises 15 reps   Heel Raises Limitations heel and toe with minimal HHA   Forward Lunge 15 reps   Forward Lunge Limitations 4 inch box no HHA   Other Standing Lumbar Exercises forward step ups 6 inch box 1x10 B    Other Standing Lumbar Exercises hip hikes 1x10 B      Lumbar Exercises: Supine   Bridge 15 reps   Bridge Limitations 2 sets; staggered stance      Lumbar Exercises: Sidelying   Hip Abduction 10 reps     Lumbar Exercises: Prone   Single Arm Raise Right;Left;10 reps   Straight Leg Raise 10 reps   Straight Leg Raises Limitations knee straight   Opposite Arm/Leg Raise Right arm/Left leg;Left arm/Right leg;5 reps;3 seconds   Other Prone Lumbar Exercises POE 2 minutes             Balance Exercises - 09/25/16 1345      Balance Exercises: Standing   SLS with Vectors Solid surface;Intermittent upper extremity assist;5 reps  3" holds             PT Short Term  Goals - 09/15/16 1318      PT SHORT TERM GOAL #1   Title Patient to be able to move through full lumbar ROM without pain in order to show improved muscle strength/core stability and QOL    Baseline 7/23- full ROM, muscles stretching but no pain    Time 3   Period Weeks   Status Achieved     PT SHORT TERM GOAL #2   Title Patient to experience pain as being no more than 2/10 in order to improve QOL and functional task tolerance    Baseline 7/23- 7/10 today but states this is due to weather; normally a 5/10, 3-4/10 at best    Time 3   Period Weeks   Status On-going     PT SHORT TERM GOAL #3   Title Paitent to be participatory in regular walking program, at least 15-20 minutes consecutively, at least 1x/day and 7 days per week, in order to improve LE strength and functional activity tolerance    Baseline 7/23- has done here and there but not consistently    Time 3   Period Weeks   Status On-going     PT SHORT TERM GOAL #4   Title Patient to be  compliant in correctly performing HEP, to be updated PRN    Baseline 7/23- fluctuating compliance, states "sometimes I don't feel like it"    Time 1   Period Weeks   Status On-going           PT Long Term Goals - 09/15/16 1321      PT LONG TERM GOAL #1   Title Patient to show MMT as having improved by at least 1 grade in all tested groups in order to reduce pain and improve balance    Baseline 7/23- LE strength has improved but hips remain weak    Time 6   Period Weeks   Status Partially Met     PT LONG TERM GOAL #2   Title Patient to score at least 20 on DGI in order to show improved balance/reduced fall risk    Baseline 7/23- 19   Time 6   Period Weeks   Status On-going     PT LONG TERM GOAL #3   Title Patient to be participatory in regular exercise program, at least 20 minutes in duration and 4 days per week, in order to maintain functional gains and prevent recurrence of symptoms    Baseline 7/23- ongoing    Time 6   Period Weeks   Status On-going     PT LONG TERM GOAL #4   Title Patient to report she has been able to go shopping for at least 2 hours without increase in pain or unsteadiness in order to improve QOL    Baseline 7/23- not able to now    Time 6   Period Weeks   Status On-going               Plan - 09/25/16 1421    Clinical Impression Statement Pt with increased pain this session so did not add or increase repetitions.  Pt did, however report a reduction in pain at EOS by 2 levels.  Improved form with all therex requiring minimal cues.  Hip hikes are most challenging and physically difficult for patient to complete.  Pt continues to make steady progress.   Clinical Impairments Affecting Rehab Potential (+) motivated to participate, success with PT in the past; (-) sedentary lifestyle, recurrent condition  PT Frequency 2x / week   PT Duration 3 weeks   PT Treatment/Interventions ADLs/Self Care Home Management;Biofeedback;DME Instruction;Gait  training;Stair training;Functional mobility training;Therapeutic activities;Therapeutic exercise;Balance training;Neuromuscular re-education;Patient/family education;Manual techniques;Energy conservation   PT Next Visit Plan Progress CKC for functional strengthening and balance training.  Begin dynamic balance tasks, i.e. tandem, retro, sidestepping.   PT Home Exercise Plan Eval: bridges with UE pressdown, sidelying clams with red TB, sit to stand no UEs, tandem stance; 7/16 SKTC, DKTC, moist heat 7/26: frog stretch       Patient will benefit from skilled therapeutic intervention in order to improve the following deficits and impairments:  Improper body mechanics, Pain, Decreased coordination, Postural dysfunction, Decreased activity tolerance, Decreased strength, Decreased balance, Difficulty walking  Visit Diagnosis: Chronic midline low back pain without sciatica  Muscle weakness (generalized)  Other symptoms and signs involving the musculoskeletal system  Unsteadiness on feet     Problem List There are no active problems to display for this patient.  Teena Irani, PTA/CLT [phone]  Teena Irani 09/25/2016, 2:26 PM  [place], [place], [address] Phone: [phone]   Fax:  [phone]  Name: Yukiko Minnich MRN: [id number] Date of Birth: 06/09/1950

## 2016-09-29 ENCOUNTER — Ambulatory Visit (HOSPITAL_COMMUNITY): Payer: Medicare HMO

## 2016-09-29 DIAGNOSIS — G8929 Other chronic pain: Secondary | ICD-10-CM

## 2016-09-29 DIAGNOSIS — M545 Low back pain, unspecified: Secondary | ICD-10-CM

## 2016-09-29 DIAGNOSIS — M6281 Muscle weakness (generalized): Secondary | ICD-10-CM

## 2016-09-29 DIAGNOSIS — R29898 Other symptoms and signs involving the musculoskeletal system: Secondary | ICD-10-CM

## 2016-09-29 DIAGNOSIS — R2681 Unsteadiness on feet: Secondary | ICD-10-CM

## 2016-09-29 NOTE — Therapy (Signed)
[place], [place], [address] Phone: [phone]   Fax:  [phone]  Physical Therapy Treatment  Patient Details  Name: Cheryl [name] MRN: [id number] Date of Birth: 05-11-1950 Referring Provider: Suella Broad   Encounter Date: 09/29/2016      PT End of Session - 09/29/16 1304    Visit Number 11   Number of Visits 13   Date for PT Re-Evaluation 10/02/16   Authorization Type [employer] HMO (G-codes done 7th session)   Authorization Time Period 08/25/16 to 10/06/16   Authorization - Visit Number 11   Authorization - Number of Visits 17   PT Start Time 1301   PT Stop Time 6962   PT Time Calculation (min) 42 min   Equipment Utilized During Treatment Gait belt   Activity Tolerance Patient tolerated treatment well;No increased pain;Patient limited by fatigue   Behavior During Therapy [facility] for tasks assessed/performed      Past Medical History:  Diagnosis Date  . AAA (abdominal aortic aneurysm)   . Arthritis    osteoarthritis  . Hyperlipidemia   . Hypertension   . Hypoaldosteronism   . Renal disorder    renal insufficiency  . Sarcoidosis   . Thyroid disease    hypothyroidism    Past Surgical History:  Procedure Laterality Date  . ABDOMINAL AORTIC ANEURYSM REPAIR    . BREAST BIOPSY    . RENAL BIOPSY    . renal vein sampling      There were no vitals filed for this visit.      Subjective Assessment - 09/29/16 1258    Subjective Pt stated she is feeling good today, no reports of pain today.  Has began walking on treadmile at home for 20 minutes at time, every other day.     Pertinent History AAA with history of repair, sarcoidosis, hypothyroidism    Patient Stated Goals get rid of pain, improve balance    Currently in Pain? No/denies                         [facility] Adult PT Treatment/Exercise - 09/29/16 0001      Lumbar Exercises: Standing   Heel Raises 15 reps   Heel Raises  Limitations heel and toe with minimal HHA   Functional Squats 10 reps   Functional Squats Limitations ball at 12in step   Forward Lunge 15 reps   Forward Lunge Limitations 4 inch box with UE flexion RTB             Balance Exercises - 09/29/16 1328      Balance Exercises: Standing   SLS Eyes open;3 reps  Lt 60"+, Rt 51"   SLS with Vectors Solid surface;Intermittent upper extremity assist;5 reps;3 reps  BLE 3x 5'   Balance Beam tandem, retro, sidestep 2RT   Sidestepping 1 rep;Theraband  RTB   Other Standing Exercises SLS hip hike with swing forward/backward 5x each             PT Short Term Goals - 09/15/16 1318      PT SHORT TERM GOAL #1   Title Patient to be able to move through full lumbar ROM without pain in order to show improved muscle strength/core stability and QOL    Baseline 7/23- full ROM, muscles stretching but no pain    Time 3   Period Weeks   Status Achieved     PT SHORT TERM GOAL #2  Title Patient to experience pain as being no more than 2/10 in order to improve QOL and functional task tolerance    Baseline 7/23- 7/10 today but states this is due to weather; normally a 5/10, 3-4/10 at best    Time 3   Period Weeks   Status On-going     PT SHORT TERM GOAL #3   Title Paitent to be participatory in regular walking program, at least 15-20 minutes consecutively, at least 1x/day and 7 days per week, in order to improve LE strength and functional activity tolerance    Baseline 7/23- has done here and there but not consistently    Time 3   Period Weeks   Status On-going     PT SHORT TERM GOAL #4   Title Patient to be compliant in correctly performing HEP, to be updated PRN    Baseline 7/23- fluctuating compliance, states "sometimes I don't feel like it"    Time 1   Period Weeks   Status On-going           PT Long Term Goals - 09/15/16 1321      PT LONG TERM GOAL #1   Title Patient to show MMT as having improved by at least 1 grade in all  tested groups in order to reduce pain and improve balance    Baseline 7/23- LE strength has improved but hips remain weak    Time 6   Period Weeks   Status Partially Met     PT LONG TERM GOAL #2   Title Patient to score at least 20 on DGI in order to show improved balance/reduced fall risk    Baseline 7/23- 19   Time 6   Period Weeks   Status On-going     PT LONG TERM GOAL #3   Title Patient to be participatory in regular exercise program, at least 20 minutes in duration and 4 days per week, in order to maintain functional gains and prevent recurrence of symptoms    Baseline 7/23- ongoing    Time 6   Period Weeks   Status On-going     PT LONG TERM GOAL #4   Title Patient to report she has been able to go shopping for at least 2 hours without increase in pain or unsteadiness in order to improve QOL    Baseline 7/23- not able to now    Time 6   Period Weeks   Status On-going               Plan - 09/29/16 1344    Clinical Impression Statement Pt progressing well with reports of increased adherece to HEP, has began gait on TM at home and reports of pain free at entrance today.  Session focus on functional strengthening and addition of dynamic surface balance training for stabilty.  Pt improving BLE SLS and improve mechanics wiht hip hikes this session.  Min A with balancebeam activities.  No reports of pain through session, was limited by fatigyue with task.     Rehab Potential Good   Clinical Impairments Affecting Rehab Potential (+) motivated to participate, success with PT in the past; (-) sedentary lifestyle, recurrent condition    PT Frequency 2x / week   PT Duration 3 weeks   PT Treatment/Interventions ADLs/Self Care Home Management;Biofeedback;DME Instruction;Gait training;Stair training;Functional mobility training;Therapeutic activities;Therapeutic exercise;Balance training;Neuromuscular re-education;Patient/family education;Manual techniques;Energy conservation   PT  Next Visit Plan Reassess next session.     PT Home Exercise Plan Eval:  bridges with UE pressdown, sidelying clams with red TB, sit to stand no UEs, tandem stance; 7/16 SKTC, DKTC, moist heat 7/26: frog stretch       Patient will benefit from skilled therapeutic intervention in order to improve the following deficits and impairments:  Improper body mechanics, Pain, Decreased coordination, Postural dysfunction, Decreased activity tolerance, Decreased strength, Decreased balance, Difficulty walking  Visit Diagnosis: Chronic midline low back pain without sciatica  Muscle weakness (generalized)  Other symptoms and signs involving the musculoskeletal system  Unsteadiness on feet     Problem List There are no active problems to display for this patient.  [address], LPTA; [place]  Aldona Lento 09/29/2016, 1:51 PM  [place], [place], [address] Phone: [phone]   Fax:  [phone]  Name: Malu Pellegrini MRN: [id number] Date of Birth: April 03, 1950

## 2016-10-02 ENCOUNTER — Ambulatory Visit (HOSPITAL_COMMUNITY): Payer: Medicare HMO | Admitting: Physical Therapy

## 2016-10-02 DIAGNOSIS — M545 Low back pain, unspecified: Secondary | ICD-10-CM

## 2016-10-02 DIAGNOSIS — R2681 Unsteadiness on feet: Secondary | ICD-10-CM

## 2016-10-02 DIAGNOSIS — M6281 Muscle weakness (generalized): Secondary | ICD-10-CM

## 2016-10-02 DIAGNOSIS — R29898 Other symptoms and signs involving the musculoskeletal system: Secondary | ICD-10-CM

## 2016-10-02 DIAGNOSIS — G8929 Other chronic pain: Secondary | ICD-10-CM

## 2016-10-02 NOTE — Therapy (Signed)
[place] [address] [place], [place], [address] Phone: [phone]   Fax:  [phone]  Physical Therapy Treatment (Re-Assessment)  Patient Details  Name: Cheryl [name] MRN: [id number] Date of Birth: October 25, 1950 Referring Provider: Suella Broad   Encounter Date: 10/02/2016      PT End of Session - 10/02/16 1344    Visit Number 12   Number of Visits 19   Date for PT Re-Evaluation 10/23/16   Authorization Type [employer] HMO (G-codes done 2022/06/05 session)   Authorization Time Period 09/28/44 to 2/70/35; recert done 8/9   Authorization - Visit Number 12   Authorization - Number of Visits 22   PT Start Time 1304   PT Stop Time 1344   PT Time Calculation (min) 40 min   Equipment Utilized During Treatment Gait belt   Activity Tolerance Patient tolerated treatment well   Behavior During Therapy [facility] for tasks assessed/performed      Past Medical History:  Diagnosis Date  . AAA (abdominal aortic aneurysm)   . Arthritis    osteoarthritis  . Hyperlipidemia   . Hypertension   . Hypoaldosteronism   . Renal disorder    renal insufficiency  . Sarcoidosis   . Thyroid disease    hypothyroidism    Past Surgical History:  Procedure Laterality Date  . ABDOMINAL AORTIC ANEURYSM REPAIR    . BREAST BIOPSY    . RENAL BIOPSY    . renal vein sampling      There were no vitals filed for this visit.      Subjective Assessment - 10/02/16 1306    Subjective Patient arrives today stating that she feels OK, her back continues to hurt on and off. She continues to walk on her TM every other day. Balance is the hardest thing for her to do right now, she states she also feels like her endurance is still not the best. She is better.    Pertinent History AAA with history of repair, sarcoidosis, hypothyroidism    How long can you sit comfortably? 8/9- unlimited    How long can you stand comfortably? 8/9- at least 30 minutes    How long can you  walk comfortably? 8/9- doing 20 minutse on TM   Patient Stated Goals get rid of pain, improve balance    Currently in Pain? Yes   Pain Score 4    Pain Location Back   Pain Orientation Lower;Medial   Pain Descriptors / Indicators Aching   Pain Type Chronic pain   Pain Radiating Towards none    Pain Onset More than a month ago   Pain Frequency Constant   Aggravating Factors  bad weather    Pain Relieving Factors heat pad    Effect of Pain on Daily Activities moderate impact             [facility] PT Assessment - 10/02/16 0001      Assessment   Medical Diagnosis low back pain    Referring Provider Suella Broad    Onset Date/Surgical Date --  1 year ago    Next MD Visit Dr. Nelva Bush after PT    Prior Therapy PT a couple of years ago      Precautions   Precautions Fall     Restrictions   Weight Bearing Restrictions No     Balance Screen   Has the patient fallen in the past 6 months Yes   How many times? 1   Has the  patient had a decrease in activity level because of a fear of falling?  No   Is the patient reluctant to leave their home because of a fear of falling?  No     Prior Function   Level of Independence Independent;Independent with basic ADLs;Independent with gait;Independent with transfers   Vocation Retired     AROM   Lumbar Flexion full range no pain    Lumbar Extension full range no pain    Lumbar - Right Side Bend WFL but pulling    Lumbar - Left Side Bend WFL but pulling      Strength   Right Hip Flexion 3+/5   Right Hip Extension 3-/5   Right Hip ABduction 2+/5   Left Hip Flexion 3/5   Left Hip Extension 3-/5   Left Hip ABduction 3+/5   Right Knee Flexion 4+/5   Right Knee Extension 5/5   Left Knee Flexion 5/5   Left Knee Extension 5/5   Right Ankle Dorsiflexion 5/5   Left Ankle Dorsiflexion 5/5     Dynamic Gait Index   Level Surface Normal   Change in Gait Speed Normal   Gait with Horizontal Head Turns Mild Impairment   Gait with Vertical Head  Turns Normal   Gait and Pivot Turn Normal   Step Over Obstacle Normal   Step Around Obstacles Normal   Steps Mild Impairment   Total Score 22                     [facility] Adult PT Treatment/Exercise - 10/02/16 0001      Lumbar Exercises: Standing   Other Standing Lumbar Exercises standing hip ABD 1x10 B 2 second holds      Lumbar Exercises: Supine   Bridge 15 reps;2 seconds   Bridge Limitations 2 second holds    Straight Leg Raise --   Straight Leg Raises Limitations --     Lumbar Exercises: Sidelying   Other Sidelying Lumbar Exercises sidelying hip flexion SLR 1x15 B                PT Education - 10/02/16 1342    Education provided Yes   Education Details progress with skilled PT services, HEP updates    Person(s) Educated Patient   Methods Explanation   Comprehension Verbalized understanding          PT Short Term Goals - 10/02/16 1323      PT SHORT TERM GOAL #1   Title Patient to be able to move through full lumbar ROM without pain in order to show improved muscle strength/core stability and QOL    Baseline 8/9- full ROM, muscles stretching    Time 3   Period Weeks   Status Achieved     PT SHORT TERM GOAL #2   Title Patient to experience pain as being no more than 2/10 in order to improve QOL and functional task tolerance    Baseline 8/9- 4/10 right now due to weather; at best 0/10 about 50% of the time    Time 3   Period Weeks   Status Partially Met     PT SHORT TERM GOAL #3   Title Paitent to be participatory in regular walking program, at least 15-20 minutes consecutively, at least 1x/day and 7 days per week, in order to improve LE strength and functional activity tolerance    Baseline 8/9- doing walking program every other day 20 minutes in duration    Time 3  Period Weeks   Status Partially Met     PT SHORT TERM GOAL #4   Title Patient to be compliant in correctly performing HEP, to be updated PRN    Baseline 16-Oct-2022- going OK,  balance exercises are still hard; consistently 4x/week    Time 1   Period Weeks   Status Partially Met           PT Long Term Goals - October 15, 2016 1326      PT LONG TERM GOAL #1   Title Patient to show MMT as having improved by at least 1 grade in all tested groups in order to reduce pain and improve balance    Baseline October 16, 2022- LE strength has improved but hips remain weak    Time 6   Period Weeks   Status Partially Met     PT LONG TERM GOAL #2   Title Patient to score at least 20 on DGI in order to show improved balance/reduced fall risk    Baseline 10-16-2022- 22   Time 6   Period Weeks   Status Achieved     PT LONG TERM GOAL #3   Title Patient to be participatory in regular exercise program, at least 20 minutes in duration and 4 days per week, in order to maintain functional gains and prevent recurrence of symptoms    Baseline 10/16/2022- ongoing    Time 6   Period Weeks   Status On-going               Plan - 2016/10/15 1345    Clinical Impression Statement Re-assessment performed today. Patient appears to be making improved progress towards her goals, and has greatly increased compliance with her HEP and walking program at home. She does continue to demonstrate ongoing muscle weakness especially proximally as well as impaired balance and poor functional activity tolerance. Recommend short continuation of skilled PT services to address functional deficits, further reduce fall risk, and optimize overall level of function.    Rehab Potential Good   Clinical Impairments Affecting Rehab Potential (+) motivated to participate, success with PT in the past; (-) sedentary lifestyle, recurrent condition    PT Frequency 2x / week   PT Duration 3 weeks   PT Treatment/Interventions ADLs/Self Care Home Management;Biofeedback;DME Instruction;Gait training;Stair training;Functional mobility training;Therapeutic activities;Therapeutic exercise;Balance training;Neuromuscular re-education;Patient/family  education;Manual techniques;Energy conservation   PT Next Visit Plan focus on proximal strength and balance; encourage ongoing TM and exercise program at home    PT Home Exercise Plan Eval: bridges with UE pressdown, sidelying clams with red TB, sit to stand no UEs, tandem stance; 7/16 SKTC, DKTC, moist heat 7/26: frog stretch 16-Oct-2022 bridges c holds, sdielying hip flexion, standing hip ABD holds    Consulted and Agree with Plan of Care Patient      Patient will benefit from skilled therapeutic intervention in order to improve the following deficits and impairments:  Improper body mechanics, Pain, Decreased coordination, Postural dysfunction, Decreased activity tolerance, Decreased strength, Decreased balance, Difficulty walking  Visit Diagnosis: Chronic midline low back pain without sciatica - Plan: PT plan of care cert/re-cert  Muscle weakness (generalized) - Plan: PT plan of care cert/re-cert  Other symptoms and signs involving the musculoskeletal system - Plan: PT plan of care cert/re-cert  Unsteadiness on feet - Plan: PT plan of care cert/re-cert       G-Codes - 10-15-16 1346    Functional Assessment Tool Used (Outpatient Only) Based on skilled clinical assessment of strength, balance, pain patterns, fall  risk    Functional Limitation Mobility: Walking and moving around   Mobility: Walking and Moving Around Current Status [phone]) At least 20 percent but less than 40 percent impaired, limited or restricted   Mobility: Walking and Moving Around Goal Status [phone]) At least 20 percent but less than 40 percent impaired, limited or restricted      Problem List There are no active problems to display for this patient.   Deniece Ree PT, DPT [phone]  [place] [address] [place], [place], [address] Phone: [phone]   Fax:  [phone]  Name: Cheryl [name] MRN: [id number] Date of Birth: 08/01/1950

## 2016-10-02 NOTE — Patient Instructions (Signed)
   BRIDGING  While lying on your back, tighten your lower abdominals, squeeze your buttocks and then raise your buttocks off the floor/bed as creating a "Bridge" with your body.   Hold for 2-3 seconds.  Repeat 10-15 times, twice a day.     STRAIGHT LEG RAISE - SLR (DO THIS EXERCISE LAYING ON YOUR SIDE)  While lying on your side, kick your top leg your as high as you can in front of you.  Keep the opposite knee bent under you for support with your balance.  Repeat 15 times each leg, twice a day.      HIP ABDUCTION - STANDING   While standing, raise your leg out to the side. Keep your knee straight and maintain your toes pointed forward the entire time.    Use your arms for support if needed for balance and safety.   Lead with your heel and do not lean to the side.  Hold for 2-3 seconds.  Repeat 10 times each leg, twice a day.

## 2016-10-06 ENCOUNTER — Ambulatory Visit (HOSPITAL_COMMUNITY): Payer: Medicare HMO | Admitting: Physical Therapy

## 2016-10-06 DIAGNOSIS — M545 Low back pain: Principal | ICD-10-CM

## 2016-10-06 DIAGNOSIS — R29898 Other symptoms and signs involving the musculoskeletal system: Secondary | ICD-10-CM

## 2016-10-06 DIAGNOSIS — M6281 Muscle weakness (generalized): Secondary | ICD-10-CM

## 2016-10-06 DIAGNOSIS — R2681 Unsteadiness on feet: Secondary | ICD-10-CM

## 2016-10-06 DIAGNOSIS — G8929 Other chronic pain: Secondary | ICD-10-CM

## 2016-10-06 NOTE — Therapy (Signed)
[place], [place], [address] Phone: [phone]   Fax:  [phone]  Physical Therapy Treatment  Patient Details  Name: Cheryl [name] MRN: [id number] Date of Birth: December 08, 1950 Referring Provider: Suella Broad   Encounter Date: 10/06/2016      PT End of Session - 10/06/16 1342    Visit Number 13   Number of Visits 19   Date for PT Re-Evaluation 10/23/16   Authorization Type [employer] HMO (G-codes done 05-23-22 session)   Authorization Time Period 11/02/33 to 08/25/75; recert done 8/9   Authorization - Visit Number 13   Authorization - Number of Visits 22   PT Start Time 1304   PT Stop Time 1342   PT Time Calculation (min) 38 min   Activity Tolerance Patient tolerated treatment well   Behavior During Therapy [facility] for tasks assessed/performed      Past Medical History:  Diagnosis Date  . AAA (abdominal aortic aneurysm)   . Arthritis    osteoarthritis  . Hyperlipidemia   . Hypertension   . Hypoaldosteronism   . Renal disorder    renal insufficiency  . Sarcoidosis   . Thyroid disease    hypothyroidism    Past Surgical History:  Procedure Laterality Date  . ABDOMINAL AORTIC ANEURYSM REPAIR    . BREAST BIOPSY    . RENAL BIOPSY    . renal vein sampling      There were no vitals filed for this visit.      Subjective Assessment - 10/06/16 1307    Subjective patient arrives stating that "if I'm not wehre I want to be in 2 weeks I'm not doing the third", reports she feels a bit discouraged    Pertinent History AAA with history of repair, sarcoidosis, hypothyroidism    Patient Stated Goals get rid of pain, improve balance    Currently in Pain? Yes   Pain Score 6   no signs of distress corresponding with pain rating    Pain Location Back   Pain Orientation Lower   Pain Descriptors / Indicators Aching                         [facility] Adult PT Treatment/Exercise - 10/06/16 0001       Lumbar Exercises: Standing   Other Standing Lumbar Exercises 3 way hip holds 1x10 B   U HHA    Other Standing Lumbar Exercises hip hikes with and without swing 2x10 B      Lumbar Exercises: Supine   Bridge 15 reps;3 seconds   Bridge Limitations 4 second holds      Lumbar Exercises: Sidelying   Other Sidelying Lumbar Exercises sidelying hip flexion SLR 1x15 B  1#      Lumbar Exercises: Prone   Straight Leg Raise 15 reps   Straight Leg Raises Limitations knee straight             Balance Exercises - 10/06/16 1325      Balance Exercises: Standing   Tandem Stance Eyes open;Foam/compliant surface;3 reps;20 secs   Rockerboard Anterior/posterior;Lateral;EO;Intermittent UE support;Other (comment)  x2 min each direction in parallel bars            PT Education - 10/06/16 1342    Education provided Yes   Education Details training principles including time for building of muscle fibers as well as neurological recruitment of muscle tissue and general time frames for improvement   Person(s)  Educated Patient   Methods Explanation   Comprehension Verbalized understanding          PT Short Term Goals - 10/02/16 1323      PT SHORT TERM GOAL #1   Title Patient to be able to move through full lumbar ROM without pain in order to show improved muscle strength/core stability and QOL    Baseline 8/9- full ROM, muscles stretching    Time 3   Period Weeks   Status Achieved     PT SHORT TERM GOAL #2   Title Patient to experience pain as being no more than 2/10 in order to improve QOL and functional task tolerance    Baseline 8/9- 4/10 right now due to weather; at best 0/10 about 50% of the time    Time 3   Period Weeks   Status Partially Met     PT SHORT TERM GOAL #3   Title Paitent to be participatory in regular walking program, at least 15-20 minutes consecutively, at least 1x/day and 7 days per week, in order to improve LE strength and functional activity tolerance     Baseline 8/9- doing walking program every other day 20 minutes in duration    Time 3   Period Weeks   Status Partially Met     PT SHORT TERM GOAL #4   Title Patient to be compliant in correctly performing HEP, to be updated PRN    Baseline 8/9- going OK, balance exercises are still hard; consistently 4x/week    Time 1   Period Weeks   Status Partially Met           PT Long Term Goals - 10/02/16 1326      PT LONG TERM GOAL #1   Title Patient to show MMT as having improved by at least 1 grade in all tested groups in order to reduce pain and improve balance    Baseline 8/9- LE strength has improved but hips remain weak    Time 6   Period Weeks   Status Partially Met     PT LONG TERM GOAL #2   Title Patient to score at least 20 on DGI in order to show improved balance/reduced fall risk    Baseline 8/9- 22   Time 6   Period Weeks   Status Achieved     PT LONG TERM GOAL #3   Title Patient to be participatory in regular exercise program, at least 20 minutes in duration and 4 days per week, in order to maintain functional gains and prevent recurrence of symptoms    Baseline 8/9- ongoing    Time 6   Period Weeks   Status On-going               Plan - 10/06/16 1342    Clinical Impression Statement Patient arrives reporting she is feeling discouraged regarding her slow progress, "If I haven't done what I need in 2 more weeks I don't think it'll happen by 3 weeks". Discussed training principles including time for building of muscle fibers as well as neurological recruitment of muscle tissue and general time frames for improvement, also referenced how critically weak patient had been at evaluation compared to today to attempt to provide encouragement. Otherwise continued focus on proximal strength and balance today due to ongoing impairment in these areas.    Rehab Potential Good   Clinical Impairments Affecting Rehab Potential (+) motivated to participate, success with PT in  the past; (-) sedentary lifestyle,  recurrent condition    PT Frequency 2x / week   PT Duration 3 weeks   PT Treatment/Interventions ADLs/Self Care Home Management;Biofeedback;DME Instruction;Gait training;Stair training;Functional mobility training;Therapeutic activities;Therapeutic exercise;Balance training;Neuromuscular re-education;Patient/family education;Manual techniques;Energy conservation   PT Next Visit Plan continue strong focus on proximal strength and muscle endurance, balance; continue to encourage TM and HEP at home    PT Home Exercise Plan Eval: bridges with UE pressdown, sidelying clams with red TB, sit to stand no UEs, tandem stance; 7/16 SKTC, DKTC, moist heat 7/26: frog stretch 8/9 bridges c holds, sdielying hip flexion, standing hip ABD holds    Consulted and Agree with Plan of Care Patient      Patient will benefit from skilled therapeutic intervention in order to improve the following deficits and impairments:  Improper body mechanics, Pain, Decreased coordination, Postural dysfunction, Decreased activity tolerance, Decreased strength, Decreased balance, Difficulty walking  Visit Diagnosis: Chronic midline low back pain without sciatica  Muscle weakness (generalized)  Other symptoms and signs involving the musculoskeletal system  Unsteadiness on feet     Problem List There are no active problems to display for this patient.   Deniece Ree PT, DPT [phone]  [place] [address] [place], [place], [address] Phone: [phone]   Fax:  [phone]  Name: Cheryl [name] MRN: [id number] Date of Birth: 24-Jul-1950

## 2016-10-09 ENCOUNTER — Ambulatory Visit (HOSPITAL_COMMUNITY): Payer: Medicare HMO

## 2016-10-09 DIAGNOSIS — R2681 Unsteadiness on feet: Secondary | ICD-10-CM

## 2016-10-09 DIAGNOSIS — M545 Low back pain: Secondary | ICD-10-CM | POA: Diagnosis not present

## 2016-10-09 DIAGNOSIS — G8929 Other chronic pain: Secondary | ICD-10-CM

## 2016-10-09 DIAGNOSIS — M6281 Muscle weakness (generalized): Secondary | ICD-10-CM

## 2016-10-09 DIAGNOSIS — R29898 Other symptoms and signs involving the musculoskeletal system: Secondary | ICD-10-CM

## 2016-10-09 NOTE — Therapy (Signed)
[place], [place], [address] Phone: [phone]   Fax:  [phone]  Physical Therapy Treatment  Patient Details  Name: Cheryl [name] MRN: [id number] Date of Birth: [birth year] Referring Provider: Suella Broad   Encounter Date: 10/09/2016      PT End of Session - 10/09/16 1040    Visit Number 14   Number of Visits 19   Date for PT Re-Evaluation 10/23/16   Authorization Type [employer] Medicare HMO (G-codes done 06-02-22 session)   Authorization Time Period 0/1/02 to 09/18/34; recert done 8/9   Authorization - Visit Number 14   Authorization - Number of Visits 22   PT Start Time 1034   PT Stop Time 1120  5' on Nustep at [facility] (no charge)   PT Time Calculation (min) 46 min   Activity Tolerance Patient tolerated treatment well   Behavior During Therapy [facility] for tasks assessed/performed      Past Medical History:  Diagnosis Date  . AAA (abdominal aortic aneurysm)   . Arthritis    osteoarthritis  . Hyperlipidemia   . Hypertension   . Hypoaldosteronism   . Renal disorder    renal insufficiency  . Sarcoidosis   . Thyroid disease    hypothyroidism    Past Surgical History:  Procedure Laterality Date  . ABDOMINAL AORTIC ANEURYSM REPAIR    . BREAST BIOPSY    . RENAL BIOPSY    . renal vein sampling      There were no vitals filed for this visit.      Subjective Assessment - 10/09/16 1037    Subjective Pt stated she is feeling okay today, no reports of pain currently but has taken 2 pills prior tx.     Pertinent History AAA with history of repair, sarcoidosis, hypothyroidism    Patient Stated Goals get rid of pain, improve balance    Currently in Pain? No/denies                         [facility] Adult PT Treatment/Exercise - 10/09/16 0001      Lumbar Exercises: Aerobic   Stationary Bike Nustep L3 85 SPM x 5 min at [facility] (no charge)     Lumbar Exercises: Standing   Functional Squats 10 reps    Functional Squats Limitations ball at 12in step then heel raise   Forward Lunge 15 reps   Forward Lunge Limitations on floor   Side Lunge 10 reps  on floor cueing for mechanics   Other Standing Lumbar Exercises sidestep with Blue theraband 1RT   Other Standing Lumbar Exercises hip hikes with and without swing 2x10 B      Lumbar Exercises: Sidelying   Hip Abduction 10 reps;20 reps  2x 10 with 2#     Lumbar Exercises: Prone   Straight Leg Raise 15 reps   Straight Leg Raises Limitations knee straight   Opposite Arm/Leg Raise Right arm/Left leg;Left arm/Right leg;5 reps;3 seconds             Balance Exercises - 10/09/16 1047      Balance Exercises: Standing   Sidestepping 2 reps;Theraband  Blue theraband              PT Short Term Goals - 10/02/16 1323      PT SHORT TERM GOAL #1   Title Patient to be able to move through full lumbar ROM without pain in order to show improved muscle strength/core  stability and QOL    Baseline 8/9- full ROM, muscles stretching    Time 3   Period Weeks   Status Achieved     PT SHORT TERM GOAL #2   Title Patient to experience pain as being no more than 2/10 in order to improve QOL and functional task tolerance    Baseline 8/9- 4/10 right now due to weather; at best 0/10 about 50% of the time    Time 3   Period Weeks   Status Partially Met     PT SHORT TERM GOAL #3   Title Paitent to be participatory in regular walking program, at least 15-20 minutes consecutively, at least 1x/day and 7 days per week, in order to improve LE strength and functional activity tolerance    Baseline 8/9- doing walking program every other day 20 minutes in duration    Time 3   Period Weeks   Status Partially Met     PT SHORT TERM GOAL #4   Title Patient to be compliant in correctly performing HEP, to be updated PRN    Baseline 8/9- going OK, balance exercises are still hard; consistently 4x/week    Time 1   Period Weeks   Status Partially Met            PT Long Term Goals - 10/02/16 1326      PT LONG TERM GOAL #1   Title Patient to show MMT as having improved by at least 1 grade in all tested groups in order to reduce pain and improve balance    Baseline 8/9- LE strength has improved but hips remain weak    Time 6   Period Weeks   Status Partially Met     PT LONG TERM GOAL #2   Title Patient to score at least 20 on DGI in order to show improved balance/reduced fall risk    Baseline 8/9- 22   Time 6   Period Weeks   Status Achieved     PT LONG TERM GOAL #3   Title Patient to be participatory in regular exercise program, at least 20 minutes in duration and 4 days per week, in order to maintain functional gains and prevent recurrence of symptoms    Baseline 8/9- ongoing    Time 6   Period Weeks   Status On-going               Plan - 10/09/16 1105    Clinical Impression Statement Session focus on therex for proximal and functional strengthening.  Pt reports she has improved confidence with balance but feels her overall strength and endurance continues to weak.  Progressed proximal strengthening with additonal weight and theraband resistance as appropriate with task.  Added Nustep at [facility] (no charge) to improve activity tolerance.  No reports of pain through session, was limited by fatigue.     Rehab Potential Good   Clinical Impairments Affecting Rehab Potential (+) motivated to participate, success with PT in the past; (-) sedentary lifestyle, recurrent condition    PT Frequency 2x / week   PT Duration 3 weeks   PT Treatment/Interventions ADLs/Self Care Home Management;Biofeedback;DME Instruction;Gait training;Stair training;Functional mobility training;Therapeutic activities;Therapeutic exercise;Balance training;Neuromuscular re-education;Patient/family education;Manual techniques;Energy conservation   PT Next Visit Plan continue strong focus on proximal strength and muscle endurance, balance; continue to encourage  TM and HEP at home    PT Home Exercise Plan Eval: bridges with UE pressdown, sidelying clams with red TB, sit to stand no UEs,  tandem stance; 7/16 SKTC, DKTC, moist heat 7/26: frog stretch 8/9 bridges c holds, sdielying hip flexion, standing hip ABD holds       Patient will benefit from skilled therapeutic intervention in order to improve the following deficits and impairments:  Improper body mechanics, Pain, Decreased coordination, Postural dysfunction, Decreased activity tolerance, Decreased strength, Decreased balance, Difficulty walking  Visit Diagnosis: Chronic midline low back pain without sciatica  Muscle weakness (generalized)  Other symptoms and signs involving the musculoskeletal system  Unsteadiness on feet     Problem List There are no active problems to display for this patient.  [address] LPTA; [place]  Aldona Lento 10/09/2016, 12:51 PM  [facility] [address] [place], [place], [address] Phone: [phone]   Fax:  [phone]  Name: Nyiah Pianka MRN: [id number] Date of Birth: 1950-05-08

## 2016-10-13 ENCOUNTER — Ambulatory Visit (HOSPITAL_COMMUNITY): Payer: Medicare HMO | Admitting: Physical Therapy

## 2016-10-13 DIAGNOSIS — R29898 Other symptoms and signs involving the musculoskeletal system: Secondary | ICD-10-CM

## 2016-10-13 DIAGNOSIS — M545 Low back pain: Secondary | ICD-10-CM | POA: Diagnosis not present

## 2016-10-13 DIAGNOSIS — G8929 Other chronic pain: Secondary | ICD-10-CM

## 2016-10-13 DIAGNOSIS — M6281 Muscle weakness (generalized): Secondary | ICD-10-CM

## 2016-10-13 DIAGNOSIS — R2681 Unsteadiness on feet: Secondary | ICD-10-CM

## 2016-10-13 NOTE — Therapy (Signed)
[place], [place], [address] Phone: [phone]   Fax:  [phone]  Physical Therapy Treatment  Patient Details  Name: Cheryl [name] MRN: [id number] Date of Birth: 16-Feb-1951 Referring Provider: Suella Broad   Encounter Date: 10/13/2016      PT End of Session - 10/13/16 1343    Visit Number 15   Number of Visits 19   Date for PT Re-Evaluation 10/23/16   Authorization Type [employer] Medicare HMO (G-codes done 04/08/22 session)   Authorization Time Period 08/27/51 to 2/99/24; recert done 8/9   Authorization - Visit Number 15   Authorization - Number of Visits 22   PT Start Time 2683   PT Stop Time 1343   PT Time Calculation (min) 38 min   Activity Tolerance Patient tolerated treatment well   Behavior During Therapy [facility] for tasks assessed/performed      Past Medical History:  Diagnosis Date  . AAA (abdominal aortic aneurysm)   . Arthritis    osteoarthritis  . Hyperlipidemia   . Hypertension   . Hypoaldosteronism   . Renal disorder    renal insufficiency  . Sarcoidosis   . Thyroid disease    hypothyroidism    Past Surgical History:  Procedure Laterality Date  . ABDOMINAL AORTIC ANEURYSM REPAIR    . BREAST BIOPSY    . RENAL BIOPSY    . renal vein sampling      There were no vitals filed for this visit.      Subjective Assessment - 10/13/16 1306    Subjective Patient arrives stating no major changes, she did not feel good this weekend so "I did not do anything"    Pertinent History AAA with history of repair, sarcoidosis, hypothyroidism    Patient Stated Goals get rid of pain, improve balance    Currently in Pain? Yes   Pain Score 6   no signs of distress correlating with this pain rating    Pain Location Back   Pain Orientation Lower   Pain Descriptors / Indicators Aching   Pain Type Chronic pain   Pain Radiating Towards none    Pain Onset More than a month ago   Pain Frequency Constant   Aggravating Factors  bad weather    Pain Relieving Factors heat pad    Effect of Pain on Daily Activities just annoying                          [facility] Adult PT Treatment/Exercise - 10/13/16 0001      Lumbar Exercises: Standing   Forward Lunge 20 reps   Forward Lunge Limitations on floor    Other Standing Lumbar Exercises step ups (forward and lateral) with 3# each hand; 4 inch box   6# total for step ups    Other Standing Lumbar Exercises 3 way hip holds x10 B  3 second holds      Lumbar Exercises: Seated   Hip Flexion on Ball Limitations on swiss ball: seated marches and seated opposite/alternating exercise              Balance Exercises - 10/13/16 1322      Balance Exercises: Standing   Tandem Stance Eyes open;Foam/compliant surface;3 reps;20 secs   Other Standing Exercises standing cone taps 4 cones/3 cone combo solid surface; bodyblade on foam horizontal and ertical 2x30 seconds each; standing marches on foam 1x10 B 1 second holds  PT Education - 10/13/16 1343    Education provided No          PT Short Term Goals - 10/02/16 1323      PT SHORT TERM GOAL #1   Title Patient to be able to move through full lumbar ROM without pain in order to show improved muscle strength/core stability and QOL    Baseline 8/9- full ROM, muscles stretching    Time 3   Period Weeks   Status Achieved     PT SHORT TERM GOAL #2   Title Patient to experience pain as being no more than 2/10 in order to improve QOL and functional task tolerance    Baseline 8/9- 4/10 right now due to weather; at best 0/10 about 50% of the time    Time 3   Period Weeks   Status Partially Met     PT SHORT TERM GOAL #3   Title Paitent to be participatory in regular walking program, at least 15-20 minutes consecutively, at least 1x/day and 7 days per week, in order to improve LE strength and functional activity tolerance    Baseline 8/9- doing walking program every other day  20 minutes in duration    Time 3   Period Weeks   Status Partially Met     PT SHORT TERM GOAL #4   Title Patient to be compliant in correctly performing HEP, to be updated PRN    Baseline 8/9- going OK, balance exercises are still hard; consistently 4x/week    Time 1   Period Weeks   Status Partially Met           PT Long Term Goals - 10/02/16 1326      PT LONG TERM GOAL #1   Title Patient to show MMT as having improved by at least 1 grade in all tested groups in order to reduce pain and improve balance    Baseline 8/9- LE strength has improved but hips remain weak    Time 6   Period Weeks   Status Partially Met     PT LONG TERM GOAL #2   Title Patient to score at least 20 on DGI in order to show improved balance/reduced fall risk    Baseline 8/9- 22   Time 6   Period Weeks   Status Achieved     PT LONG TERM GOAL #3   Title Patient to be participatory in regular exercise program, at least 20 minutes in duration and 4 days per week, in order to maintain functional gains and prevent recurrence of symptoms    Baseline 8/9- ongoing    Time 6   Period Weeks   Status On-going               Plan - 10/13/16 1343    Clinical Impression Statement Continued progressing functional strength and balance; balance remains one of patient's main complaints at this time and she describes some bruises she seems to have gotten from possibly bumping into things. Also introduced core work on Stage manager in bars today to attempt to further reduce back pain moving forward. She states "I didn't do anything over the weekend because I wasn't feeling good/like doing it" and was again educated on importance of consistency of regular physical activity including HEP.    Rehab Potential Good   Clinical Impairments Affecting Rehab Potential (+) motivated to participate, success with PT in the past; (-) sedentary lifestyle, recurrent condition    PT Frequency 2x /  week   PT Duration 3 weeks   PT  Treatment/Interventions ADLs/Self Care Home Management;Biofeedback;DME Instruction;Gait training;Stair training;Functional mobility training;Therapeutic activities;Therapeutic exercise;Balance training;Neuromuscular re-education;Patient/family education;Manual techniques;Energy conservation   PT Next Visit Plan continue strong focus on proximal strength and muscle endurance, balance; continue to encourage TM and HEP at home. Continue work on Stage manager.    PT Home Exercise Plan Eval: bridges with UE pressdown, sidelying clams with red TB, sit to stand no UEs, tandem stance; 7/16 SKTC, DKTC, moist heat 7/26: frog stretch 8/9 bridges c holds, sdielying hip flexion, standing hip ABD holds    Consulted and Agree with Plan of Care Patient      Patient will benefit from skilled therapeutic intervention in order to improve the following deficits and impairments:  Improper body mechanics, Pain, Decreased coordination, Postural dysfunction, Decreased activity tolerance, Decreased strength, Decreased balance, Difficulty walking  Visit Diagnosis: Chronic midline low back pain without sciatica  Muscle weakness (generalized)  Other symptoms and signs involving the musculoskeletal system  Unsteadiness on feet     Problem List There are no active problems to display for this patient.   Deniece Ree PT, DPT [phone]  [place] [address] [place], [place], [address] Phone: [phone]   Fax:  [phone]  Name: Cheryl [name] MRN: [id number] Date of Birth: 1950-03-15

## 2016-10-15 ENCOUNTER — Ambulatory Visit (HOSPITAL_COMMUNITY): Payer: Medicare HMO | Admitting: Physical Therapy

## 2016-10-15 DIAGNOSIS — R29898 Other symptoms and signs involving the musculoskeletal system: Secondary | ICD-10-CM

## 2016-10-15 DIAGNOSIS — R2681 Unsteadiness on feet: Secondary | ICD-10-CM

## 2016-10-15 DIAGNOSIS — M545 Low back pain, unspecified: Secondary | ICD-10-CM

## 2016-10-15 DIAGNOSIS — G8929 Other chronic pain: Secondary | ICD-10-CM

## 2016-10-15 DIAGNOSIS — M6281 Muscle weakness (generalized): Secondary | ICD-10-CM

## 2016-10-15 NOTE — Therapy (Signed)
[place], [place], [address] Phone: [phone]   Fax:  [phone]  Physical Therapy Treatment  Patient Details  Name: Cheryl [name] MRN: [id number] Date of Birth: 1950-05-05 Referring Provider: Suella Broad   Encounter Date: 10/15/2016      PT End of Session - 10/15/16 1343    Visit Number 16   Number of Visits 19   Date for PT Re-Evaluation 10/23/16   Authorization Type [employer] HMO (G-codes done 05/24/22 session)   Authorization Time Period 0/3/47 to 06/18/93; recert done 8/9   Authorization - Visit Number 16   Authorization - Number of Visits 22   PT Start Time 6387   PT Stop Time 1343   PT Time Calculation (min) 38 min   Activity Tolerance Patient tolerated treatment well   Behavior During Therapy [facility] for tasks assessed/performed      Past Medical History:  Diagnosis Date  . AAA (abdominal aortic aneurysm)   . Arthritis    osteoarthritis  . Hyperlipidemia   . Hypertension   . Hypoaldosteronism   . Renal disorder    renal insufficiency  . Sarcoidosis   . Thyroid disease    hypothyroidism    Past Surgical History:  Procedure Laterality Date  . ABDOMINAL AORTIC ANEURYSM REPAIR    . BREAST BIOPSY    . RENAL BIOPSY    . renal vein sampling      There were no vitals filed for this visit.      Subjective Assessment - 10/15/16 1308    Subjective Patient arrives reporting no major changes, she is doing well today    Pertinent History AAA with history of repair, sarcoidosis, hypothyroidism    Patient Stated Goals get rid of pain, improve balance    Currently in Pain? Yes   Pain Score 6    Pain Location Back   Pain Orientation Lower;Right                         [facility] Adult PT Treatment/Exercise - 10/15/16 0001      Lumbar Exercises: Standing   Heel Raises 10 reps   Heel Raises Limitations U heel raises, B toe raises on slant    Other Standing Lumbar Exercises  door slides with 4# ( two pounds each hand)   Other Standing Lumbar Exercises 3 way hip holds x10 B  3 second holds      Lumbar Exercises: Seated   Sit to Stand 10 reps   Sit to Stand Limitations eccentric stand to sit              Balance Exercises - 10/15/16 1319      Balance Exercises: Standing   Tandem Stance Eyes open;Foam/compliant surface;3 reps;10 secs  dynadiscs    SLS Other (comment)  SLS ball rolls 3 rounds x5 reps B; 12-6, 3-9, CW and CCW    Standing, One Foot on a Step Eyes open;Foam/compliant surface;6 inch;3 reps;15 secs   Rockerboard Anterior/posterior;Lateral;EO;Intermittent UE support;Other (comment)  2 min lateral, 1 min AP            PT Education - 10/15/16 1343    Education provided Yes   Education Details re-assess next session    Person(s) Educated Patient   Methods Explanation   Comprehension Verbalized understanding          PT Short Term Goals - 10/02/16 1323      PT SHORT TERM  GOAL #1   Title Patient to be able to move through full lumbar ROM without pain in order to show improved muscle strength/core stability and QOL    Baseline 8/9- full ROM, muscles stretching    Time 3   Period Weeks   Status Achieved     PT SHORT TERM GOAL #2   Title Patient to experience pain as being no more than 2/10 in order to improve QOL and functional task tolerance    Baseline 8/9- 4/10 right now due to weather; at best 0/10 about 50% of the time    Time 3   Period Weeks   Status Partially Met     PT SHORT TERM GOAL #3   Title Paitent to be participatory in regular walking program, at least 15-20 minutes consecutively, at least 1x/day and 7 days per week, in order to improve LE strength and functional activity tolerance    Baseline 8/9- doing walking program every other day 20 minutes in duration    Time 3   Period Weeks   Status Partially Met     PT SHORT TERM GOAL #4   Title Patient to be compliant in correctly performing HEP, to be updated  PRN    Baseline 8/9- going OK, balance exercises are still hard; consistently 4x/week    Time 1   Period Weeks   Status Partially Met           PT Long Term Goals - 10/02/16 1326      PT LONG TERM GOAL #1   Title Patient to show MMT as having improved by at least 1 grade in all tested groups in order to reduce pain and improve balance    Baseline 8/9- LE strength has improved but hips remain weak    Time 6   Period Weeks   Status Partially Met     PT LONG TERM GOAL #2   Title Patient to score at least 20 on DGI in order to show improved balance/reduced fall risk    Baseline 8/9- 22   Time 6   Period Weeks   Status Achieved     PT LONG TERM GOAL #3   Title Patient to be participatory in regular exercise program, at least 20 minutes in duration and 4 days per week, in order to maintain functional gains and prevent recurrence of symptoms    Baseline 8/9- ongoing    Time 6   Period Weeks   Status On-going               Plan - 10/15/16 1343    Clinical Impression Statement Patient arrives today stating that she is doing well, no major complaints besides ongoing back pain. Continued progression of hip and LE strengthening primarily in CKC positions. Patient continues to be limited by poor endurance as well as some muscle weakness and balance deficits. Recommend re-assessment within the next 2 sessions to assess progress with skilled interventions at this point.    Rehab Potential Good   Clinical Impairments Affecting Rehab Potential (+) motivated to participate, success with PT in the past; (-) sedentary lifestyle, recurrent condition    PT Frequency 2x / week   PT Duration 3 weeks   PT Treatment/Interventions ADLs/Self Care Home Management;Biofeedback;DME Instruction;Gait training;Stair training;Functional mobility training;Therapeutic activities;Therapeutic exercise;Balance training;Neuromuscular re-education;Patient/family education;Manual techniques;Energy conservation    PT Next Visit Plan re-assess per patient request    PT Home Exercise Plan Eval: bridges with UE pressdown, sidelying clams with red TB,  sit to stand no UEs, tandem stance; 7/16 SKTC, DKTC, moist heat 7/26: frog stretch 8/9 bridges c holds, sdielying hip flexion, standing hip ABD holds    Consulted and Agree with Plan of Care Patient      Patient will benefit from skilled therapeutic intervention in order to improve the following deficits and impairments:  Improper body mechanics, Pain, Decreased coordination, Postural dysfunction, Decreased activity tolerance, Decreased strength, Decreased balance, Difficulty walking  Visit Diagnosis: Chronic midline low back pain without sciatica  Muscle weakness (generalized)  Other symptoms and signs involving the musculoskeletal system  Unsteadiness on feet     Problem List There are no active problems to display for this patient.   Deniece Ree PT, DPT [id number]  [facility] [address] [place], [place], [address] Phone: [phone]   Fax:  [phone]  Name: Darlin Stenseth MRN: [id number] Date of Birth: 07-17-1950

## 2016-10-20 ENCOUNTER — Ambulatory Visit (HOSPITAL_COMMUNITY): Payer: Medicare HMO | Admitting: Physical Therapy

## 2016-10-20 DIAGNOSIS — M545 Low back pain: Principal | ICD-10-CM

## 2016-10-20 DIAGNOSIS — R29898 Other symptoms and signs involving the musculoskeletal system: Secondary | ICD-10-CM

## 2016-10-20 DIAGNOSIS — G8929 Other chronic pain: Secondary | ICD-10-CM

## 2016-10-20 DIAGNOSIS — M6281 Muscle weakness (generalized): Secondary | ICD-10-CM

## 2016-10-20 DIAGNOSIS — R2681 Unsteadiness on feet: Secondary | ICD-10-CM

## 2016-10-20 NOTE — Therapy (Signed)
[place], [place], [address] Phone: [phone]   Fax:  [phone]  Physical Therapy Treatment (Discharge)  Patient Details  Name: Cheryl [name] MRN: [id number] Date of Birth: October 26, 1950 Referring Provider: Suella Broad   Encounter Date: 10/20/2016      PT End of Session - 10/20/16 1707    Visit Number 17   Number of Visits 19   Date for PT Re-Evaluation 10/23/16   Authorization Type [employer] HMO (G-codes done 17th session)   Authorization Time Period 08/27/39 to 9/37/90; recert done 8/9   Authorization - Visit Number 47   Authorization - Number of Visits 22   PT Start Time 2409   PT Stop Time 1553   PT Time Calculation (min) 36 min   Equipment Utilized During Treatment Gait belt   Activity Tolerance Patient tolerated treatment well   Behavior During Therapy [facility] for tasks assessed/performed      Past Medical History:  Diagnosis Date  . AAA (abdominal aortic aneurysm)   . Arthritis    osteoarthritis  . Hyperlipidemia   . Hypertension   . Hypoaldosteronism   . Renal disorder    renal insufficiency  . Sarcoidosis   . Thyroid disease    hypothyroidism    Past Surgical History:  Procedure Laterality Date  . ABDOMINAL AORTIC ANEURYSM REPAIR    . BREAST BIOPSY    . RENAL BIOPSY    . renal vein sampling      There were no vitals filed for this visit.      Subjective Assessment - 10/20/16 1519    Subjective Patient states that she is feeling well, last session was a good workout. SHe rates herself as being an 80/100 with her biggest concern being her back pain. She feels she is doing a whole lot better than when she came in. Her back pain has stayed about the same level, it has not gotten much better or worse. She feels dependent on her pain medicine for her back pain.    Pertinent History AAA with history of repair, sarcoidosis, hypothyroidism    How long can you sit comfortably? 8/27-  unlimited    How long can you stand comfortably? 8/27- 45-60 minutes but she has to march/fidget    How long can you walk comfortably? 8/27- 60 minutes    Patient Stated Goals get rid of pain, improve balance    Currently in Pain? No/denies            [facility] PT Assessment - 10/20/16 0001      AROM   Lumbar Flexion full ROM no pain    Lumbar Extension hyperextension noted, hinging of lumbar spine    Lumbar - Right Side Bend WNL    Lumbar - Left Side Bend WNL      Strength   Right Hip Flexion 4+/5   Right Hip Extension 4+/5   Right Hip ABduction 3+/5   Left Hip Flexion 4+/5   Left Hip Extension 4+/5   Left Hip ABduction 4+/5   Right Knee Flexion 5/5   Right Knee Extension 5/5   Left Knee Flexion 5/5   Left Knee Extension 5/5   Right Ankle Dorsiflexion 5/5   Left Ankle Dorsiflexion 5/5     Dynamic Gait Index   Level Surface Normal   Change in Gait Speed Normal   Gait with Horizontal Head Turns Mild Impairment   Gait with Vertical Head Turns Mild Impairment  Gait and Pivot Turn Normal   Step Over Obstacle Normal   Step Around Obstacles Normal   Steps Normal   Total Score 22   DGI comment: 92%                     [facility] Adult PT Treatment/Exercise - 10/20/16 0001      Lumbar Exercises: Seated   LAQ on Ball Limitations seated opposite/alternating UE/LE flexion with TA set 1x10B     Lumbar Exercises: Quadruped   Opposite Arm/Leg Raise Right arm/Left leg;Left arm/Right leg;Other (comment)  2 reps each, dowel rod    Plank TA contraction in quadruped 15x3 second holds                 PT Education - 10/20/16 1707    Education provided Yes   Education Details Discussed importance of regular physical activity in managing low back pain as well as HEP updates and possible recurrence of severe pain if she returns to sedentary lifestyle moving forward. DC today; local resources such as [employer] and senior center     [employer]) Educated Patient   Methods  Explanation;Handout;Demonstration   Comprehension Verbalized understanding;Returned demonstration;Need further instruction          PT Short Term Goals - 10/20/16 1532      PT SHORT TERM GOAL #1   Title Patient to be able to move through full lumbar ROM without pain in order to show improved muscle strength/core stability and QOL    Baseline 8/27- ROM is not the problem, pain remains    Time 3   Period Weeks   Status Partially Met     PT SHORT TERM GOAL #2   Title Patient to experience pain as being no more than 2/10 in order to improve QOL and functional task tolerance    Baseline 8/27- 0/10 today, 3-4/10 at worst recently    Time 3   Period Weeks   Status Partially Met     PT SHORT TERM GOAL #3   Title Paitent to be participatory in regular walking program, at least 15-20 minutes consecutively, at least 1x/day and 7 days per week, in order to improve LE strength and functional activity tolerance    Baseline 8/27- compliant    Time 3   Period Weeks   Status Achieved     PT SHORT TERM GOAL #4   Title Patient to be compliant in correctly performing HEP, to be updated PRN    Baseline 8/27- 2-3 times per week plus TM   Time 1   Period Weeks   Status Partially Met           PT Long Term Goals - 10/20/16 1535      PT LONG TERM GOAL #1   Title Patient to show MMT as having improved by at least 1 grade in all tested groups in order to reduce pain and improve balance    Baseline 8/27- improved quite a bit    Time 6   Period Weeks   Status Achieved     PT LONG TERM GOAL #2   Title Patient to score at least 20 on DGI in order to show improved balance/reduced fall risk    Baseline 8/27- 22   Time 6   Period Weeks   Status Achieved     PT LONG TERM GOAL #3   Title Patient to be participatory in regular exercise program, at least 20 minutes in duration and 4 days  per week, in order to maintain functional gains and prevent recurrence of symptoms    Baseline 2022-11-01- has not  started this yet   Time 6   Period Weeks   Status On-going               Plan - Oct 31, 2016 1708    Clinical Impression Statement Re-assessment performed today. Patient appears to have made significant improvements in functional strength and balance, however does continue to demonstrate some difficulty with core strength, mild dynamic balance deficit, poor functional activity tolerance, and ongoing complaints of low back pain. Discussed importance of regular physical activity in managing low back pain as well as HEP updates and possible recurrence of severe pain if she returns to sedentary lifestyle moving forward. DC today due to patient being satisfied with her current level of function.    Rehab Potential Good   Clinical Impairments Affecting Rehab Potential (+) motivated to participate, success with PT in the past; (-) sedentary lifestyle, recurrent condition    PT Next Visit Plan DC today    PT Home Exercise Plan Eval: bridges with UE pressdown, sidelying clams with red TB, sit to stand no UEs, tandem stance; 7/16 SKTC, DKTC, moist heat 7/26: frog stretch 8/9 bridges c holds, sdielying hip flexion, standing hip ABD holds  01-Nov-2022: quadruped TA contraction, seated opposite UE/LE, quadruped bird dogs    Consulted and Agree with Plan of Care Patient      Patient will benefit from skilled therapeutic intervention in order to improve the following deficits and impairments:  Improper body mechanics, Pain, Decreased coordination, Postural dysfunction, Decreased activity tolerance, Decreased strength, Decreased balance, Difficulty walking  Visit Diagnosis: Chronic midline low back pain without sciatica  Muscle weakness (generalized)  Other symptoms and signs involving the musculoskeletal system  Unsteadiness on feet       G-Codes - 10-31-16 1709    Functional Assessment Tool Used (Outpatient Only) Based on skilled clinical assessment of strength, balance, pain patterns, fall risk     Functional Limitation Mobility: Walking and moving around   Mobility: Walking and Moving Around Goal Status [phone]) At least 20 percent but less than 40 percent impaired, limited or restricted   Mobility: Walking and Moving Around Discharge Status [id number]) At least 1 percent but less than 20 percent impaired, limited or restricted      Problem List There are no active problems to display for this patient.  PHYSICAL THERAPY DISCHARGE SUMMARY  Visits from Start of Care: 17  Current functional level related to goals / functional outcomes: See above    Remaining deficits: See above    Education / Equipment: See above  Plan: Patient agrees to discharge.  Patient goals were partially met. Patient is being discharged due to being pleased with the current functional level.  ?????      Deniece Ree PT, DPT [facility] [place] [address] [place], [place], [address] Phone: [phone]   Fax:  [phone]  Name: Cheryl [name] MRN: [id number] Date of Birth: Dec 22, 1950

## 2016-10-20 NOTE — Patient Instructions (Signed)
   Belly Breathing Transverse Abdominus Activation- Quadruped  On hands and knees with spine in neutral, take in a big breath allowing the belly to expand downward toward floor. As you exhale, feel the elastic recoil of your belly and follow it by drawing your belly up to your spine. Your spine should not move while performing.   Hold for at least 3 seconds.  Repeat 15-20 times, at least 3 times per day.     SEATED ALTERNATE ARM AND LEG (YOU DO NOT HAVE TO DO THIS ON A BALL, A STANDARD CHAIR IS OK)  While seated on an exercise ball, raise a leg and opposite arm. Return limbs back down and then raise the opposite side.  Do not lean backwards while doing this. Keep your core nice and tight.  Repeat 10-15 times each side, twice a day.    QUADRUPED ALTERNATE ARM AND LEG - BIRD DOG  While in a crawling position, brace at your abdominals and then slowly lift a leg and opposite arm upwards.   Maintain a level and stable pelvis and spine the entire time.    Make sure you are squeezing your stomach muscles.  Repeat 3-5 times each side, 1 time a day.

## 2016-10-22 ENCOUNTER — Ambulatory Visit (HOSPITAL_COMMUNITY): Payer: Medicare HMO | Admitting: Physical Therapy

## 2016-11-03 DIAGNOSIS — E2609 Other primary hyperaldosteronism: Secondary | ICD-10-CM | POA: Diagnosis not present

## 2016-11-03 DIAGNOSIS — E559 Vitamin D deficiency, unspecified: Secondary | ICD-10-CM | POA: Diagnosis not present

## 2016-11-03 DIAGNOSIS — N183 Chronic kidney disease, stage 3 (moderate): Secondary | ICD-10-CM | POA: Diagnosis not present

## 2016-11-03 DIAGNOSIS — E039 Hypothyroidism, unspecified: Secondary | ICD-10-CM | POA: Diagnosis not present

## 2016-11-03 DIAGNOSIS — E782 Mixed hyperlipidemia: Secondary | ICD-10-CM | POA: Diagnosis not present

## 2016-11-06 DIAGNOSIS — M5136 Other intervertebral disc degeneration, lumbar region: Secondary | ICD-10-CM | POA: Diagnosis not present

## 2016-11-11 DIAGNOSIS — E2609 Other primary hyperaldosteronism: Secondary | ICD-10-CM | POA: Diagnosis not present

## 2016-11-11 DIAGNOSIS — E039 Hypothyroidism, unspecified: Secondary | ICD-10-CM | POA: Diagnosis not present

## 2016-11-11 DIAGNOSIS — M8580 Other specified disorders of bone density and structure, unspecified site: Secondary | ICD-10-CM | POA: Diagnosis not present

## 2016-11-11 DIAGNOSIS — Z23 Encounter for immunization: Secondary | ICD-10-CM | POA: Diagnosis not present

## 2016-11-11 DIAGNOSIS — E559 Vitamin D deficiency, unspecified: Secondary | ICD-10-CM | POA: Diagnosis not present

## 2016-11-11 DIAGNOSIS — I151 Hypertension secondary to other renal disorders: Secondary | ICD-10-CM | POA: Diagnosis not present

## 2016-11-11 DIAGNOSIS — E782 Mixed hyperlipidemia: Secondary | ICD-10-CM | POA: Diagnosis not present

## 2016-11-11 DIAGNOSIS — N183 Chronic kidney disease, stage 3 (moderate): Secondary | ICD-10-CM | POA: Diagnosis not present

## 2016-11-15 DIAGNOSIS — M5136 Other intervertebral disc degeneration, lumbar region: Secondary | ICD-10-CM | POA: Diagnosis not present

## 2016-11-25 DIAGNOSIS — M5136 Other intervertebral disc degeneration, lumbar region: Secondary | ICD-10-CM | POA: Diagnosis not present

## 2016-11-25 DIAGNOSIS — M4310 Spondylolisthesis, site unspecified: Secondary | ICD-10-CM | POA: Diagnosis not present

## 2016-12-15 DIAGNOSIS — H2513 Age-related nuclear cataract, bilateral: Secondary | ICD-10-CM | POA: Diagnosis not present

## 2016-12-15 DIAGNOSIS — H40013 Open angle with borderline findings, low risk, bilateral: Secondary | ICD-10-CM | POA: Diagnosis not present

## 2016-12-29 DIAGNOSIS — M150 Primary generalized (osteo)arthritis: Secondary | ICD-10-CM | POA: Diagnosis not present

## 2016-12-29 DIAGNOSIS — D863 Sarcoidosis of skin: Secondary | ICD-10-CM | POA: Diagnosis not present

## 2016-12-29 DIAGNOSIS — R700 Elevated erythrocyte sedimentation rate: Secondary | ICD-10-CM | POA: Diagnosis not present

## 2016-12-29 DIAGNOSIS — E663 Overweight: Secondary | ICD-10-CM | POA: Diagnosis not present

## 2016-12-29 DIAGNOSIS — Z6826 Body mass index (BMI) 26.0-26.9, adult: Secondary | ICD-10-CM | POA: Diagnosis not present

## 2016-12-29 DIAGNOSIS — M545 Low back pain: Secondary | ICD-10-CM | POA: Diagnosis not present

## 2016-12-31 DIAGNOSIS — J301 Allergic rhinitis due to pollen: Secondary | ICD-10-CM | POA: Diagnosis not present

## 2016-12-31 DIAGNOSIS — R05 Cough: Secondary | ICD-10-CM | POA: Diagnosis not present

## 2016-12-31 DIAGNOSIS — J3089 Other allergic rhinitis: Secondary | ICD-10-CM | POA: Diagnosis not present

## 2016-12-31 DIAGNOSIS — J339 Nasal polyp, unspecified: Secondary | ICD-10-CM | POA: Diagnosis not present

## 2017-03-19 DIAGNOSIS — H04123 Dry eye syndrome of bilateral lacrimal glands: Secondary | ICD-10-CM | POA: Diagnosis not present

## 2017-03-19 DIAGNOSIS — H2513 Age-related nuclear cataract, bilateral: Secondary | ICD-10-CM | POA: Diagnosis not present

## 2017-03-19 DIAGNOSIS — H40013 Open angle with borderline findings, low risk, bilateral: Secondary | ICD-10-CM | POA: Diagnosis not present

## 2017-04-06 DIAGNOSIS — L811 Chloasma: Secondary | ICD-10-CM | POA: Diagnosis not present

## 2017-04-06 DIAGNOSIS — L659 Nonscarring hair loss, unspecified: Secondary | ICD-10-CM | POA: Diagnosis not present

## 2017-04-29 DIAGNOSIS — H547 Unspecified visual loss: Secondary | ICD-10-CM | POA: Diagnosis not present

## 2017-04-29 DIAGNOSIS — H04129 Dry eye syndrome of unspecified lacrimal gland: Secondary | ICD-10-CM | POA: Diagnosis not present

## 2017-04-29 DIAGNOSIS — E785 Hyperlipidemia, unspecified: Secondary | ICD-10-CM | POA: Diagnosis not present

## 2017-04-29 DIAGNOSIS — J42 Unspecified chronic bronchitis: Secondary | ICD-10-CM | POA: Diagnosis not present

## 2017-04-29 DIAGNOSIS — E269 Hyperaldosteronism, unspecified: Secondary | ICD-10-CM | POA: Diagnosis not present

## 2017-04-29 DIAGNOSIS — I10 Essential (primary) hypertension: Secondary | ICD-10-CM | POA: Diagnosis not present

## 2017-04-29 DIAGNOSIS — I2510 Atherosclerotic heart disease of native coronary artery without angina pectoris: Secondary | ICD-10-CM | POA: Diagnosis not present

## 2017-04-29 DIAGNOSIS — E039 Hypothyroidism, unspecified: Secondary | ICD-10-CM | POA: Diagnosis not present

## 2017-04-29 DIAGNOSIS — H409 Unspecified glaucoma: Secondary | ICD-10-CM | POA: Diagnosis not present

## 2017-04-29 DIAGNOSIS — R69 Illness, unspecified: Secondary | ICD-10-CM | POA: Diagnosis not present

## 2017-05-11 DIAGNOSIS — E559 Vitamin D deficiency, unspecified: Secondary | ICD-10-CM | POA: Diagnosis not present

## 2017-05-11 DIAGNOSIS — N183 Chronic kidney disease, stage 3 (moderate): Secondary | ICD-10-CM | POA: Diagnosis not present

## 2017-05-11 DIAGNOSIS — E039 Hypothyroidism, unspecified: Secondary | ICD-10-CM | POA: Diagnosis not present

## 2017-05-11 DIAGNOSIS — E2609 Other primary hyperaldosteronism: Secondary | ICD-10-CM | POA: Diagnosis not present

## 2017-05-11 DIAGNOSIS — E782 Mixed hyperlipidemia: Secondary | ICD-10-CM | POA: Diagnosis not present

## 2017-05-11 DIAGNOSIS — I151 Hypertension secondary to other renal disorders: Secondary | ICD-10-CM | POA: Diagnosis not present

## 2017-05-15 DIAGNOSIS — E2609 Other primary hyperaldosteronism: Secondary | ICD-10-CM | POA: Diagnosis not present

## 2017-05-15 DIAGNOSIS — E782 Mixed hyperlipidemia: Secondary | ICD-10-CM | POA: Diagnosis not present

## 2017-05-15 DIAGNOSIS — E039 Hypothyroidism, unspecified: Secondary | ICD-10-CM | POA: Diagnosis not present

## 2017-05-15 DIAGNOSIS — I151 Hypertension secondary to other renal disorders: Secondary | ICD-10-CM | POA: Diagnosis not present

## 2017-05-15 DIAGNOSIS — N183 Chronic kidney disease, stage 3 (moderate): Secondary | ICD-10-CM | POA: Diagnosis not present

## 2017-05-15 DIAGNOSIS — E559 Vitamin D deficiency, unspecified: Secondary | ICD-10-CM | POA: Diagnosis not present

## 2017-05-15 DIAGNOSIS — M8580 Other specified disorders of bone density and structure, unspecified site: Secondary | ICD-10-CM | POA: Diagnosis not present

## 2017-06-25 DIAGNOSIS — R700 Elevated erythrocyte sedimentation rate: Secondary | ICD-10-CM | POA: Diagnosis not present

## 2017-06-25 DIAGNOSIS — M545 Low back pain: Secondary | ICD-10-CM | POA: Diagnosis not present

## 2017-06-25 DIAGNOSIS — D863 Sarcoidosis of skin: Secondary | ICD-10-CM | POA: Diagnosis not present

## 2017-06-25 DIAGNOSIS — M150 Primary generalized (osteo)arthritis: Secondary | ICD-10-CM | POA: Diagnosis not present

## 2017-06-25 DIAGNOSIS — M3500 Sicca syndrome, unspecified: Secondary | ICD-10-CM | POA: Diagnosis not present

## 2017-06-25 DIAGNOSIS — Z6826 Body mass index (BMI) 26.0-26.9, adult: Secondary | ICD-10-CM | POA: Diagnosis not present

## 2017-06-25 DIAGNOSIS — E663 Overweight: Secondary | ICD-10-CM | POA: Diagnosis not present

## 2017-07-07 DIAGNOSIS — J301 Allergic rhinitis due to pollen: Secondary | ICD-10-CM | POA: Diagnosis not present

## 2017-07-07 DIAGNOSIS — R05 Cough: Secondary | ICD-10-CM | POA: Diagnosis not present

## 2017-07-07 DIAGNOSIS — J3089 Other allergic rhinitis: Secondary | ICD-10-CM | POA: Diagnosis not present

## 2017-07-07 DIAGNOSIS — J339 Nasal polyp, unspecified: Secondary | ICD-10-CM | POA: Diagnosis not present

## 2017-11-16 DIAGNOSIS — E559 Vitamin D deficiency, unspecified: Secondary | ICD-10-CM | POA: Diagnosis not present

## 2017-11-16 DIAGNOSIS — E2609 Other primary hyperaldosteronism: Secondary | ICD-10-CM | POA: Diagnosis not present

## 2017-11-16 DIAGNOSIS — E782 Mixed hyperlipidemia: Secondary | ICD-10-CM | POA: Diagnosis not present

## 2017-11-16 DIAGNOSIS — E039 Hypothyroidism, unspecified: Secondary | ICD-10-CM | POA: Diagnosis not present

## 2017-11-16 DIAGNOSIS — N183 Chronic kidney disease, stage 3 (moderate): Secondary | ICD-10-CM | POA: Diagnosis not present

## 2017-11-19 DIAGNOSIS — E559 Vitamin D deficiency, unspecified: Secondary | ICD-10-CM | POA: Diagnosis not present

## 2017-11-19 DIAGNOSIS — M8580 Other specified disorders of bone density and structure, unspecified site: Secondary | ICD-10-CM | POA: Diagnosis not present

## 2017-11-19 DIAGNOSIS — E2609 Other primary hyperaldosteronism: Secondary | ICD-10-CM | POA: Diagnosis not present

## 2017-11-19 DIAGNOSIS — I151 Hypertension secondary to other renal disorders: Secondary | ICD-10-CM | POA: Diagnosis not present

## 2017-11-19 DIAGNOSIS — N183 Chronic kidney disease, stage 3 (moderate): Secondary | ICD-10-CM | POA: Diagnosis not present

## 2017-11-19 DIAGNOSIS — Z23 Encounter for immunization: Secondary | ICD-10-CM | POA: Diagnosis not present

## 2017-11-19 DIAGNOSIS — E782 Mixed hyperlipidemia: Secondary | ICD-10-CM | POA: Diagnosis not present

## 2017-11-19 DIAGNOSIS — Z79899 Other long term (current) drug therapy: Secondary | ICD-10-CM | POA: Diagnosis not present

## 2017-11-19 DIAGNOSIS — E039 Hypothyroidism, unspecified: Secondary | ICD-10-CM | POA: Diagnosis not present

## 2018-01-01 DIAGNOSIS — E663 Overweight: Secondary | ICD-10-CM | POA: Diagnosis not present

## 2018-01-01 DIAGNOSIS — M545 Low back pain: Secondary | ICD-10-CM | POA: Diagnosis not present

## 2018-01-01 DIAGNOSIS — H9313 Tinnitus, bilateral: Secondary | ICD-10-CM | POA: Diagnosis not present

## 2018-01-01 DIAGNOSIS — R700 Elevated erythrocyte sedimentation rate: Secondary | ICD-10-CM | POA: Diagnosis not present

## 2018-01-01 DIAGNOSIS — Z6826 Body mass index (BMI) 26.0-26.9, adult: Secondary | ICD-10-CM | POA: Diagnosis not present

## 2018-01-01 DIAGNOSIS — D863 Sarcoidosis of skin: Secondary | ICD-10-CM | POA: Diagnosis not present

## 2018-01-01 DIAGNOSIS — M150 Primary generalized (osteo)arthritis: Secondary | ICD-10-CM | POA: Diagnosis not present

## 2018-01-01 DIAGNOSIS — M3500 Sicca syndrome, unspecified: Secondary | ICD-10-CM | POA: Diagnosis not present

## 2018-01-04 DIAGNOSIS — R69 Illness, unspecified: Secondary | ICD-10-CM | POA: Diagnosis not present

## 2018-01-25 DIAGNOSIS — J301 Allergic rhinitis due to pollen: Secondary | ICD-10-CM | POA: Diagnosis not present

## 2018-01-25 DIAGNOSIS — J339 Nasal polyp, unspecified: Secondary | ICD-10-CM | POA: Diagnosis not present

## 2018-01-25 DIAGNOSIS — R05 Cough: Secondary | ICD-10-CM | POA: Diagnosis not present

## 2018-01-25 DIAGNOSIS — J3089 Other allergic rhinitis: Secondary | ICD-10-CM | POA: Diagnosis not present

## 2018-03-01 DIAGNOSIS — E785 Hyperlipidemia, unspecified: Secondary | ICD-10-CM | POA: Diagnosis not present

## 2018-03-01 DIAGNOSIS — E039 Hypothyroidism, unspecified: Secondary | ICD-10-CM | POA: Diagnosis not present

## 2018-03-01 DIAGNOSIS — J45909 Unspecified asthma, uncomplicated: Secondary | ICD-10-CM | POA: Diagnosis not present

## 2018-03-01 DIAGNOSIS — M1990 Unspecified osteoarthritis, unspecified site: Secondary | ICD-10-CM | POA: Diagnosis not present

## 2018-03-01 DIAGNOSIS — R69 Illness, unspecified: Secondary | ICD-10-CM | POA: Diagnosis not present

## 2018-03-01 DIAGNOSIS — K5900 Constipation, unspecified: Secondary | ICD-10-CM | POA: Diagnosis not present

## 2018-03-01 DIAGNOSIS — Z8249 Family history of ischemic heart disease and other diseases of the circulatory system: Secondary | ICD-10-CM | POA: Diagnosis not present

## 2018-03-01 DIAGNOSIS — H04129 Dry eye syndrome of unspecified lacrimal gland: Secondary | ICD-10-CM | POA: Diagnosis not present

## 2018-03-01 DIAGNOSIS — I10 Essential (primary) hypertension: Secondary | ICD-10-CM | POA: Diagnosis not present

## 2018-03-01 DIAGNOSIS — H409 Unspecified glaucoma: Secondary | ICD-10-CM | POA: Diagnosis not present

## 2018-04-06 DIAGNOSIS — H2513 Age-related nuclear cataract, bilateral: Secondary | ICD-10-CM | POA: Diagnosis not present

## 2018-04-06 DIAGNOSIS — H40023 Open angle with borderline findings, high risk, bilateral: Secondary | ICD-10-CM | POA: Diagnosis not present

## 2018-04-06 DIAGNOSIS — H04123 Dry eye syndrome of bilateral lacrimal glands: Secondary | ICD-10-CM | POA: Diagnosis not present

## 2018-04-22 DIAGNOSIS — Z1231 Encounter for screening mammogram for malignant neoplasm of breast: Secondary | ICD-10-CM | POA: Diagnosis not present

## 2018-05-20 DIAGNOSIS — N183 Chronic kidney disease, stage 3 (moderate): Secondary | ICD-10-CM | POA: Diagnosis not present

## 2018-05-20 DIAGNOSIS — E2609 Other primary hyperaldosteronism: Secondary | ICD-10-CM | POA: Diagnosis not present

## 2018-05-20 DIAGNOSIS — E782 Mixed hyperlipidemia: Secondary | ICD-10-CM | POA: Diagnosis not present

## 2018-05-20 DIAGNOSIS — E039 Hypothyroidism, unspecified: Secondary | ICD-10-CM | POA: Diagnosis not present

## 2018-05-20 DIAGNOSIS — E559 Vitamin D deficiency, unspecified: Secondary | ICD-10-CM | POA: Diagnosis not present

## 2018-05-21 DIAGNOSIS — M8580 Other specified disorders of bone density and structure, unspecified site: Secondary | ICD-10-CM | POA: Diagnosis not present

## 2018-05-21 DIAGNOSIS — E039 Hypothyroidism, unspecified: Secondary | ICD-10-CM | POA: Diagnosis not present

## 2018-05-21 DIAGNOSIS — D72829 Elevated white blood cell count, unspecified: Secondary | ICD-10-CM | POA: Diagnosis not present

## 2018-05-21 DIAGNOSIS — D649 Anemia, unspecified: Secondary | ICD-10-CM | POA: Diagnosis not present

## 2018-05-21 DIAGNOSIS — N183 Chronic kidney disease, stage 3 (moderate): Secondary | ICD-10-CM | POA: Diagnosis not present

## 2018-05-21 DIAGNOSIS — E2609 Other primary hyperaldosteronism: Secondary | ICD-10-CM | POA: Diagnosis not present

## 2018-05-21 DIAGNOSIS — I151 Hypertension secondary to other renal disorders: Secondary | ICD-10-CM | POA: Diagnosis not present

## 2018-05-21 DIAGNOSIS — E782 Mixed hyperlipidemia: Secondary | ICD-10-CM | POA: Diagnosis not present

## 2018-05-21 DIAGNOSIS — E559 Vitamin D deficiency, unspecified: Secondary | ICD-10-CM | POA: Diagnosis not present

## 2018-07-15 DIAGNOSIS — H04123 Dry eye syndrome of bilateral lacrimal glands: Secondary | ICD-10-CM | POA: Diagnosis not present

## 2018-07-15 DIAGNOSIS — H2513 Age-related nuclear cataract, bilateral: Secondary | ICD-10-CM | POA: Diagnosis not present

## 2018-07-15 DIAGNOSIS — H40023 Open angle with borderline findings, high risk, bilateral: Secondary | ICD-10-CM | POA: Diagnosis not present

## 2018-08-16 DIAGNOSIS — J3089 Other allergic rhinitis: Secondary | ICD-10-CM | POA: Diagnosis not present

## 2018-08-16 DIAGNOSIS — R05 Cough: Secondary | ICD-10-CM | POA: Diagnosis not present

## 2018-08-16 DIAGNOSIS — J301 Allergic rhinitis due to pollen: Secondary | ICD-10-CM | POA: Diagnosis not present

## 2018-08-16 DIAGNOSIS — H9312 Tinnitus, left ear: Secondary | ICD-10-CM | POA: Diagnosis not present

## 2018-11-08 DIAGNOSIS — N183 Chronic kidney disease, stage 3 (moderate): Secondary | ICD-10-CM | POA: Diagnosis not present

## 2018-11-08 DIAGNOSIS — E782 Mixed hyperlipidemia: Secondary | ICD-10-CM | POA: Diagnosis not present

## 2018-11-08 DIAGNOSIS — E559 Vitamin D deficiency, unspecified: Secondary | ICD-10-CM | POA: Diagnosis not present

## 2018-11-08 DIAGNOSIS — E2609 Other primary hyperaldosteronism: Secondary | ICD-10-CM | POA: Diagnosis not present

## 2018-11-08 DIAGNOSIS — I152 Hypertension secondary to endocrine disorders: Secondary | ICD-10-CM | POA: Diagnosis not present

## 2018-11-08 DIAGNOSIS — E039 Hypothyroidism, unspecified: Secondary | ICD-10-CM | POA: Diagnosis not present

## 2018-11-23 DIAGNOSIS — E039 Hypothyroidism, unspecified: Secondary | ICD-10-CM | POA: Diagnosis not present

## 2018-11-23 DIAGNOSIS — M8580 Other specified disorders of bone density and structure, unspecified site: Secondary | ICD-10-CM | POA: Diagnosis not present

## 2018-11-23 DIAGNOSIS — I152 Hypertension secondary to endocrine disorders: Secondary | ICD-10-CM | POA: Diagnosis not present

## 2018-11-23 DIAGNOSIS — E559 Vitamin D deficiency, unspecified: Secondary | ICD-10-CM | POA: Diagnosis not present

## 2018-11-23 DIAGNOSIS — N182 Chronic kidney disease, stage 2 (mild): Secondary | ICD-10-CM | POA: Diagnosis not present

## 2018-11-23 DIAGNOSIS — Z23 Encounter for immunization: Secondary | ICD-10-CM | POA: Diagnosis not present

## 2018-11-23 DIAGNOSIS — E782 Mixed hyperlipidemia: Secondary | ICD-10-CM | POA: Diagnosis not present

## 2018-11-23 DIAGNOSIS — E2609 Other primary hyperaldosteronism: Secondary | ICD-10-CM | POA: Diagnosis not present

## 2018-11-24 DIAGNOSIS — Z23 Encounter for immunization: Secondary | ICD-10-CM | POA: Diagnosis not present

## 2019-01-03 DIAGNOSIS — M3500 Sicca syndrome, unspecified: Secondary | ICD-10-CM | POA: Diagnosis not present

## 2019-01-03 DIAGNOSIS — D863 Sarcoidosis of skin: Secondary | ICD-10-CM | POA: Diagnosis not present

## 2019-01-03 DIAGNOSIS — M545 Low back pain: Secondary | ICD-10-CM | POA: Diagnosis not present

## 2019-01-03 DIAGNOSIS — E663 Overweight: Secondary | ICD-10-CM | POA: Diagnosis not present

## 2019-01-03 DIAGNOSIS — M150 Primary generalized (osteo)arthritis: Secondary | ICD-10-CM | POA: Diagnosis not present

## 2019-01-03 DIAGNOSIS — Z6825 Body mass index (BMI) 25.0-25.9, adult: Secondary | ICD-10-CM | POA: Diagnosis not present

## 2019-01-24 DIAGNOSIS — H16223 Keratoconjunctivitis sicca, not specified as Sjogren's, bilateral: Secondary | ICD-10-CM | POA: Diagnosis not present

## 2019-01-24 DIAGNOSIS — H2513 Age-related nuclear cataract, bilateral: Secondary | ICD-10-CM | POA: Diagnosis not present

## 2019-01-24 DIAGNOSIS — H40023 Open angle with borderline findings, high risk, bilateral: Secondary | ICD-10-CM | POA: Diagnosis not present

## 2019-02-24 DIAGNOSIS — H0102B Squamous blepharitis left eye, upper and lower eyelids: Secondary | ICD-10-CM | POA: Diagnosis not present

## 2019-02-24 DIAGNOSIS — H0102A Squamous blepharitis right eye, upper and lower eyelids: Secondary | ICD-10-CM | POA: Diagnosis not present

## 2019-02-24 DIAGNOSIS — H16223 Keratoconjunctivitis sicca, not specified as Sjogren's, bilateral: Secondary | ICD-10-CM | POA: Diagnosis not present

## 2019-02-24 DIAGNOSIS — H2513 Age-related nuclear cataract, bilateral: Secondary | ICD-10-CM | POA: Diagnosis not present

## 2019-02-24 DIAGNOSIS — H40023 Open angle with borderline findings, high risk, bilateral: Secondary | ICD-10-CM | POA: Diagnosis not present

## 2019-04-02 ENCOUNTER — Other Ambulatory Visit: Payer: Self-pay

## 2019-04-02 ENCOUNTER — Ambulatory Visit: Payer: Medicare HMO | Attending: Internal Medicine

## 2019-04-02 DIAGNOSIS — Z23 Encounter for immunization: Secondary | ICD-10-CM | POA: Insufficient documentation

## 2019-04-02 NOTE — Progress Notes (Signed)
   Covid-19 Vaccination Clinic  Name:  Beaux Enerson    MRN: [id number] DOB: Feb 07, 1951  04/02/2019  Ms. Fanton was observed post Covid-19 immunization for 15 minutes without incidence. She was provided with Vaccine Information Sheet and instruction to access the V-Safe system.   Ms. Ervine was instructed to call 911 with any severe reactions post vaccine: Marland Kitchen Difficulty breathing  . Swelling of your face and throat  . A fast heartbeat  . A bad rash all over your body  . Dizziness and weakness    Immunizations Administered    Name Date Dose VIS Date Route   Moderna COVID-19 Vaccine 04/02/2019 10:40 AM 0.5 mL 01/25/2019 Intramuscular   Manufacturer: Moderna   Lot: [id number]   [id number]

## 2019-05-03 ENCOUNTER — Ambulatory Visit: Payer: Medicare HMO | Attending: Internal Medicine

## 2019-05-03 DIAGNOSIS — Z23 Encounter for immunization: Secondary | ICD-10-CM | POA: Insufficient documentation

## 2019-05-03 NOTE — Progress Notes (Signed)
   Covid-19 Vaccination Clinic  Name:  Ruhani Umland    MRN: [id number] DOB: 1950/05/11  05/03/2019  Ms. Gehl was observed post Covid-19 immunization for 15 minutes without incident. She was provided with Vaccine Information Sheet and instruction to access the V-Safe system.   Ms. Mackowski was instructed to call 911 with any severe reactions post vaccine: Marland Kitchen Difficulty breathing  . Swelling of face and throat  . A fast heartbeat  . A bad rash all over body  . Dizziness and weakness   Immunizations Administered    Name Date Dose VIS Date Route   Moderna COVID-19 Vaccine 05/03/2019 10:37 AM 0.5 mL 01/25/2019 Intramuscular   Manufacturer: Moderna   Lot: [id number]   [place]: [id number]

## 2019-05-06 LAB — COLOGUARD
COLOGUARD: NEGATIVE
Cologuard: NEGATIVE

## 2019-05-06 LAB — EXTERNAL GENERIC LAB PROCEDURE: COLOGUARD: NEGATIVE

## 2019-05-15 LAB — COLOGUARD: Cologuard: NEGATIVE

## 2019-05-18 ENCOUNTER — Ambulatory Visit: Payer: Medicare HMO | Attending: Internal Medicine

## 2019-05-18 ENCOUNTER — Other Ambulatory Visit: Payer: Self-pay

## 2019-05-18 DIAGNOSIS — Z20822 Contact with and (suspected) exposure to covid-19: Secondary | ICD-10-CM

## 2019-05-19 LAB — SARS-COV-2, NAA 2 DAY TAT

## 2019-05-19 LAB — NOVEL CORONAVIRUS, NAA: SARS-CoV-2, NAA: NOT DETECTED

## 2020-01-12 ENCOUNTER — Ambulatory Visit: Payer: Medicare HMO | Attending: Internal Medicine

## 2020-01-12 DIAGNOSIS — Z23 Encounter for immunization: Secondary | ICD-10-CM

## 2020-01-12 NOTE — Progress Notes (Signed)
° °  Covid-19 Vaccination Clinic  Name:  Cheryl [name]    MRN: [id number] DOB: [birth year]  01/12/2020  [name] was observed post Covid-19 immunization for 15 minutes without incident. She was provided with Vaccine Information Sheet and instruction to access the V-Safe system.   [name] was instructed to call 911 with any severe reactions post vaccine:  Difficulty breathing   Swelling of face and throat   A fast heartbeat   A bad rash all over body   Dizziness and weakness   Immunizations Administered    No immunizations on file.

## 2020-02-28 ENCOUNTER — Other Ambulatory Visit: Payer: Self-pay

## 2020-02-28 ENCOUNTER — Encounter (INDEPENDENT_AMBULATORY_CARE_PROVIDER_SITE_OTHER): Payer: Medicare HMO | Admitting: Ophthalmology

## 2020-02-28 DIAGNOSIS — H3509 Other intraretinal microvascular abnormalities: Secondary | ICD-10-CM

## 2020-02-28 DIAGNOSIS — H35033 Hypertensive retinopathy, bilateral: Secondary | ICD-10-CM | POA: Diagnosis not present

## 2020-02-28 DIAGNOSIS — I10 Essential (primary) hypertension: Secondary | ICD-10-CM

## 2020-02-28 DIAGNOSIS — H43813 Vitreous degeneration, bilateral: Secondary | ICD-10-CM

## 2020-05-09 LAB — HM MAMMOGRAPHY: HM Mammogram: NORMAL (ref 0–4)

## 2020-06-25 ENCOUNTER — Encounter (INDEPENDENT_AMBULATORY_CARE_PROVIDER_SITE_OTHER): Payer: Medicare HMO | Admitting: Ophthalmology

## 2020-06-27 ENCOUNTER — Encounter (INDEPENDENT_AMBULATORY_CARE_PROVIDER_SITE_OTHER): Payer: Medicare HMO | Admitting: Ophthalmology

## 2020-06-28 ENCOUNTER — Encounter (INDEPENDENT_AMBULATORY_CARE_PROVIDER_SITE_OTHER): Payer: Medicare HMO | Admitting: Ophthalmology

## 2020-07-05 ENCOUNTER — Other Ambulatory Visit: Payer: Self-pay

## 2020-07-05 ENCOUNTER — Encounter (INDEPENDENT_AMBULATORY_CARE_PROVIDER_SITE_OTHER): Payer: Medicare HMO | Admitting: Ophthalmology

## 2020-07-05 DIAGNOSIS — I10 Essential (primary) hypertension: Secondary | ICD-10-CM

## 2020-07-05 DIAGNOSIS — H35033 Hypertensive retinopathy, bilateral: Secondary | ICD-10-CM | POA: Diagnosis not present

## 2020-07-05 DIAGNOSIS — H35049 Retinal micro-aneurysms, unspecified, unspecified eye: Secondary | ICD-10-CM | POA: Diagnosis not present

## 2020-07-05 DIAGNOSIS — H43813 Vitreous degeneration, bilateral: Secondary | ICD-10-CM

## 2020-07-05 DIAGNOSIS — H2513 Age-related nuclear cataract, bilateral: Secondary | ICD-10-CM

## 2020-11-26 LAB — LIPID PANEL
Cholesterol: 138 (ref 0–200)
HDL: 54 (ref 35–70)
LDL Cholesterol: 71
Triglycerides: 74 (ref 40–160)

## 2020-11-26 LAB — COMPREHENSIVE METABOLIC PANEL
Albumin: 3.7 (ref 3.5–5.0)
Calcium: 10 (ref 8.7–10.7)

## 2020-11-26 LAB — HEPATIC FUNCTION PANEL
ALT: 10 (ref 7–35)
AST: 23 (ref 13–35)
Alkaline Phosphatase: 61 (ref 25–125)
Bilirubin, Total: 0.4

## 2020-11-26 LAB — BASIC METABOLIC PANEL
BUN: 27 — AB (ref 4–21)
CO2: 24 — AB (ref 13–22)
Chloride: 109 — AB (ref 99–108)
Creatinine: 1.6 — AB (ref 0.5–1.1)
Glucose: 79
Potassium: 4.6 (ref 3.4–5.3)
Sodium: 141 (ref 137–147)

## 2020-11-26 LAB — CBC AND DIFFERENTIAL
HCT: 33 — AB (ref 36–46)
Hemoglobin: 10.9 — AB (ref 12.0–16.0)
Platelets: 207 (ref 150–399)
WBC: 5.6

## 2020-11-26 LAB — TSH: TSH: 2.11 (ref 0.41–5.90)

## 2021-03-19 ENCOUNTER — Other Ambulatory Visit: Payer: Self-pay

## 2021-03-19 ENCOUNTER — Ambulatory Visit (INDEPENDENT_AMBULATORY_CARE_PROVIDER_SITE_OTHER): Payer: Medicare HMO | Admitting: Internal Medicine

## 2021-03-19 ENCOUNTER — Encounter: Payer: Self-pay | Admitting: Internal Medicine

## 2021-03-19 VITALS — BP 124/72 | HR 79 | Temp 97.6°F | Ht 64.0 in | Wt 142.0 lb

## 2021-03-19 DIAGNOSIS — I10 Essential (primary) hypertension: Secondary | ICD-10-CM | POA: Insufficient documentation

## 2021-03-19 DIAGNOSIS — Z23 Encounter for immunization: Secondary | ICD-10-CM

## 2021-03-19 DIAGNOSIS — N184 Chronic kidney disease, stage 4 (severe): Secondary | ICD-10-CM

## 2021-03-19 DIAGNOSIS — E2740 Unspecified adrenocortical insufficiency: Secondary | ICD-10-CM

## 2021-03-19 DIAGNOSIS — R9431 Abnormal electrocardiogram [ECG] [EKG]: Secondary | ICD-10-CM

## 2021-03-19 DIAGNOSIS — Z1231 Encounter for screening mammogram for malignant neoplasm of breast: Secondary | ICD-10-CM

## 2021-03-19 DIAGNOSIS — R5382 Chronic fatigue, unspecified: Secondary | ICD-10-CM

## 2021-03-19 DIAGNOSIS — E785 Hyperlipidemia, unspecified: Secondary | ICD-10-CM | POA: Insufficient documentation

## 2021-03-19 DIAGNOSIS — D539 Nutritional anemia, unspecified: Secondary | ICD-10-CM

## 2021-03-19 DIAGNOSIS — I152 Hypertension secondary to endocrine disorders: Secondary | ICD-10-CM | POA: Diagnosis not present

## 2021-03-19 DIAGNOSIS — Z8679 Personal history of other diseases of the circulatory system: Secondary | ICD-10-CM | POA: Insufficient documentation

## 2021-03-19 DIAGNOSIS — Z0001 Encounter for general adult medical examination with abnormal findings: Secondary | ICD-10-CM

## 2021-03-19 DIAGNOSIS — Z9889 Other specified postprocedural states: Secondary | ICD-10-CM

## 2021-03-19 DIAGNOSIS — D869 Sarcoidosis, unspecified: Secondary | ICD-10-CM | POA: Diagnosis not present

## 2021-03-19 LAB — BASIC METABOLIC PANEL
BUN: 31 mg/dL — ABNORMAL HIGH (ref 6–23)
CO2: 26 mEq/L (ref 19–32)
Calcium: 9.7 mg/dL (ref 8.4–10.5)
Chloride: 107 mEq/L (ref 96–112)
Creatinine, Ser: 1.66 mg/dL — ABNORMAL HIGH (ref 0.40–1.20)
GFR: 30.57 mL/min — ABNORMAL LOW (ref >60.00)
Glucose, Bld: 89 mg/dL (ref 70–99)
Potassium: 3.8 mEq/L (ref 3.5–5.1)
Sodium: 141 mEq/L (ref 135–145)

## 2021-03-19 LAB — IBC + FERRITIN
Ferritin: 61.8 ng/mL (ref 10.0–291.0)
Iron: 65 ug/dL (ref 42–145)
Saturation Ratios: 21.5 % (ref 20.0–50.0)
TIBC: 302.4 ug/dL (ref 250.0–450.0)
Transferrin: 216 mg/dL (ref 212.0–360.0)

## 2021-03-19 LAB — VITAMIN B12: Vitamin B-12: 513 pg/mL (ref 211–911)

## 2021-03-19 LAB — BRAIN NATRIURETIC PEPTIDE: Pro B Natriuretic peptide (BNP): 201 pg/mL — ABNORMAL HIGH (ref 0.0–100.0)

## 2021-03-19 LAB — FOLATE: Folate: 10.9 ng/mL (ref >5.9)

## 2021-03-19 MED ORDER — BOOSTRIX 5-2.5-18.5 LF-MCG/0.5 IM SUSP: 0.5000 mL | Freq: Once | INTRAMUSCULAR | 0 refills | Status: AC

## 2021-03-19 NOTE — Patient Instructions (Signed)

## 2021-03-19 NOTE — Progress Notes (Signed)
Subjective:  Patient ID: Cheryl [name], female    DOB: 24-Mar-1950  Age: 71 y.o. MRN: [id number]  CC: Annual Exam and Anemia  This visit occurred during the SARS-CoV-2 public health emergency.  Safety protocols were in place, including screening questions prior to the visit, additional usage of staff PPE, and extensive cleaning of exam room while observing appropriate contact time as indicated for disinfecting solutions.    HPI Cheryl [name] presents for a CPX and to establish.  She has a history of repaired thoracic aortic aneurysm.  She tells me she is due for follow-up with cardiothoracic surgery.  She is active and denies chest pain, shortness of breath, diaphoresis, dizziness, lightheadedness, palpitations, or edema.  She complains of a 1 year history of fatigue.  History Cheryl [name] has a past medical history of AAA (abdominal aortic aneurysm), Arthritis, Hyperlipidemia, Hypertension, Hypoaldosteronism ([place]), Renal disorder, Sarcoidosis, and Thyroid disease.   She has a past surgical history that includes Abdominal aortic aneurysm repair; Breast biopsy; renal vein sampling; and Renal biopsy.   Her family history is not on file.She reports that she has never smoked. She does not have any smokeless tobacco history on file. She reports that she does not drink alcohol and does not use drugs.  Outpatient Medications Prior to Visit  Medication Sig Dispense Refill   BIOTIN PO Take by mouth.     Cholecalciferol (VITAMIN D PO) Take by mouth.     diclofenac sodium (VOLTAREN) 1 % GEL Apply topically 4 (four) times daily.     Levothyroxine Sodium (SYNTHROID PO) Take by mouth.     METOPROLOL TARTRATE PO Take by mouth.     Omega-3 Fatty Acids (FISH OIL PO) Take by mouth.     Rosuvastatin Calcium (CRESTOR PO) Take by mouth.     SPIRONOLACTONE PO Take by mouth.     aspirin 81 MG tablet Take 81 mg by mouth daily.     colchicine 0.6 MG tablet Take 2 now and 1 in 1 hour.  May repeat dose once  daily.  For gout attack. 12 tablet 0   No facility-administered medications prior to visit.    ROS Review of Systems  Constitutional:  Positive for fatigue. Negative for chills, diaphoresis and fever.  HENT: Negative.    Eyes: Negative.   Respiratory:  Negative for cough, chest tightness, shortness of breath and wheezing.   Cardiovascular:  Negative for chest pain, palpitations and leg swelling.  Gastrointestinal:  Positive for constipation. Negative for abdominal pain, diarrhea, nausea and vomiting.  Endocrine: Negative.   Genitourinary: Negative.  Negative for difficulty urinating and hematuria.  Musculoskeletal: Negative.  Negative for arthralgias and myalgias.  Skin: Negative.  Negative for color change.  Neurological: Negative.  Negative for dizziness, weakness and light-headedness.  Hematological:  Negative for adenopathy. Does not bruise/bleed easily.  Psychiatric/Behavioral:  Positive for confusion. Negative for suicidal ideas. The patient is not nervous/anxious.    Objective:  BP 124/72 (BP Location: Left Arm, Patient Position: Sitting, Cuff Size: Large)    Pulse 79    Temp 97.6 F (36.4 C) (Oral)    Ht 5\' 4"  (1.626 m)    Wt 142 lb (64.4 kg)    SpO2 95%    BMI 24.37 kg/m   Physical Exam Vitals reviewed.  HENT:     Nose: Nose normal.     Mouth/Throat:     Mouth: Mucous membranes are moist.     Pharynx: No oropharyngeal exudate.  Eyes:  General: No scleral icterus.    Conjunctiva/sclera: Conjunctivae normal.  Cardiovascular:     Rate and Rhythm: Regular rhythm. Bradycardia present.     Heart sounds: Normal heart sounds, S1 normal and S2 normal. No murmur heard.   No gallop.     Comments: EKG- Sinus bradycardia, 58 bpm TWI in inf/lateral leads  No old EKG's for comparison No LVH or Q waves Pulmonary:     Effort: Pulmonary effort is normal.     Breath sounds: No stridor. No wheezing, rhonchi or rales.  Abdominal:     General: Abdomen is flat. Bowel sounds are  normal. There is no distension or abdominal bruit.     Palpations: Abdomen is soft. There is no hepatomegaly, splenomegaly or mass.     Tenderness: There is no abdominal tenderness.  Musculoskeletal:     Cervical back: Neck supple.     Right lower leg: No edema.     Left lower leg: No edema.  Lymphadenopathy:     Cervical: No cervical adenopathy.  Skin:    General: Skin is warm and dry.     Coloration: Skin is not pale.  Neurological:     General: No focal deficit present.     Mental Status: She is alert. Mental status is at baseline.  Psychiatric:        Mood and Affect: Mood normal.        Behavior: Behavior normal.    Lab Results  Component Value Date   WBC 5.6 11/26/2020   HGB 10.9 (A) 11/26/2020   HCT 33 (A) 11/26/2020   PLT 207 11/26/2020   GLUCOSE 89 03/19/2021   CHOL 138 11/26/2020   TRIG 74 11/26/2020   HDL 54 11/26/2020   LDLCALC 71 11/26/2020   ALT 10 11/26/2020   AST 23 11/26/2020   NA 141 03/19/2021   K 3.8 03/19/2021   CL 107 03/19/2021   CREATININE 1.66 (H) 03/19/2021   BUN 31 (H) 03/19/2021   CO2 26 03/19/2021   TSH 2.11 11/26/2020     Assessment & Plan:   Cheryl [name] was seen today for annual exam and anemia.  Diagnoses and all orders for this visit:  Need for prophylactic vaccination with combined diphtheria-tetanus-pertussis (DTP) vaccine -     Tdap (BOOSTRIX) 5-2.5-18.5 LF-MCG/0.5 injection; Inject 0.5 mLs into the muscle once for 1 dose.  Hypertension due to endocrine disorder- Her blood pressure is adequately well controlled. -     Basic metabolic panel; Future -     Basic metabolic panel  Hypoaldosteronism ([place])- Will continue spironolactone. -     Basic metabolic panel; Future -     Basic metabolic panel  Sarcoidosis- Her calcium level is normal. -     Basic metabolic panel; Future -     Basic metabolic panel  Visit for screening mammogram  Encounter for general adult medical examination with abnormal findings- Exam completed,  labs reviewed, vaccines reviewed and updated, records requested from prior physicians, patient education was given.  Deficiency anemia- Her vitamin levels are normal.  This is likely the anemia of chronic kidney disease. -     Vitamin B12; Future -     IBC + Ferritin; Future -     Reticulocytes; Future -     Vitamin B1; Future -     Zinc; Future -     Hemoglobinopathy Evaluation; Future -     Folate; Future -     Folate -     Hemoglobinopathy Evaluation -  Zinc -     Vitamin B1 -     Reticulocytes -     IBC + Ferritin -     Vitamin B12  Chronic fatigue -     EKG 12-Lead -     Troponin I (High Sensitivity); Future -     Brain natriuretic peptide; Future -     MYOCARDIAL PERFUSION IMAGING; Future -     Cardiac Stress Test: Informed Consent Details: Physician/Practitioner Attestation; Transcribe to consent form and obtain patient signature; Future -     Brain natriuretic peptide -     Troponin I (High Sensitivity)  Abnormal electrocardiogram (ECG) (EKG)- Her only symptom is fatigue but her BNP is slightly elevated.  I recommended that she undergo a myocardial perfusion imaging. -     Troponin I (High Sensitivity); Future -     Brain natriuretic peptide; Future -     Cardiac Stress Test: Informed Consent Details: Physician/Practitioner Attestation; Transcribe to consent form and obtain patient signature; Future -     Brain natriuretic peptide -     Troponin I (High Sensitivity)  Status post endoscopic repair of thoracic aortic aneurysm (TAA)- CTA ordered -     Ambulatory referral to Cardiothoracic Surgery  Hyperlipidemia LDL goal <70- LDL goal achieved. Doing well on the statin   Abnormal electrocardiogram -     MYOCARDIAL PERFUSION IMAGING; Future -     Cardiac Stress Test: Informed Consent Details: Physician/Practitioner Attestation; Transcribe to consent form and obtain patient signature; Future  Chronic renal disease, stage 4, severely decreased glomerular filtration  rate (GFR) between 15-29 mL/min/1.73 square meter ([place]) -     Ambulatory referral to Nephrology   I have discontinued Cheryl [name]'s aspirin and colchicine. I am also having her start on Boostrix. Additionally, I am having her maintain her Rosuvastatin Calcium (CRESTOR PO), Levothyroxine Sodium (SYNTHROID PO), SPIRONOLACTONE PO, METOPROLOL TARTRATE PO, Cholecalciferol (VITAMIN D PO), BIOTIN PO, Omega-3 Fatty Acids (FISH OIL PO), and diclofenac sodium.  Meds ordered this encounter  Medications   Tdap (BOOSTRIX) 5-2.5-18.5 LF-MCG/0.5 injection    Sig: Inject 0.5 mLs into the muscle once for 1 dose.    Dispense:  0.5 mL    Refill:  0     Follow-up: Return in about 3 months (around 06/17/2021).  Scarlette Calico, MD

## 2021-03-23 LAB — RETICULOCYTES
ABS Retic: 43550 cells/uL (ref 20000–80000)
Retic Ct Pct: 1.3 %

## 2021-03-23 LAB — HEMOGLOBINOPATHY EVALUATION
Fetal Hemoglobin Testing: <1 % (ref 0.0–1.9)
HCT: 32.8 % — ABNORMAL LOW (ref 35.0–45.0)
Hemoglobin A2 - HGBRFX: 2.6 % (ref 2.2–3.2)
Hemoglobin: 10.7 g/dL — ABNORMAL LOW (ref 11.7–15.5)
Hgb A: 97.4 %
MCH: 31.1 pg (ref 27.0–33.0)
MCV: 95.3 fL (ref 80.0–100.0)
RBC: 3.44 Million/uL — ABNORMAL LOW (ref 3.80–5.10)
RDW: 12.7 % (ref 11.0–15.0)

## 2021-03-23 LAB — ZINC: Zinc: 65 ug/dL (ref 60–130)

## 2021-03-23 LAB — VITAMIN B1: Vitamin B1 (Thiamine): 6 nmol/L — ABNORMAL LOW (ref 8–30)

## 2021-03-24 ENCOUNTER — Other Ambulatory Visit: Payer: Self-pay | Admitting: Internal Medicine

## 2021-03-24 DIAGNOSIS — D538 Other specified nutritional anemias: Secondary | ICD-10-CM

## 2021-03-24 MED ORDER — THIAMINE HCL 100 MG PO TABS: 100.0000 mg | ORAL_TABLET | Freq: Every day | ORAL | 1 refills | Status: DC

## 2021-03-29 ENCOUNTER — Other Ambulatory Visit: Payer: Self-pay | Admitting: Nephrology

## 2021-03-29 DIAGNOSIS — E2609 Other primary hyperaldosteronism: Secondary | ICD-10-CM

## 2021-03-29 DIAGNOSIS — N1832 Chronic kidney disease, stage 3b: Secondary | ICD-10-CM

## 2021-03-29 DIAGNOSIS — D869 Sarcoidosis, unspecified: Secondary | ICD-10-CM

## 2021-03-29 DIAGNOSIS — I129 Hypertensive chronic kidney disease with stage 1 through stage 4 chronic kidney disease, or unspecified chronic kidney disease: Secondary | ICD-10-CM

## 2021-04-03 ENCOUNTER — Ambulatory Visit (INDEPENDENT_AMBULATORY_CARE_PROVIDER_SITE_OTHER)
Admission: RE | Admit: 2021-04-03 | Discharge: 2021-04-03 | Disposition: A | Discharge status: Home / Self Care | Payer: Medicare HMO | Source: Ambulatory Visit | Attending: Internal Medicine | Admitting: Internal Medicine

## 2021-04-03 ENCOUNTER — Other Ambulatory Visit: Payer: Self-pay

## 2021-04-03 DIAGNOSIS — Z8679 Personal history of other diseases of the circulatory system: Secondary | ICD-10-CM | POA: Diagnosis not present

## 2021-04-03 DIAGNOSIS — Z9889 Other specified postprocedural states: Secondary | ICD-10-CM

## 2021-04-03 MED ORDER — IOHEXOL 350 MG/ML SOLN
50.0000 mL | Freq: Once | INTRAVENOUS | Status: AC | PRN
  Administered 2021-04-03: 50 mL via INTRAVENOUS

## 2021-04-05 ENCOUNTER — Ambulatory Visit
Admission: RE | Admit: 2021-04-05 | Discharge: 2021-04-05 | Disposition: A | Discharge status: Home / Self Care | Payer: Medicare HMO | Source: Ambulatory Visit | Attending: Nephrology | Admitting: Nephrology

## 2021-04-05 DIAGNOSIS — N1832 Chronic kidney disease, stage 3b: Secondary | ICD-10-CM

## 2021-04-05 DIAGNOSIS — I129 Hypertensive chronic kidney disease with stage 1 through stage 4 chronic kidney disease, or unspecified chronic kidney disease: Secondary | ICD-10-CM

## 2021-04-05 DIAGNOSIS — D869 Sarcoidosis, unspecified: Secondary | ICD-10-CM

## 2021-04-05 DIAGNOSIS — E2609 Other primary hyperaldosteronism: Secondary | ICD-10-CM

## 2021-04-10 ENCOUNTER — Other Ambulatory Visit: Payer: Self-pay

## 2021-04-10 ENCOUNTER — Ambulatory Visit: Payer: Medicare HMO | Admitting: Surgery

## 2021-04-10 ENCOUNTER — Encounter: Payer: Self-pay | Admitting: Surgery

## 2021-04-10 VITALS — BP 119/76 | HR 60 | Resp 20 | Ht 64.0 in | Wt 142.0 lb

## 2021-04-10 DIAGNOSIS — Z9889 Other specified postprocedural states: Secondary | ICD-10-CM

## 2021-04-10 DIAGNOSIS — Z8679 Personal history of other diseases of the circulatory system: Secondary | ICD-10-CM | POA: Diagnosis not present

## 2021-04-10 NOTE — Progress Notes (Signed)
HPI:  The patient is a 71 year old woman who underwent repair of a saccular descending thoracic aortic aneurysm using a Dacron tube graft through a left thoracotomy incision by me in April 2001.  She had an uneventful postoperative course.  She was followed for a while by vascular surgery with a small abdominal aortic aneurysm.  The last office visit I can find there was from May 2012.  She had a CTA of the chest on 04/03/2021 that was ordered by her PCP.  This showed mild dilation of the ascending aorta with a maximum diameter of 3.6 x 3.7 cm.  The descending aorta measured 2.6 cm at the level of the main pulmonary artery.  The area of her previous repair looked normal.  There is also a 1.6 x 1.0 x 1.6 cm nodule at the inferior aspect of the left adrenal gland.  Subsequent renal ultrasound showed small echogenic kidneys and a 3.1 cm simple cyst in the lower pole of left kidney.  She has stage IV chronic kidney disease with a creatinine of 1.66.  She continues to feel well without chest pain or shortness of breath.  She has had fatigue of unclear etiology.  She denies any peripheral edema.  She has had occasional episodes of dizziness.  Current Outpatient Medications  Medication Sig Dispense Refill   alendronate (FOSAMAX) 10 MG tablet Take 70 mg by mouth once a week. Take with a full glass of water on an empty stomach.     bimatoprost (LUMIGAN) 0.01 % SOLN at bedtime.     BIOTIN PO Take by mouth.     Cholecalciferol (VITAMIN D PO) Take by mouth.     Levothyroxine Sodium (SYNTHROID PO) Take by mouth.     METOPROLOL TARTRATE PO Take by mouth.     Omega-3 Fatty Acids (FISH OIL PO) Take by mouth.     OVER THE COUNTER MEDICATION [facility]     Rosuvastatin Calcium (CRESTOR PO) Take by mouth.     SPIRONOLACTONE PO Take by mouth.     diclofenac sodium (VOLTAREN) 1 % GEL Apply topically 4 (four) times daily. (Patient not taking: Reported on 04/10/2021)     thiamine 100 MG tablet Take 1 tablet (100 mg  total) by mouth daily. (Patient not taking: Reported on 04/10/2021) 90 tablet 1   No current facility-administered medications for this visit.     Physical Exam: BP 119/76 (BP Location: Right Arm, Patient Position: Sitting)    Pulse 60    Resp 20    Ht 5\' 4"  (1.626 m)    Wt 142 lb (64.4 kg)    SpO2 100% Comment: RA   BMI 24.37 kg/m  She looks well. Cardiac exam shows regular rate and rhythm with normal heart sounds.  There is no murmur. Lungs are clear. There is no peripheral edema.  Diagnostic Tests:  Narrative & Impression  CLINICAL DATA:  Thoracic aortic disease, postoperative. Remote history of endoscopic repair of thoracic aortic aneurysm 20 years ago.   EXAM: CT ANGIOGRAPHY CHEST WITH CONTRAST   TECHNIQUE: Multidetector CT imaging of the chest was performed using the standard protocol during bolus administration of intravenous contrast. Multiplanar CT image reconstructions and MIPs were obtained to evaluate the vascular anatomy.   RADIATION DOSE REDUCTION: This exam was performed according to the departmental dose-optimization program which includes automated exposure control, adjustment of the mA and/or kV according to patient size and/or use of iterative reconstruction technique.   CONTRAST:  40mL OMNIPAQUE IOHEXOL 350  MG/ML SOLN. Decreased dose due to GFR of 31.   COMPARISON:  Chest two views 07/06/2008   FINDINGS: Cardiovascular: Heart size is moderately enlarged. No pericardial effusion. Dense coronary artery calcifications are seen.   Mild atherosclerotic calcification within the aortic root. The ascending aorta measures up to 3.6 x 3.7 cm (transverse by AP) ectatic but not aneurysmal. Moderate calcifications within the aortic arch and descending thoracic aorta. The aortic arch is normal in caliber. The descending thoracic aorta measures 2.6 cm of the level of the main pulmonary artery (as measured on coronal series 7, image 81). There are mild areas of  indentation within the aortic wall consistent with prior postsurgical change within the ascending aorta and the proximal descending aorta. No aortic dissection is seen. Moderate atherosclerotic calcifications around the origin of the celiac axis and superior mesenteric artery.   The main pulmonary artery is only minimally opacified, limiting evaluation for a pulmonary embolism.   Mediastinum/Nodes: No axillary, mediastinal or hilar pathologically enlarged lymph nodes by CT criteria. As suggested on prior radiographs, there are calcified mediastinal and hilar lymph nodes that are likely the sequela of remote granulomatous infection. The visualized thyroid is grossly unremarkable. The esophagus follows a normal course of normal caliber.   Lungs/Pleura: The central airways are patent. Mild bilateral posterior dependent interlobular septal thickening, possible mild scarring versus subsegmental atelectasis. No pleural effusion or pneumothorax.   Upper Abdomen: Normal right adrenal gland. There is a 1.6 by 1.0 by 1.6 cm (transverse by AP by craniocaudal) nodule at the inferior aspect of the lateral limb of the left adrenal gland (axial series 4, image 93). This demonstrates internal density of 78 Hounsfield units and is nonspecific.   Musculoskeletal: Mild-to-moderate multilevel degenerative disc changes of the thoracic spine. No aggressive lytic or blastic osseous lesion is seen.   Review of the MIP images confirms the above findings.   IMPRESSION:: IMPRESSION: 1. Ectatic ascending aorta measuring up to 3.7 cm. No thoracic aortic aneurysm. 2. Moderate cardiomegaly. Coronary artery calcifications indicate coronary artery disease. 3. Redemonstration of calcified mediastinal and hilar lymph nodes, likely the sequela of prior remote granulomatous infection. 4. Left adrenal nodule measuring up to 1.6 cm that does not qualify as a benign lipid rich adenoma by density criteria.  Correlation with a prior CT of the chest or abdomen would be helpful to assess for potential long-term stability which would indicate this is benign. If no prior cross-sectional imaging is available for comparison, a CT of the abdomen adrenal washout protocol may be considered for further evaluation, if clinically indicated.     Electronically Signed   By: Yvonne Kendall M.D.   On: 04/04/2021 10:56    Impression:  This 71 year old woman has an ectatic ascending aorta measuring 3.7 cm which is really below the threshold to call it in aneurysm.  Her descending aorta measures 2.6 cm.  I reviewed the CT images with her and answered all of her questions.  I stressed the importance of good blood pressure control in preventing further enlargement and acute aortic dissection.  Her descending aorta looks unremarkable in the area of her previous aneurysm repair.  She is 22 years after surgical repair and I would expect this to continue to be stable at this point.  She does have some fatigue and dizziness that is being worked up by Dr. Ronnald Ramp and she is apparently being set up for a stress test.  It may also be worthwhile doing a 2D echo to assess her  LV function.  I do not hear any murmurs to suggest significant valvular disease.  Plan:  She will continue to follow-up with her PCP, Dr. Ronnald Ramp, and I will be happy to see her back if the need arises.  I think her aorta can be followed every few years with a CT scan of the chest without contrast given her degree of kidney disease.  I spent 20 minutes performing this established patient evaluation and > 50% of this time was spent face to face counseling and coordinating the surveillance of this patient's aorta status post repair of descending thoracic aortic aneurysm in 2001.   Gaye Pollack, MD [facility] [phone]

## 2021-06-19 ENCOUNTER — Encounter: Payer: Self-pay | Admitting: Internal Medicine

## 2021-06-19 ENCOUNTER — Ambulatory Visit (INDEPENDENT_AMBULATORY_CARE_PROVIDER_SITE_OTHER): Payer: Medicare HMO | Admitting: Internal Medicine

## 2021-06-19 VITALS — BP 122/62 | HR 65 | Temp 98.2°F | Ht 64.0 in | Wt 140.0 lb

## 2021-06-19 DIAGNOSIS — E519 Thiamine deficiency, unspecified: Secondary | ICD-10-CM | POA: Diagnosis not present

## 2021-06-19 DIAGNOSIS — E2740 Unspecified adrenocortical insufficiency: Secondary | ICD-10-CM | POA: Diagnosis not present

## 2021-06-19 DIAGNOSIS — D869 Sarcoidosis, unspecified: Secondary | ICD-10-CM | POA: Diagnosis not present

## 2021-06-19 DIAGNOSIS — D538 Other specified nutritional anemias: Secondary | ICD-10-CM

## 2021-06-19 DIAGNOSIS — N184 Chronic kidney disease, stage 4 (severe): Secondary | ICD-10-CM

## 2021-06-19 DIAGNOSIS — I152 Hypertension secondary to endocrine disorders: Secondary | ICD-10-CM

## 2021-06-19 LAB — CBC WITH DIFFERENTIAL/PLATELET
Basophils Absolute: 0 10*3/uL (ref 0.0–0.1)
Basophils Relative: 0.5 % (ref 0.0–3.0)
Eosinophils Absolute: 0.1 10*3/uL (ref 0.0–0.7)
Eosinophils Relative: 2.3 % (ref 0.0–5.0)
HCT: 32.2 % — ABNORMAL LOW (ref 36.0–46.0)
Hemoglobin: 10.7 g/dL — ABNORMAL LOW (ref 12.0–15.0)
Lymphocytes Relative: 31.6 % (ref 12.0–46.0)
Lymphs Abs: 1.9 10*3/uL (ref 0.7–4.0)
MCHC: 33.2 g/dL (ref 30.0–36.0)
MCV: 94.7 fl (ref 78.0–100.0)
Monocytes Absolute: 0.4 10*3/uL (ref 0.1–1.0)
Monocytes Relative: 7.3 % (ref 3.0–12.0)
Neutro Abs: 3.5 10*3/uL (ref 1.4–7.7)
Neutrophils Relative %: 58.3 % (ref 43.0–77.0)
Platelets: 224 10*3/uL (ref 150.0–400.0)
RBC: 3.41 Mil/uL — ABNORMAL LOW (ref 3.87–5.11)
RDW: 14 % (ref 11.5–15.5)
WBC: 6 10*3/uL (ref 4.0–10.5)

## 2021-06-19 LAB — BASIC METABOLIC PANEL
BUN: 34 mg/dL — ABNORMAL HIGH (ref 6–23)
CO2: 27 mEq/L (ref 19–32)
Calcium: 9.7 mg/dL (ref 8.4–10.5)
Chloride: 106 mEq/L (ref 96–112)
Creatinine, Ser: 1.86 mg/dL — ABNORMAL HIGH (ref 0.40–1.20)
GFR: 26.62 mL/min — ABNORMAL LOW (ref >60.00)
Glucose, Bld: 94 mg/dL (ref 70–99)
Potassium: 4.4 mEq/L (ref 3.5–5.1)
Sodium: 140 mEq/L (ref 135–145)

## 2021-06-19 LAB — TSH: TSH: 1.01 u[IU]/mL (ref 0.35–5.50)

## 2021-06-19 NOTE — Patient Instructions (Signed)
Anemia ? ?Anemia is a condition in which there is not enough red blood cells or hemoglobin in the blood. Hemoglobin is a substance in red blood cells that carries oxygen. ?When you do not have enough red blood cells or hemoglobin (are anemic), your body cannot get enough oxygen and your organs may not work properly. As a result, you may feel very tired or have other problems. ?What are the causes? ?Common causes of anemia include: ?Excessive bleeding. Anemia can be caused by excessive bleeding inside or outside the body, including bleeding from the intestines or from heavy menstrual periods in females. ?Poor nutrition. ?Long-lasting (chronic) kidney, thyroid, and liver disease. ?Bone marrow disorders, spleen problems, and blood disorders. ?Cancer and treatments for cancer. ?HIV (human immunodeficiency virus) and AIDS (acquired immunodeficiency syndrome). ?Infections, medicines, and autoimmune disorders that destroy red blood cells. ?What are the signs or symptoms? ?Symptoms of this condition include: ?Minor weakness. ?Dizziness. ?Headache, or difficulties concentrating and sleeping. ?Heartbeats that feel irregular or faster than normal (palpitations). ?Shortness of breath, especially with exercise. ?Pale skin, lips, and nails, or cold hands and feet. ?Indigestion and nausea. ?Symptoms may occur suddenly or develop slowly. If your anemia is mild, you may not have symptoms. ?How is this diagnosed? ?This condition is diagnosed based on blood tests, your medical history, and a physical exam. In some cases, a test may be needed in which cells are removed from the soft tissue inside of a bone and looked at under a microscope (bone marrow biopsy). Your health care provider may also check your stool (feces) for blood and may do additional testing to look for the cause of your bleeding. ?Other tests may include: ?Imaging tests, such as a CT scan or MRI. ?A procedure to see inside your esophagus and stomach (endoscopy). ?A  procedure to see inside your colon and rectum (colonoscopy). ?How is this treated? ?Treatment for this condition depends on the cause. If you continue to lose a lot of blood, you may need to be treated at a hospital. Treatment may include: ?Taking supplements of iron, vitamin Z30, or folic acid. ?Taking a hormone medicine (erythropoietin) that can help to stimulate red blood cell growth. ?Having a blood transfusion. This may be needed if you lose a lot of blood. ?Making changes to your diet. ?Having surgery to remove your spleen. ?Follow these instructions at home: ?Take over-the-counter and prescription medicines only as told by your health care provider. ?Take supplements only as told by your health care provider. ?Follow any diet instructions that you were given by your health care provider. ?Keep all follow-up visits as told by your health care provider. This is important. ?Contact a health care provider if: ?You develop new bleeding anywhere in the body. ?Get help right away if: ?You are very weak. ?You are short of breath. ?You have pain in your abdomen or chest. ?You are dizzy or feel faint. ?You have trouble concentrating. ?You have bloody stools, black stools, or tarry stools. ?You vomit repeatedly or you vomit up blood. ?These symptoms may represent a serious problem that is an emergency. Do not wait to see if the symptoms will go away. Get medical help right away. Call your local emergency services (911 in the [place] Do not drive yourself to the hospital. ?Summary ?Anemia is a condition in which you do not have enough red blood cells or enough of a substance in your red blood cells that carries oxygen (hemoglobin). ?Symptoms may occur suddenly or develop slowly. ?If your anemia  is mild, you may not have symptoms. ?This condition is diagnosed with blood tests, a medical history, and a physical exam. Other tests may be needed. ?Treatment for this condition depends on the cause of the anemia. ?This  information is not intended to replace advice given to you by your health care provider. Make sure you discuss any questions you have with your health care provider. ?Document Revised: 12/25/2020 Document Reviewed: 01/18/2019 ?Elsevier Patient Education ? [employer]. ? ?

## 2021-06-19 NOTE — Progress Notes (Signed)
? ?Subjective:  ?Patient ID: Cheryl [name], female    DOB: 1950-07-19  Age: 71 y.o. MRN: [id number] ? ?CC: Hypertension and Anemia ? ? ?HPI ?Cheryl [name] presents for f/up - ? ?She continues to complain of chronic fatigue.  She is active and denies chest pain, shortness of breath, diaphoresis cough, edema, or dyspnea on exertion.  She is taking the thiamine supplement. ? ?Outpatient Medications Prior to Visit  ?Medication Sig Dispense Refill  ? alendronate (FOSAMAX) 10 MG tablet Take 70 mg by mouth once a week. Take with a full glass of water on an empty stomach.    ? bimatoprost (LUMIGAN) 0.01 % SOLN at bedtime.    ? BIOTIN PO Take by mouth.    ? Cholecalciferol (VITAMIN D PO) Take by mouth.    ? diclofenac sodium (VOLTAREN) 1 % GEL Apply topically 4 (four) times daily.    ? Levothyroxine Sodium (SYNTHROID PO) Take by mouth.    ? METOPROLOL TARTRATE PO Take by mouth.    ? Omega-3 Fatty Acids (FISH OIL PO) Take by mouth.    ? OVER THE COUNTER MEDICATION Therawork    ? Rosuvastatin Calcium (CRESTOR PO) Take by mouth.    ? SPIRONOLACTONE PO Take by mouth.    ? thiamine 100 MG tablet Take 1 tablet (100 mg total) by mouth daily. 90 tablet 1  ? ?No facility-administered medications prior to visit.  ? ? ?ROS ?Review of Systems  ?Constitutional:  Positive for fatigue. Negative for appetite change, chills, diaphoresis and unexpected weight change.  ?Eyes: Negative.   ?Respiratory:  Negative for cough, chest tightness, shortness of breath and wheezing.   ?Cardiovascular:  Negative for chest pain, palpitations and leg swelling.  ?Gastrointestinal:  Positive for constipation. Negative for anal bleeding and nausea.  ?Endocrine: Negative.   ?Genitourinary: Negative.  Negative for difficulty urinating.  ?Musculoskeletal: Negative.  Negative for arthralgias and myalgias.  ?Skin: Negative.   ?Neurological:  Negative for dizziness, weakness and light-headedness.  ?Hematological:  Negative for adenopathy. Does not bruise/bleed  easily.  ?Psychiatric/Behavioral: Negative.    ? ?Objective:  ?BP 122/62 (BP Location: Right Arm, Patient Position: Sitting, Cuff Size: Large)   Pulse 65   Temp 98.2 ?F (36.8 ?C) (Oral)   Ht 5\' 4"  (1.626 m)   Wt 140 lb (63.5 kg)   SpO2 98%   BMI 24.03 kg/m?  ? ?BP Readings from Last 3 Encounters:  ?06/19/21 122/62  ?04/10/21 119/76  ?03/19/21 124/72  ? ? ?Wt Readings from Last 3 Encounters:  ?06/19/21 140 lb (63.5 kg)  ?04/10/21 142 lb (64.4 kg)  ?03/19/21 142 lb (64.4 kg)  ? ? ?Physical Exam ?Vitals reviewed.  ?HENT:  ?   Nose: Nose normal.  ?   Mouth/Throat:  ?   Mouth: Mucous membranes are moist.  ?Eyes:  ?   General: No scleral icterus. ?   Conjunctiva/sclera: Conjunctivae normal.  ?Cardiovascular:  ?   Rate and Rhythm: Normal rate and regular rhythm.  ?   Heart sounds: No murmur heard. ?Pulmonary:  ?   Effort: Pulmonary effort is normal.  ?   Breath sounds: No stridor. No wheezing, rhonchi or rales.  ?Abdominal:  ?   General: Abdomen is flat.  ?   Palpations: There is no mass.  ?   Tenderness: There is no abdominal tenderness. There is no guarding or rebound.  ?   Hernia: No hernia is present.  ?Musculoskeletal:     ?   General: Normal range of motion.  ?  Cervical back: Neck supple.  ?   Right lower leg: No edema.  ?   Left lower leg: No edema.  ?Lymphadenopathy:  ?   Cervical: No cervical adenopathy.  ?Skin: ?   General: Skin is warm and dry.  ?Neurological:  ?   General: No focal deficit present.  ?Psychiatric:     ?   Mood and Affect: Mood normal.     ?   Behavior: Behavior normal.  ? ? ?Lab Results  ?Component Value Date  ? WBC 6.0 06/19/2021  ? HGB 10.7 (L) 06/19/2021  ? HCT 32.2 (L) 06/19/2021  ? PLT 224.0 06/19/2021  ? GLUCOSE 94 06/19/2021  ? CHOL 138 11/26/2020  ? TRIG 74 11/26/2020  ? HDL 54 11/26/2020  ? [place] 71 11/26/2020  ? ALT 10 11/26/2020  ? AST 23 11/26/2020  ? NA 140 06/19/2021  ? K 4.4 06/19/2021  ? CL 106 06/19/2021  ? CREATININE 1.86 (H) 06/19/2021  ? BUN 34 (H) 06/19/2021  ? CO2  27 06/19/2021  ? TSH 1.01 06/19/2021  ? ? ?US RENAL ? ?Result Date: 04/08/2021 ?CLINICAL DATA:  Stage III B chronic kidney disease; sarcoidosis EXAM: RENAL / URINARY TRACT ULTRASOUND COMPLETE COMPARISON:  None. FINDINGS: Right Kidney: Renal measurements: 7.8 x 4.0 x 3.7 cm = volume: 60 mL. Diffusely echogenic renal parenchyma. Increased conspicuity of the corticomedullary interface. No discrete lesion. No hydronephrosis. Left Kidney: Renal measurements: 10.7 x 4.7 x 4.2 cm = volume: 112 mL. Diffusely echogenic renal parenchyma. Circumscribed anechoic simple cyst within imperceptible wall arising at the lower pole measures 3.1 x 2.2 x 2.7 cm. No hydronephrosis or solid nodule. Bladder: Appears normal for degree of bladder distention. Other: None. IMPRESSION: Small, echogenic kidneys consistent with underlying medical renal disease. No evidence of hydronephrosis. 3.1 cm simple cyst in the lower pole of the left kidney. Electronically Signed   By: Jacqulynn Cadet M.D.   On: 04/08/2021 07:36  ? ? ?Assessment & Plan:  ? ?Celesta was seen today for hypertension and anemia. ? ?Diagnoses and all orders for this visit: ? ?Anemia due to acquired thiamine deficiency ?-     CBC with Differential/Platelet; Future ?-     CBC with Differential/Platelet ? ?Hypertension due to endocrine disorder- Her BP is well controlled. ?-     Basic metabolic panel; Future ?-     TSH; Future ?-     TSH ?-     Basic metabolic panel ? ?Hypoaldosteronism ([place])- Her BP and lytes are normal. ?-     Basic metabolic panel; Future ?-     Basic metabolic panel ? ?Chronic renal disease, stage 4, severely decreased glomerular filtration rate (GFR) between 15-29 mL/min/1.73 square meter (HCC)- Her renal fxn has declined and I think this is contributing to the anemia. ?-     Basic metabolic panel; Future ?-     Basic metabolic panel ?-     Ambulatory referral to Nephrology ? ?Sarcoidosis- NED ?-     Basic metabolic panel; Future ?-     Basic metabolic  panel ? ? ?I am having Emilyanne Allman maintain her Rosuvastatin Calcium (CRESTOR PO), Levothyroxine Sodium (SYNTHROID PO), SPIRONOLACTONE PO, METOPROLOL TARTRATE PO, Cholecalciferol (VITAMIN D PO), BIOTIN PO, Omega-3 Fatty Acids (FISH OIL PO), diclofenac sodium, thiamine, bimatoprost, alendronate, and OVER THE COUNTER MEDICATION. ? ?No orders of the defined types were placed in this encounter. ? ? ? ?Follow-up: Return in about 6 months (around 12/19/2021). ? ?Scarlette Calico, MD ?

## 2021-07-29 ENCOUNTER — Ambulatory Visit (INDEPENDENT_AMBULATORY_CARE_PROVIDER_SITE_OTHER): Payer: Medicare HMO

## 2021-07-29 DIAGNOSIS — Z0000 Encounter for general adult medical examination without abnormal findings: Secondary | ICD-10-CM

## 2021-07-29 DIAGNOSIS — Z780 Asymptomatic menopausal state: Secondary | ICD-10-CM | POA: Diagnosis not present

## 2021-07-29 NOTE — Progress Notes (Signed)
Subjective:   Avyonna Wagoner is a 71 y.o. female who presents for an Initial Medicare Annual Wellness Visit.   I connected with Tyffani Gille today by telephone and verified that I am speaking with the correct person using two identifiers. Location patient: home Location provider: work Persons participating in the virtual visit: patient, provider.   I discussed the limitations, risks, security and privacy concerns of performing an evaluation and management service by telephone and the availability of in person appointments. I also discussed with the patient that there may be a patient responsible charge related to this service. The patient expressed understanding and verbally consented to this telephonic visit.    Interactive audio and video telecommunications were attempted between this provider and patient, however failed, due to patient having technical difficulties OR patient did not have access to video capability.  We continued and completed visit with audio only.    Review of Systems     Cardiac Risk Factors include: advanced age (>59men, >54 women)     Objective:    Today's Vitals   There is no height or weight on file to calculate BMI.     07/29/2021   10:15 AM 10/09/2016   10:32 AM 09/29/2016   12:57 PM 09/22/2016    1:05 PM 09/11/2016   11:16 AM 09/04/2016   11:21 AM 08/25/2016    1:09 PM  Advanced Directives  Does Patient Have a Medical Advance Directive? No No No No No No No  Would patient like information on creating a medical advance directive? No - Patient declined Yes (MAU/Ambulatory/Procedural Areas - Information given) Yes (MAU/Ambulatory/Procedural Areas - Information given) Yes (MAU/Ambulatory/Procedural Areas - Information given) Yes (MAU/Ambulatory/Procedural Areas - Information given) Yes (MAU/Ambulatory/Procedural Areas - Information given) Yes (MAU/Ambulatory/Procedural Areas - Information given)    Current Medications (verified) Outpatient Encounter  Medications as of 07/29/2021  Medication Sig   alendronate (FOSAMAX) 10 MG tablet Take 70 mg by mouth once a week. Take with a full glass of water on an empty stomach.   bimatoprost (LUMIGAN) 0.01 % SOLN at bedtime.   BIOTIN PO Take by mouth.   Cholecalciferol (VITAMIN D PO) Take by mouth.   diclofenac sodium (VOLTAREN) 1 % GEL Apply topically 4 (four) times daily.   Levothyroxine Sodium (SYNTHROID PO) Take by mouth.   METOPROLOL TARTRATE PO Take by mouth.   montelukast (SINGULAIR) 10 MG tablet Take 10 mg by mouth at bedtime.   Omega-3 Fatty Acids (FISH OIL PO) Take by mouth.   OVER THE COUNTER MEDICATION Therawork   Rosuvastatin Calcium (CRESTOR PO) Take by mouth.   SPIRONOLACTONE PO Take by mouth.   thiamine 100 MG tablet Take 1 tablet (100 mg total) by mouth daily.   No facility-administered encounter medications on file as of 07/29/2021.    Allergies (verified) Erythromycin and Penicillins   History: Past Medical History:  Diagnosis Date   AAA (abdominal aortic aneurysm) (HCC)    Arthritis    osteoarthritis   Hyperlipidemia    Hypertension    Hypoaldosteronism ([place])    Renal disorder    renal insufficiency   Sarcoidosis    Thyroid disease    hypothyroidism   Past Surgical History:  Procedure Laterality Date   ABDOMINAL AORTIC ANEURYSM REPAIR     BREAST BIOPSY     RENAL BIOPSY     renal vein sampling     History reviewed. No pertinent family history. Social History   Socioeconomic History   Marital status: Married  Spouse name: Not on file   Number of children: Not on file   Years of education: Not on file   Highest education level: Not on file  Occupational History   Not on file  Tobacco Use   Smoking status: Never   Smokeless tobacco: Not on file  Substance and Sexual Activity   Alcohol use: No   Drug use: No   Sexual activity: Not on file  Other Topics Concern   Not on file  Social History Narrative   Not on file   Social Determinants of Health    Financial Resource Strain: Low Risk    Difficulty of Paying Living Expenses: Not hard at all  Food Insecurity: No Food Insecurity   Worried About [place] in the Last Year: Never true   [place] in the Last Year: Never true  Transportation Needs: No Transportation Needs   Lack of Transportation (Medical): No   Lack of Transportation (Non-Medical): No  Physical Activity: Inactive   Days of Exercise per Week: 0 days   Minutes of Exercise per Session: 0 min  Stress: No Stress Concern Present   Feeling of Stress : Not at all  Social Connections: Moderately Integrated   Frequency of Communication with Friends and Family: Twice a week   Frequency of Social Gatherings with Friends and Family: Twice a week   Attends Religious Services: More than 4 times per year   Active Member of [employer] or Organizations: No   Attends Music therapist: Never   Marital Status: Married    Tobacco Counseling Counseling given: Not Answered   Clinical Intake:  Pre-visit preparation completed: Yes  Pain : No/denies pain     Nutritional Risks: None Diabetes: No  How often do you need to have someone help you when you read instructions, pamphlets, or other written materials from your doctor or pharmacy?: 1 - Never What is the last grade level you completed in school?: college  Diabetic?no   Interpreter Needed?: No  Information entered by :: [facility] [place] [facility] Daily Living    07/29/2021   10:22 AM  In your present state of health, do you have any difficulty performing the following activities:  Hearing? 0  Vision? 0  Difficulty concentrating or making decisions? 0  Walking or climbing stairs? 0  Dressing or bathing? 0  Doing errands, shopping? 0  Preparing Food and eating ? N  Using the Toilet? N  In the past six months, have you accidently leaked urine? N  Do you have problems with loss of bowel control? N  Managing your Medications? N  Managing  your Finances? N  Housekeeping or managing your Housekeeping? N    Patient Care Team: Janith Lima, MD as PCP - General (Internal Medicine)  Indicate any recent Medical Services you may have received from other than [facility] providers in the past year (date may be approximate).     Assessment:   This is a routine wellness examination for [place].  Hearing/Vision screen Vision Screening - Comments:: Annual eye exams wear glasses   Dietary issues and exercise activities discussed: Current Exercise Habits: The patient does not participate in regular exercise at present, Exercise limited by: None identified   Goals Addressed   None    Depression Screen    07/29/2021   10:15 AM 07/29/2021   10:14 AM 03/19/2021    8:57 AM  PHQ 2/9 Scores  PHQ - 2 Score 0 0  1    Fall Risk    07/29/2021   10:15 AM  Fall Risk   Falls in the past year? 0  Number falls in past yr: 0  Injury with Fall? 0  Follow up Falls evaluation completed    [place]:  Any stairs in or around the home? Yes  If so, are there any without handrails? No  Home free of loose throw rugs in walkways, pet beds, electrical cords, etc? Yes  Adequate lighting in your home to reduce risk of falls? Yes   ASSISTIVE DEVICES UTILIZED TO PREVENT FALLS:  Life alert? No  Use of a cane, walker or w/c? No  Grab bars in the bathroom? Yes  Shower chair or bench in shower? Yes  Elevated toilet seat or a handicapped toilet? Yes     Cognitive Function:  Normal cognitive status assessed by telephone conversation by this Nurse Health Advisor. No abnormalities found.        Immunizations Immunization History  Administered Date(s) Administered   Medtronic Booster Vaccination 01/12/2020   Moderna Sars-Covid-2 Vaccination 04/02/2019, 05/03/2019, 01/12/2020   Pneumococcal Conjugate-13 11/08/2014   Pneumococcal Polysaccharide-23 01/04/2018   Zoster Recombinat (Shingrix) 11/23/2017,  03/26/2018    TDAP status: Due, Education has been provided regarding the importance of this vaccine. Advised may receive this vaccine at local pharmacy or Health Dept. Aware to provide a copy of the vaccination record if obtained from local pharmacy or Health Dept. Verbalized acceptance and understanding.  Flu Vaccine status: Up to date  Pneumococcal vaccine status: Up to date  Covid-19 vaccine status: Completed vaccines  Qualifies for Shingles Vaccine? Yes   Zostavax completed Yes   Shingrix Completed?: Yes  Screening Tests Health Maintenance  Topic Date Due   Hepatitis C Screening  Never done   TETANUS/TDAP  Never done   COLONOSCOPY (Pts 45-9yrs Insurance coverage will need to be confirmed)  Never done   DEXA SCAN  Never done   INFLUENZA VACCINE  09/24/2021   MAMMOGRAM  05/10/2022   Pneumonia Vaccine 52+ Years old  Completed   COVID-19 Vaccine  Completed   Zoster Vaccines- Shingrix  Completed   HPV VACCINES  Aged Out    Health Maintenance  Health Maintenance Due  Topic Date Due   Hepatitis C Screening  Never done   TETANUS/TDAP  Never done   COLONOSCOPY (Pts 45-26yrs Insurance coverage will need to be confirmed)  Never done   DEXA SCAN  Never done    Colorectal cancer screening: Type of screening: Cologuard. Completed 05/15/2019. Repeat every 3 years  Mammogram status: Ordered 03/20/2021. Pt provided with contact info and advised to call to schedule appt.   Bone Density status: Ordered 07/29/2021. Pt provided with contact info and advised to call to schedule appt.  Lung Cancer Screening: (Low Dose CT Chest recommended if Age 43-80 years, 30 pack-year currently smoking OR have quit w/in 15years.) does not qualify.   Lung Cancer Screening Referral: n/a  Additional Screening:  Hepatitis C Screening: does not qualify;   Vision Screening: Recommended annual ophthalmology exams for early detection of glaucoma and other disorders of the eye. Is the patient up to  date with their annual eye exam?  Yes  Who is the provider or what is the name of the office in which the patient attends annual eye exams? Dr.Groat  If pt is not established with a provider, would they like to be referred to a provider to establish care?  No .   Dental Screening : Recommended annual dental exams for proper oral hygiene  Community Resource Referral / Chronic Care Management: CRR required this visit?  No   CCM required this visit?  No      Plan:     I have personally reviewed and noted the following in the patient's chart:   Medical and social history Use of alcohol, tobacco or illicit drugs  Current medications and supplements including opioid prescriptions. Patient is not currently taking opioid prescriptions. Functional ability and status Nutritional status Physical activity Advanced directives List of other physicians Hospitalizations, surgeries, and ER visits in previous 12 months Vitals Screenings to include cognitive, depression, and falls Referrals and appointments  In addition, I have reviewed and discussed with patient certain preventive protocols, quality metrics, and best practice recommendations. A written personalized care plan for preventive services as well as general preventive health recommendations were provided to patient.     Randel Pigg, LPN   08/27/3446   Nurse Notes: none

## 2021-07-29 NOTE — Patient Instructions (Signed)
Cheryl [name] , Thank you for taking time to come for your Medicare Wellness Visit. I appreciate your ongoing commitment to your health goals. Please review the following plan we discussed and let me know if I can assist you in the future.   Screening recommendations/referrals: Colonoscopy: Cologuard 05/15/2019 Mammogram: ordered 03/20/2021 Bone Density: ordered 07/29/2021 Recommended yearly ophthalmology/optometry visit for glaucoma screening and checkup Recommended yearly dental visit for hygiene and checkup  Vaccinations: Influenza vaccine: completed  Pneumococcal vaccine: completed  Tdap vaccine: due  Shingles vaccine: completed     Advanced directives: no   Conditions/risks identified: none  Next appointment: none    Preventive Care 42 Years and Older, Female Preventive care refers to lifestyle choices and visits with your health care provider that can promote health and wellness. What does preventive care include? A yearly physical exam. This is also called an annual well check. Dental exams once or twice a year. Routine eye exams. Ask your health care provider how often you should have your eyes checked. Personal lifestyle choices, including: Daily care of your teeth and gums. Regular physical activity. Eating a healthy diet. Avoiding tobacco and drug use. Limiting alcohol use. Practicing safe sex. Taking low-dose aspirin every day. Taking vitamin and mineral supplements as recommended by your health care provider. What happens during an annual well check? The services and screenings done by your health care provider during your annual well check will depend on your age, overall health, lifestyle risk factors, and family history of disease. Counseling  Your health care provider may ask you questions about your: Alcohol use. Tobacco use. Drug use. Emotional well-being. Home and relationship well-being. Sexual activity. Eating habits. History of falls. Memory and  ability to understand (cognition). Work and work Statistician. Reproductive health. Screening  You may have the following tests or measurements: Height, weight, and BMI. Blood pressure. Lipid and cholesterol levels. These may be checked every 5 years, or more frequently if you are over 81 years old. Skin check. Lung cancer screening. You may have this screening every year starting at age 64 if you have a 30-pack-year history of smoking and currently smoke or have quit within the past 15 years. Fecal occult blood test (FOBT) of the stool. You may have this test every year starting at age 10. Flexible sigmoidoscopy or colonoscopy. You may have a sigmoidoscopy every 5 years or a colonoscopy every 10 years starting at age 46. Hepatitis C blood test. Hepatitis B blood test. Sexually transmitted disease (STD) testing. Diabetes screening. This is done by checking your blood sugar (glucose) after you have not eaten for a while (fasting). You may have this done every 1-3 years. Bone density scan. This is done to screen for osteoporosis. You may have this done starting at age 30. Mammogram. This may be done every 1-2 years. Talk to your health care provider about how often you should have regular mammograms. Talk with your health care provider about your test results, treatment options, and if necessary, the need for more tests. Vaccines  Your health care provider may recommend certain vaccines, such as: Influenza vaccine. This is recommended every year. Tetanus, diphtheria, and acellular pertussis (Tdap, Td) vaccine. You may need a Td booster every 10 years. Zoster vaccine. You may need this after age 64. Pneumococcal 13-valent conjugate (PCV13) vaccine. One dose is recommended after age 60. Pneumococcal polysaccharide (PPSV23) vaccine. One dose is recommended after age 96. Talk to your health care provider about which screenings and vaccines you need and  how often you need them. This information is  not intended to replace advice given to you by your health care provider. Make sure you discuss any questions you have with your health care provider. Document Released: 03/09/2015 Document Revised: 10/31/2015 Document Reviewed: 12/12/2014 Elsevier Interactive Patient Education  2017 [place] Prevention in the Home Falls can cause injuries. They can happen to people of all ages. There are many things you can do to make your home safe and to help prevent falls. What can I do on the outside of my home? Regularly fix the edges of walkways and driveways and fix any cracks. Remove anything that might make you trip as you walk through a door, such as a raised step or threshold. Trim any bushes or trees on the path to your home. Use bright outdoor lighting. Clear any walking paths of anything that might make someone trip, such as rocks or tools. Regularly check to see if handrails are loose or broken. Make sure that both sides of any steps have handrails. Any raised decks and porches should have guardrails on the edges. Have any leaves, snow, or ice cleared regularly. Use sand or salt on walking paths during winter. Clean up any spills in your garage right away. This includes oil or grease spills. What can I do in the bathroom? Use night lights. Install grab bars by the toilet and in the tub and shower. Do not use towel bars as grab bars. Use non-skid mats or decals in the tub or shower. If you need to sit down in the shower, use a plastic, non-slip stool. Keep the floor dry. Clean up any water that spills on the floor as soon as it happens. Remove soap buildup in the tub or shower regularly. Attach bath mats securely with double-sided non-slip rug tape. Do not have throw rugs and other things on the floor that can make you trip. What can I do in the bedroom? Use night lights. Make sure that you have a light by your bed that is easy to reach. Do not use any sheets or blankets that  are too big for your bed. They should not hang down onto the floor. Have a firm chair that has side arms. You can use this for support while you get dressed. Do not have throw rugs and other things on the floor that can make you trip. What can I do in the kitchen? Clean up any spills right away. Avoid walking on wet floors. Keep items that you use a lot in easy-to-reach places. If you need to reach something above you, use a strong step stool that has a grab bar. Keep electrical cords out of the way. Do not use floor polish or wax that makes floors slippery. If you must use wax, use non-skid floor wax. Do not have throw rugs and other things on the floor that can make you trip. What can I do with my stairs? Do not leave any items on the stairs. Make sure that there are handrails on both sides of the stairs and use them. Fix handrails that are broken or loose. Make sure that handrails are as long as the stairways. Check any carpeting to make sure that it is firmly attached to the stairs. Fix any carpet that is loose or worn. Avoid having throw rugs at the top or bottom of the stairs. If you do have throw rugs, attach them to the floor with carpet tape. Make sure that you have  a light switch at the top of the stairs and the bottom of the stairs. If you do not have them, ask someone to add them for you. What else can I do to help prevent falls? Wear shoes that: Do not have high heels. Have rubber bottoms. Are comfortable and fit you well. Are closed at the toe. Do not wear sandals. If you use a stepladder: Make sure that it is fully opened. Do not climb a closed stepladder. Make sure that both sides of the stepladder are locked into place. Ask someone to hold it for you, if possible. Clearly mark and make sure that you can see: Any grab bars or handrails. First and last steps. Where the edge of each step is. Use tools that help you move around (mobility aids) if they are needed. These  include: Canes. Walkers. Scooters. Crutches. Turn on the lights when you go into a dark area. Replace any light bulbs as soon as they burn out. Set up your furniture so you have a clear path. Avoid moving your furniture around. If any of your floors are uneven, fix them. If there are any pets around you, be aware of where they are. Review your medicines with your doctor. Some medicines can make you feel dizzy. This can increase your chance of falling. Ask your doctor what other things that you can do to help prevent falls. This information is not intended to replace advice given to you by your health care provider. Make sure you discuss any questions you have with your health care provider. Document Released: 12/07/2008 Document Revised: 07/19/2015 Document Reviewed: 03/17/2014 Elsevier Interactive Patient Education  2017 [employer].

## 2021-12-20 LAB — HM MAMMOGRAPHY

## 2021-12-20 LAB — HM DEXA SCAN

## 2022-01-02 ENCOUNTER — Encounter: Payer: Self-pay | Admitting: Internal Medicine

## 2022-01-02 ENCOUNTER — Ambulatory Visit (INDEPENDENT_AMBULATORY_CARE_PROVIDER_SITE_OTHER): Payer: Medicare HMO | Admitting: Internal Medicine

## 2022-01-02 VITALS — BP 132/72 | HR 71 | Temp 97.9°F | Ht 64.0 in | Wt 135.0 lb

## 2022-01-02 DIAGNOSIS — D538 Other specified nutritional anemias: Secondary | ICD-10-CM | POA: Diagnosis not present

## 2022-01-02 DIAGNOSIS — I152 Hypertension secondary to endocrine disorders: Secondary | ICD-10-CM | POA: Diagnosis not present

## 2022-01-02 DIAGNOSIS — D869 Sarcoidosis, unspecified: Secondary | ICD-10-CM

## 2022-01-02 DIAGNOSIS — E519 Thiamine deficiency, unspecified: Secondary | ICD-10-CM | POA: Diagnosis not present

## 2022-01-02 DIAGNOSIS — E2740 Unspecified adrenocortical insufficiency: Secondary | ICD-10-CM | POA: Diagnosis not present

## 2022-01-02 DIAGNOSIS — E785 Hyperlipidemia, unspecified: Secondary | ICD-10-CM | POA: Diagnosis not present

## 2022-01-02 DIAGNOSIS — N184 Chronic kidney disease, stage 4 (severe): Secondary | ICD-10-CM | POA: Diagnosis not present

## 2022-01-02 LAB — CBC WITH DIFFERENTIAL/PLATELET
Basophils Absolute: 0 10*3/uL (ref 0.0–0.1)
Basophils Relative: 0.6 % (ref 0.0–3.0)
Eosinophils Absolute: 0.4 10*3/uL (ref 0.0–0.7)
Eosinophils Relative: 7 % — ABNORMAL HIGH (ref 0.0–5.0)
HCT: 31.5 % — ABNORMAL LOW (ref 36.0–46.0)
Hemoglobin: 10.5 g/dL — ABNORMAL LOW (ref 12.0–15.0)
Lymphocytes Relative: 32 % (ref 12.0–46.0)
Lymphs Abs: 1.9 10*3/uL (ref 0.7–4.0)
MCHC: 33.2 g/dL (ref 30.0–36.0)
MCV: 94.8 fl (ref 78.0–100.0)
Monocytes Absolute: 0.4 10*3/uL (ref 0.1–1.0)
Monocytes Relative: 6.5 % (ref 3.0–12.0)
Neutro Abs: 3.2 10*3/uL (ref 1.4–7.7)
Neutrophils Relative %: 53.9 % (ref 43.0–77.0)
Platelets: 232 10*3/uL (ref 150.0–400.0)
RBC: 3.32 Mil/uL — ABNORMAL LOW (ref 3.87–5.11)
RDW: 14.1 % (ref 11.5–15.5)
WBC: 5.9 10*3/uL (ref 4.0–10.5)

## 2022-01-02 LAB — BASIC METABOLIC PANEL
BUN: 27 mg/dL — ABNORMAL HIGH (ref 6–23)
CO2: 26 mEq/L (ref 19–32)
Calcium: 10.1 mg/dL (ref 8.4–10.5)
Chloride: 108 mEq/L (ref 96–112)
Creatinine, Ser: 1.81 mg/dL — ABNORMAL HIGH (ref 0.40–1.20)
GFR: 27.4 mL/min — ABNORMAL LOW (ref >60.00)
Glucose, Bld: 88 mg/dL (ref 70–99)
Potassium: 4.1 mEq/L (ref 3.5–5.1)
Sodium: 142 mEq/L (ref 135–145)

## 2022-01-02 LAB — HEPATIC FUNCTION PANEL
ALT: 9 U/L (ref 0–35)
AST: 21 U/L (ref 0–37)
Albumin: 3.7 g/dL (ref 3.5–5.2)
Alkaline Phosphatase: 49 U/L (ref 39–117)
Bilirubin, Direct: 0.1 mg/dL (ref 0.0–0.3)
Total Bilirubin: 0.5 mg/dL (ref 0.2–1.2)
Total Protein: 6.4 g/dL (ref 6.0–8.3)

## 2022-01-02 LAB — LIPID PANEL
Cholesterol: 125 mg/dL (ref 0–200)
HDL: 57.6 mg/dL (ref >39.00)
LDL Cholesterol: 54 mg/dL (ref 0–99)
NonHDL: 67.45
Total CHOL/HDL Ratio: 2
Triglycerides: 65 mg/dL (ref 0.0–149.0)
VLDL: 13 mg/dL (ref 0.0–40.0)

## 2022-01-02 NOTE — Progress Notes (Signed)
Subjective:  Patient ID: Cheryl [name], female    DOB: 03-21-1950  Age: 71 y.o. MRN: [id number]  CC: Hypertension, Anemia, and Hyperlipidemia   HPI Cheryl [name] presents for f/up -   She tells me that she is taking naproxen for chronic low back pain.  She has mild persistent ataxia and orthostatic dizziness.  She has a rare, nonproductive cough.  Outpatient Medications Prior to Visit  Medication Sig Dispense Refill   alendronate (FOSAMAX) 10 MG tablet Take 70 mg by mouth once a week. Take with a full glass of water on an empty stomach.     bimatoprost (LUMIGAN) 0.01 % SOLN at bedtime.     BIOTIN PO Take by mouth.     Cholecalciferol (VITAMIN D PO) Take by mouth.     diclofenac sodium (VOLTAREN) 1 % GEL Apply topically 4 (four) times daily.     Levothyroxine Sodium (SYNTHROID PO) Take by mouth.     METOPROLOL TARTRATE PO Take by mouth.     montelukast (SINGULAIR) 10 MG tablet Take 10 mg by mouth at bedtime.     Omega-3 Fatty Acids (FISH OIL PO) Take by mouth.     OVER THE COUNTER MEDICATION [facility]     Rosuvastatin Calcium (CRESTOR PO) Take by mouth.     SPIRONOLACTONE PO Take by mouth.     thiamine 100 MG tablet Take 1 tablet (100 mg total) by mouth daily. 90 tablet 1   No facility-administered medications prior to visit.    ROS Review of Systems  Constitutional:  Negative for chills, diaphoresis, fatigue and fever.  HENT: Negative.    Eyes: Negative.   Respiratory:  Positive for cough. Negative for chest tightness, shortness of breath and wheezing.   Cardiovascular:  Negative for chest pain, palpitations and leg swelling.  Gastrointestinal:  Negative for abdominal pain, diarrhea, nausea and vomiting.  Endocrine: Negative.   Genitourinary: Negative.  Negative for difficulty urinating.  Musculoskeletal:  Positive for back pain and gait problem. Negative for myalgias and neck pain.  Neurological:  Positive for dizziness and light-headedness. Negative for weakness.   Hematological:  Negative for adenopathy. Does not bruise/bleed easily.  Psychiatric/Behavioral:  Negative for agitation.     Objective:  BP 132/72 (BP Location: Left Arm, Patient Position: Sitting, Cuff Size: Normal)   Pulse 71   Temp 97.9 F (36.6 C) (Oral)   Ht 5\' 4"  (1.626 m)   Wt 135 lb (61.2 kg)   SpO2 97%   BMI 23.17 kg/m   BP Readings from Last 3 Encounters:  01/02/22 132/72  06/19/21 122/62  04/10/21 119/76    Wt Readings from Last 3 Encounters:  01/02/22 135 lb (61.2 kg)  06/19/21 140 lb (63.5 kg)  04/10/21 142 lb (64.4 kg)    Physical Exam Vitals reviewed.  HENT:     Mouth/Throat:     Mouth: Mucous membranes are moist.  Eyes:     General: No scleral icterus.    Pupils: Pupils are equal, round, and reactive to light.  Cardiovascular:     Rate and Rhythm: Normal rate and regular rhythm.     Heart sounds: No murmur heard. Pulmonary:     Breath sounds: No stridor. No wheezing, rhonchi or rales.  Abdominal:     General: Abdomen is flat.     Palpations: There is no mass.     Tenderness: There is no abdominal tenderness. There is no guarding.     Hernia: No hernia is present.  Musculoskeletal:  General: Normal range of motion.     Cervical back: Neck supple.     Right lower leg: No edema.     Left lower leg: No edema.  Lymphadenopathy:     Cervical: No cervical adenopathy.  Skin:    General: Skin is warm and dry.  Neurological:     General: No focal deficit present.     Mental Status: She is alert.  Psychiatric:        Mood and Affect: Mood normal.        Behavior: Behavior normal.     Lab Results  Component Value Date   WBC 5.9 01/02/2022   HGB 10.5 (L) 01/02/2022   HCT 31.5 (L) 01/02/2022   PLT 232.0 01/02/2022   GLUCOSE 88 01/02/2022   CHOL 125 01/02/2022   TRIG 65.0 01/02/2022   HDL 57.60 01/02/2022   LDLCALC 54 01/02/2022   ALT 9 01/02/2022   AST 21 01/02/2022   NA 142 01/02/2022   K 4.1 01/02/2022   CL 108 01/02/2022    CREATININE 1.81 (H) 01/02/2022   BUN 27 (H) 01/02/2022   CO2 26 01/02/2022   TSH 1.01 06/19/2021    US RENAL  Result Date: 04/08/2021 CLINICAL DATA:  Stage III B chronic kidney disease; sarcoidosis EXAM: RENAL / URINARY TRACT ULTRASOUND COMPLETE COMPARISON:  None. FINDINGS: Right Kidney: Renal measurements: 7.8 x 4.0 x 3.7 cm = volume: 60 mL. Diffusely echogenic renal parenchyma. Increased conspicuity of the corticomedullary interface. No discrete lesion. No hydronephrosis. Left Kidney: Renal measurements: 10.7 x 4.7 x 4.2 cm = volume: 112 mL. Diffusely echogenic renal parenchyma. Circumscribed anechoic simple cyst within imperceptible wall arising at the lower pole measures 3.1 x 2.2 x 2.7 cm. No hydronephrosis or solid nodule. Bladder: Appears normal for degree of bladder distention. Other: None. IMPRESSION: Small, echogenic kidneys consistent with underlying medical renal disease. No evidence of hydronephrosis. 3.1 cm simple cyst in the lower pole of the left kidney. Electronically Signed   By: Jacqulynn Cadet M.D.   On: 04/08/2021 07:36     Assessment & Plan:   Cheryl [name] was seen today for hypertension, anemia and hyperlipidemia.  Diagnoses and all orders for this visit:  Chronic renal disease, stage 4, severely decreased glomerular filtration rate (GFR) between 15-29 mL/min/1.73 square meter ([place])- Her renal function is stable.  I have asked her to stop taking NSAIDs. -     Basic metabolic panel; Future -     CBC with Differential/Platelet; Future -     CBC with Differential/Platelet -     Basic metabolic panel  Hypertension due to endocrine disorder- Her blood pressure is well controlled. -     Basic metabolic panel; Future -     CBC with Differential/Platelet; Future -     CBC with Differential/Platelet -     Basic metabolic panel  Hypoaldosteronism ([place])- Will continue spironolactone. -     Basic metabolic panel; Future -     Basic metabolic panel  Hyperlipidemia LDL goal  <70- LDL goal achieved. Doing well on the statin  -     Hepatic function panel; Future -     Lipid panel; Future -     Lipid panel -     Hepatic function panel  Anemia due to acquired thiamine deficiency -     CBC with Differential/Platelet; Future -     CBC with Differential/Platelet  Sarcoidosis- Will monitor for disease activity. -     DG Chest 2 View;  Future   I am having Cheryl [name] maintain her Rosuvastatin Calcium (CRESTOR PO), Levothyroxine Sodium (SYNTHROID PO), SPIRONOLACTONE PO, METOPROLOL TARTRATE PO, Cholecalciferol (VITAMIN D PO), BIOTIN PO, Omega-3 Fatty Acids (FISH OIL PO), diclofenac sodium, thiamine, bimatoprost, alendronate, OVER THE COUNTER MEDICATION, and montelukast.  No orders of the defined types were placed in this encounter.    Follow-up: Return in about 3 months (around 04/04/2022).  Scarlette Calico, MD

## 2022-01-02 NOTE — Patient Instructions (Signed)
Sarcoidosis  Sarcoidosis is a disease that can cause inflammation in many areas of the body. It most often affects the lungs (pulmonary sarcoidosis). Sarcoidosis can also affect the lymph nodes, liver, eyes, skin, heart, or any other body tissue. Normally, cells that are part of the body's disease-fighting system (immune system) attack harmful substances in the body, such as germs. This immune system response causes inflammation. After the harmful substance is destroyed, the inflammation goes away. When you have sarcoidosis, your immune system causes inflammation even when there are no harmful substances, and the inflammation does not go away. Sarcoidosis also causes cells from your immune system to form small lumps (granulomas) in the affected area of your body. What are the causes? The exact cause of sarcoidosis is not known.  If you have a family history of this disease (genetic predisposition), the immune system response that leads to inflammation may be triggered by something in your environment, such as: Bacteria or viruses. Metals. Chemicals. Dust. Mold or mildew. What increases the risk? You may be more likely to develop this condition if you: Have a family history of the disease. Are African American. Are of Northern European descent. Are 20-50 years old. Are female. Work as a firefighter. Work in an environment where you are exposed to metals, chemicals, mold or mildew, or insecticides. What are the signs or symptoms? Some people with sarcoidosis have no symptoms. Others have very mild symptoms. The symptoms usually depend on the organ that is affected. Sarcoidosis most often affects the lungs, which may lead to symptoms such as: Chest pain. Coughing. Wheezing. Shortness of breath. Other common symptoms include: Night sweats. Fever. Weight loss. Tiredness (fatigue). Swollen lymph nodes. Joint pain. How is this diagnosed? This condition may be diagnosed based on: Your  symptoms and medical history. A physical exam. Imaging tests such as: Chest X-ray. CT scan. MRI. PET scan. Lung function tests. These tests evaluate your breathing and check for problems that may be related to sarcoidosis. A procedure to remove a tissue sample for testing (biopsy). You may have a biopsy of lung tissue if that is where you are having symptoms. You may have tests to check for any complications of the condition. These tests may include: Eye exams. MRI of the heart or brain. Echocardiogram. ECG (electrocardiogram). How is this treated? In some cases, sarcoidosis does not require a specific treatment because it causes no symptoms or only mild symptoms. If your symptoms bother you or are severe, you may be prescribed medicines to reduce inflammation or relieve symptoms. These medicines may include: Prednisone. This is a steroid that reduces inflammation related to sarcoidosis. Hydroxychloroquine. This may be used to treat sarcoidosis that affects the skin, eyes, or brain. Certain medicines that affect the immune system. These can help with sarcoidosis in the joints, eyes, skin, or lungs. Medicines that you breathe in (inhalers). Inhalers can help you breathe if sarcoidosis affects your lungs. Follow these instructions at home:  Do not use any products that contain nicotine or tobacco. These products include cigarettes, chewing tobacco, and vaping devices, such as e-cigarettes. If you need help quitting, ask your health care provider. Avoid secondhand smoke and irritating dust or chemicals. Stay indoors on days when air quality is poor in your area. Return to your normal activities as told by your health care provider. Ask your health care provider what activities are safe for you. Take or use over-the-counter and prescription medicines only as told by your health care provider. Keep all follow-up visits.   This is important. Where to find more information National Heart, Lung,  and Blood Institute: www.nhlbi.nih.gov Contact a health care provider if: You have vision problems. You have a dry cough that does not go away. You have an irregular heartbeat. You have pain or aches in your joints, hands, or feet. You have an unexplained rash. Get help right away if: You have chest pain. You have trouble breathing. These symptoms may represent a serious problem that is an emergency. Do not wait to see if the symptoms will go away. Get medical help right away. Call your local emergency services (911 in the [place] Do not drive yourself to the hospital. Summary Sarcoidosis is a disease that can cause inflammation in many body areas of the body. It most often affects the lungs (pulmonary sarcoidosis). It can also affect the lymph nodes, liver, eyes, skin, heart, or any other body tissue. When you have sarcoidosis, cells from your immune system form small lumps (granulomas) in the affected area of your body. Sarcoidosis sometimes does not require a specific treatment because it causes no symptoms or only mild symptoms. If your symptoms bother you or are severe, you may be prescribed medicines to reduce inflammation or relieve symptoms. This information is not intended to replace advice given to you by your health care provider. Make sure you discuss any questions you have with your health care provider. Document Revised: 12/13/2019 Document Reviewed: 12/13/2019 Elsevier Patient Education  2023 [employer]  

## 2022-01-04 ENCOUNTER — Encounter: Payer: Self-pay | Admitting: Internal Medicine

## 2022-01-04 MED ORDER — THIAMINE HCL 100 MG PO TABS: 100.0000 mg | ORAL_TABLET | Freq: Every day | ORAL | 1 refills | Status: DC

## 2022-04-03 ENCOUNTER — Ambulatory Visit (INDEPENDENT_AMBULATORY_CARE_PROVIDER_SITE_OTHER): Payer: Medicare HMO | Admitting: Internal Medicine

## 2022-04-03 ENCOUNTER — Ambulatory Visit (INDEPENDENT_AMBULATORY_CARE_PROVIDER_SITE_OTHER): Payer: Medicare HMO

## 2022-04-03 ENCOUNTER — Encounter: Payer: Self-pay | Admitting: Internal Medicine

## 2022-04-03 VITALS — BP 122/70 | HR 56 | Temp 98.2°F | Ht 64.0 in | Wt 133.0 lb

## 2022-04-03 DIAGNOSIS — D869 Sarcoidosis, unspecified: Secondary | ICD-10-CM

## 2022-04-03 DIAGNOSIS — R5382 Chronic fatigue, unspecified: Secondary | ICD-10-CM | POA: Insufficient documentation

## 2022-04-03 DIAGNOSIS — N184 Chronic kidney disease, stage 4 (severe): Secondary | ICD-10-CM

## 2022-04-03 DIAGNOSIS — R9431 Abnormal electrocardiogram [ECG] [EKG]: Secondary | ICD-10-CM | POA: Diagnosis not present

## 2022-04-03 DIAGNOSIS — R052 Subacute cough: Secondary | ICD-10-CM

## 2022-04-03 DIAGNOSIS — D538 Other specified nutritional anemias: Secondary | ICD-10-CM

## 2022-04-03 DIAGNOSIS — Z0001 Encounter for general adult medical examination with abnormal findings: Secondary | ICD-10-CM | POA: Diagnosis not present

## 2022-04-03 DIAGNOSIS — E2839 Other primary ovarian failure: Secondary | ICD-10-CM

## 2022-04-03 DIAGNOSIS — E519 Thiamine deficiency, unspecified: Secondary | ICD-10-CM

## 2022-04-03 DIAGNOSIS — Z1159 Encounter for screening for other viral diseases: Secondary | ICD-10-CM | POA: Insufficient documentation

## 2022-04-03 DIAGNOSIS — I152 Hypertension secondary to endocrine disorders: Secondary | ICD-10-CM

## 2022-04-03 LAB — BASIC METABOLIC PANEL
BUN: 33 mg/dL — ABNORMAL HIGH (ref 6–23)
CO2: 25 mEq/L (ref 19–32)
Calcium: 10 mg/dL (ref 8.4–10.5)
Chloride: 106 mEq/L (ref 96–112)
Creatinine, Ser: 1.66 mg/dL — ABNORMAL HIGH (ref 0.40–1.20)
GFR: 30.35 mL/min — ABNORMAL LOW (ref >60.00)
Glucose, Bld: 91 mg/dL (ref 70–99)
Potassium: 4.4 mEq/L (ref 3.5–5.1)
Sodium: 142 mEq/L (ref 135–145)

## 2022-04-03 LAB — CBC WITH DIFFERENTIAL/PLATELET
Basophils Absolute: 0 10*3/uL (ref 0.0–0.1)
Basophils Relative: 0.6 % (ref 0.0–3.0)
Eosinophils Absolute: 0.1 10*3/uL (ref 0.0–0.7)
Eosinophils Relative: 2.2 % (ref 0.0–5.0)
HCT: 33.1 % — ABNORMAL LOW (ref 36.0–46.0)
Hemoglobin: 11.3 g/dL — ABNORMAL LOW (ref 12.0–15.0)
Lymphocytes Relative: 29 % (ref 12.0–46.0)
Lymphs Abs: 2 10*3/uL (ref 0.7–4.0)
MCHC: 34.1 g/dL (ref 30.0–36.0)
MCV: 94 fl (ref 78.0–100.0)
Monocytes Absolute: 0.4 10*3/uL (ref 0.1–1.0)
Monocytes Relative: 5.6 % (ref 3.0–12.0)
Neutro Abs: 4.2 10*3/uL (ref 1.4–7.7)
Neutrophils Relative %: 62.6 % (ref 43.0–77.0)
Platelets: 222 10*3/uL (ref 150.0–400.0)
RBC: 3.53 Mil/uL — ABNORMAL LOW (ref 3.87–5.11)
RDW: 13.9 % (ref 11.5–15.5)
WBC: 6.7 10*3/uL (ref 4.0–10.5)

## 2022-04-03 LAB — URINALYSIS, ROUTINE W REFLEX MICROSCOPIC
Bilirubin Urine: NEGATIVE
Hgb urine dipstick: NEGATIVE
Ketones, ur: NEGATIVE
Nitrite: NEGATIVE
Specific Gravity, Urine: 1.015 (ref 1.000–1.030)
Total Protein, Urine: NEGATIVE
Urine Glucose: NEGATIVE
Urobilinogen, UA: 0.2 (ref 0.0–1.0)
pH: 6.5 (ref 5.0–8.0)

## 2022-04-03 LAB — C-REACTIVE PROTEIN: CRP: <1 mg/dL (ref 0.5–20.0)

## 2022-04-03 LAB — BRAIN NATRIURETIC PEPTIDE: Pro B Natriuretic peptide (BNP): 189 pg/mL — ABNORMAL HIGH (ref 0.0–100.0)

## 2022-04-03 LAB — TSH: TSH: 2.52 u[IU]/mL (ref 0.35–5.50)

## 2022-04-03 LAB — TROPONIN I (HIGH SENSITIVITY): High Sens Troponin I: 8 ng/L (ref 2–17)

## 2022-04-03 NOTE — Patient Instructions (Signed)

## 2022-04-03 NOTE — Progress Notes (Signed)
Subjective:  Patient ID: Cheryl [name], female    DOB: 25-Apr-1950  Age: 72 y.o. MRN: [id number]  CC: Annual Exam, Hypothyroidism, Hypertension, and Anemia   HPI Cheryl [name] presents for a CPX and f/up -  She complains of a 7-monthhistory of fatigue and 2-week history of nonproductive cough.  She denies chest pain, shortness of breath, wheezing, night sweats, fever, chills, or sore throat.  Outpatient Medications Prior to Visit  Medication Sig Dispense Refill   alendronate (FOSAMAX) 10 MG tablet Take 70 mg by mouth once a week. Take with a full glass of water on an empty stomach.     bimatoprost (LUMIGAN) 0.01 % SOLN at bedtime.     BIOTIN PO Take by mouth.     Cholecalciferol (VITAMIN D PO) Take by mouth.     Levothyroxine Sodium (SYNTHROID PO) Take by mouth.     METOPROLOL TARTRATE PO Take by mouth.     montelukast (SINGULAIR) 10 MG tablet Take 10 mg by mouth at bedtime.     Omega-3 Fatty Acids (FISH OIL PO) Take by mouth.     OVER THE COUNTER MEDICATION Therawork     Rosuvastatin Calcium (CRESTOR PO) Take by mouth.     SPIRONOLACTONE PO Take by mouth.     thiamine (VITAMIN B1) 100 MG tablet Take 1 tablet (100 mg total) by mouth daily. 90 tablet 1   No facility-administered medications prior to visit.    ROS Review of Systems  Constitutional:  Positive for fatigue and unexpected weight change (wt loss). Negative for chills, diaphoresis and fever.  Respiratory:  Positive for cough. Negative for chest tightness, shortness of breath and wheezing.   Cardiovascular:  Negative for chest pain, palpitations and leg swelling.  Gastrointestinal:  Positive for constipation. Negative for abdominal pain, blood in stool, diarrhea, nausea and vomiting.  Genitourinary: Negative.   Musculoskeletal: Negative.   Skin: Negative.   Neurological: Negative.  Negative for dizziness, syncope, weakness and light-headedness.  Hematological:  Negative for adenopathy. Does not bruise/bleed  easily.  Psychiatric/Behavioral:  Positive for confusion and decreased concentration.     Objective:  BP 122/70 (BP Location: Right Arm, Patient Position: Sitting, Cuff Size: Large)   Pulse (!) 56   Temp 98.2 F (36.8 C) (Oral)   Ht 5' 4"$  (1.626 m)   Wt 133 lb (60.3 kg)   SpO2 95%   BMI 22.83 kg/m   BP Readings from Last 3 Encounters:  04/03/22 122/70  01/02/22 132/72  06/19/21 122/62    Wt Readings from Last 3 Encounters:  04/03/22 133 lb (60.3 kg)  01/02/22 135 lb (61.2 kg)  06/19/21 140 lb (63.5 kg)    Physical Exam Vitals reviewed.  HENT:     Nose: Nose normal.     Mouth/Throat:     Mouth: Mucous membranes are moist.  Eyes:     General: No scleral icterus.    Conjunctiva/sclera: Conjunctivae normal.  Cardiovascular:     Rate and Rhythm: Regular rhythm. Bradycardia present.     Heart sounds: Normal heart sounds, S1 normal and S2 normal. No murmur heard.    Comments: EKG- SB, 54 bpm TWI in anterior/lateral/inferior leads - unchanged No Q waves or LVH Pulmonary:     Breath sounds: No stridor. No wheezing, rhonchi or rales.  Abdominal:     General: Abdomen is flat.     Palpations: There is no mass.     Tenderness: There is no abdominal tenderness. There is no guarding.  Hernia: No hernia is present.  Musculoskeletal:        General: Normal range of motion.     Cervical back: Neck supple.     Right lower leg: No edema.     Left lower leg: No edema.  Lymphadenopathy:     Cervical: No cervical adenopathy.  Skin:    General: Skin is warm and dry.  Neurological:     General: No focal deficit present.     Mental Status: She is alert and oriented to person, place, and time.  Psychiatric:        Attention and Perception: She is attentive.        Speech: Speech is not tangential.        Behavior: Behavior normal. Behavior is cooperative.        Thought Content: Thought content normal.        Cognition and Memory: Cognition is impaired. Memory is impaired.      Lab Results  Component Value Date   WBC 6.7 04/03/2022   HGB 11.3 (L) 04/03/2022   HCT 33.1 (L) 04/03/2022   PLT 222.0 04/03/2022   GLUCOSE 91 04/03/2022   CHOL 125 01/02/2022   TRIG 65.0 01/02/2022   HDL 57.60 01/02/2022   LDLCALC 54 01/02/2022   ALT 9 01/02/2022   AST 21 01/02/2022   NA 142 04/03/2022   K 4.4 04/03/2022   CL 106 04/03/2022   CREATININE 1.66 (H) 04/03/2022   BUN 33 (H) 04/03/2022   CO2 25 04/03/2022   TSH 2.52 04/03/2022    US RENAL  Result Date: 04/08/2021 CLINICAL DATA:  Stage III B chronic kidney disease; sarcoidosis EXAM: RENAL / URINARY TRACT ULTRASOUND COMPLETE COMPARISON:  None. FINDINGS: Right Kidney: Renal measurements: 7.8 x 4.0 x 3.7 cm = volume: 60 mL. Diffusely echogenic renal parenchyma. Increased conspicuity of the corticomedullary interface. No discrete lesion. No hydronephrosis. Left Kidney: Renal measurements: 10.7 x 4.7 x 4.2 cm = volume: 112 mL. Diffusely echogenic renal parenchyma. Circumscribed anechoic simple cyst within imperceptible wall arising at the lower pole measures 3.1 x 2.2 x 2.7 cm. No hydronephrosis or solid nodule. Bladder: Appears normal for degree of bladder distention. Other: None. IMPRESSION: Small, echogenic kidneys consistent with underlying medical renal disease. No evidence of hydronephrosis. 3.1 cm simple cyst in the lower pole of the left kidney. Electronically Signed   By: Jacqulynn Cadet M.D.   On: 04/08/2021 07:36    DG Chest 2 View  Result Date: 04/03/2022 CLINICAL DATA:  cough for 2 weeks EXAM: CHEST - 2 VIEW COMPARISON:  07/06/2008 FINDINGS: Calcified hilar and mediastinal adenopathy consistent with old granulomatous disease is stable finding. Cardiac silhouette is unremarkable. No pneumothorax or pleural effusion. No pneumonia or pulmonary edema. The visualized skeletal structures are unremarkable. IMPRESSION: No acute cardiopulmonary process. Electronically Signed   By: Sammie Bench M.D.   On: 04/03/2022  11:56     Assessment & Plan:   Misty was seen today for annual exam, hypothyroidism, hypertension and anemia.  Diagnoses and all orders for this visit:  Hypertension due to endocrine disorder- Her blood pressure is adequately well-controlled. -     CBC with Differential/Platelet; Future -     Basic metabolic panel; Future -     TSH; Future -     TSH -     Basic metabolic panel -     CBC with Differential/Platelet  Chronic renal disease, stage 4, severely decreased glomerular filtration rate (GFR) between 15-29 mL/min/1.73 square  meter ([place])- Her renal function is stable. -     CBC with Differential/Platelet; Future -     Basic metabolic panel; Future -     Urinalysis, Routine w reflex microscopic; Future -     Urinalysis, Routine w reflex microscopic -     Basic metabolic panel -     CBC with Differential/Platelet  Sarcoidosis- There is no evidence of sarcoid activity. -     Basic metabolic panel; Future -     DG Chest 2 View; Future -     C-reactive protein; Future -     C-reactive protein -     Basic metabolic panel  Anemia due to acquired thiamine deficiency- She appears to have the anemia of chronic disease. -     CBC with Differential/Platelet; Future -     CBC with Differential/Platelet  Abnormal electrocardiogram- EKG is unchanged.  Labs are reassuring. -     EKG 12-Lead -     Brain natriuretic peptide; Future -     Troponin I (High Sensitivity); Future -     Troponin I (High Sensitivity) -     Brain natriuretic peptide  Chronic fatigue- Labs and EKG are negative for secondary causes. -     TSH; Future -     EKG 12-Lead -     Brain natriuretic peptide; Future -     Troponin I (High Sensitivity); Future -     Troponin I (High Sensitivity) -     Brain natriuretic peptide -     TSH  Subacute cough- Chest x-ray is negative for mass or infiltrate.  This is likely a viral URI. -     DG Chest 2 View; Future -     C-reactive protein; Future -     C-reactive  protein  Encounter for general adult medical examination with abnormal findings- Exam completed, labs reviewed, vaccines reviewed, cancer screenings are up-to-date, patient education was given. -     Hepatitis C antibody; Future -     Hepatitis C antibody  Need for hepatitis C screening test -     Hepatitis C antibody; Future -     Hepatitis C antibody  Estrogen deficiency -     DG Bone Density; Future   I am having Shimika Hemmer maintain her Rosuvastatin Calcium (CRESTOR PO), Levothyroxine Sodium (SYNTHROID PO), SPIRONOLACTONE PO, METOPROLOL TARTRATE PO, Cholecalciferol (VITAMIN D PO), BIOTIN PO, Omega-3 Fatty Acids (FISH OIL PO), bimatoprost, alendronate, OVER THE COUNTER MEDICATION, montelukast, and thiamine.  No orders of the defined types were placed in this encounter.    Follow-up: Return in about 3 months (around 07/02/2022).  Scarlette Calico, MD

## 2022-04-04 LAB — HEPATITIS C ANTIBODY: Hepatitis C Ab: NONREACTIVE

## 2022-04-21 ENCOUNTER — Other Ambulatory Visit: Payer: Self-pay | Admitting: Internal Medicine

## 2022-04-21 DIAGNOSIS — E2839 Other primary ovarian failure: Secondary | ICD-10-CM

## 2022-07-31 ENCOUNTER — Telehealth: Payer: Self-pay

## 2022-07-31 ENCOUNTER — Encounter: Payer: Self-pay | Admitting: Internal Medicine

## 2022-07-31 ENCOUNTER — Ambulatory Visit: Payer: Medicare HMO | Admitting: Internal Medicine

## 2022-07-31 ENCOUNTER — Ambulatory Visit (INDEPENDENT_AMBULATORY_CARE_PROVIDER_SITE_OTHER): Payer: Medicare HMO

## 2022-07-31 VITALS — BP 124/64 | HR 60 | Temp 98.0°F | Ht 64.0 in | Wt 133.0 lb

## 2022-07-31 DIAGNOSIS — R5382 Chronic fatigue, unspecified: Secondary | ICD-10-CM

## 2022-07-31 DIAGNOSIS — R9431 Abnormal electrocardiogram [ECG] [EKG]: Secondary | ICD-10-CM

## 2022-07-31 DIAGNOSIS — E519 Thiamine deficiency, unspecified: Secondary | ICD-10-CM | POA: Diagnosis not present

## 2022-07-31 DIAGNOSIS — D538 Other specified nutritional anemias: Secondary | ICD-10-CM | POA: Diagnosis not present

## 2022-07-31 DIAGNOSIS — R001 Bradycardia, unspecified: Secondary | ICD-10-CM | POA: Diagnosis not present

## 2022-07-31 DIAGNOSIS — D869 Sarcoidosis, unspecified: Secondary | ICD-10-CM | POA: Diagnosis not present

## 2022-07-31 DIAGNOSIS — I152 Hypertension secondary to endocrine disorders: Secondary | ICD-10-CM | POA: Diagnosis not present

## 2022-07-31 DIAGNOSIS — E785 Hyperlipidemia, unspecified: Secondary | ICD-10-CM

## 2022-07-31 DIAGNOSIS — F1920 Other psychoactive substance dependence, uncomplicated: Secondary | ICD-10-CM | POA: Insufficient documentation

## 2022-07-31 DIAGNOSIS — E2740 Unspecified adrenocortical insufficiency: Secondary | ICD-10-CM

## 2022-07-31 DIAGNOSIS — N184 Chronic kidney disease, stage 4 (severe): Secondary | ICD-10-CM | POA: Diagnosis not present

## 2022-07-31 LAB — BASIC METABOLIC PANEL
BUN: 27 mg/dL — ABNORMAL HIGH (ref 6–23)
CO2: 26 mEq/L (ref 19–32)
Calcium: 10.1 mg/dL (ref 8.4–10.5)
Chloride: 106 mEq/L (ref 96–112)
Creatinine, Ser: 1.62 mg/dL — ABNORMAL HIGH (ref 0.40–1.20)
GFR: 31.18 mL/min — ABNORMAL LOW (ref >60.00)
Glucose, Bld: 90 mg/dL (ref 70–99)
Potassium: 4.2 mEq/L (ref 3.5–5.1)
Sodium: 141 mEq/L (ref 135–145)

## 2022-07-31 LAB — CBC WITH DIFFERENTIAL/PLATELET
Basophils Absolute: 0 10*3/uL (ref 0.0–0.1)
Basophils Relative: 0.7 % (ref 0.0–3.0)
Eosinophils Absolute: 0.3 10*3/uL (ref 0.0–0.7)
Eosinophils Relative: 5.3 % — ABNORMAL HIGH (ref 0.0–5.0)
HCT: 35.4 % — ABNORMAL LOW (ref 36.0–46.0)
Hemoglobin: 11.2 g/dL — ABNORMAL LOW (ref 12.0–15.0)
Lymphocytes Relative: 31.5 % (ref 12.0–46.0)
Lymphs Abs: 2.1 10*3/uL (ref 0.7–4.0)
MCHC: 31.7 g/dL (ref 30.0–36.0)
MCV: 96 fl (ref 78.0–100.0)
Monocytes Absolute: 0.4 10*3/uL (ref 0.1–1.0)
Monocytes Relative: 5.9 % (ref 3.0–12.0)
Neutro Abs: 3.7 10*3/uL (ref 1.4–7.7)
Neutrophils Relative %: 56.6 % (ref 43.0–77.0)
Platelets: 247 10*3/uL (ref 150.0–400.0)
RBC: 3.69 Mil/uL — ABNORMAL LOW (ref 3.87–5.11)
RDW: 14.8 % (ref 11.5–15.5)
WBC: 6.5 10*3/uL (ref 4.0–10.5)

## 2022-07-31 LAB — C-REACTIVE PROTEIN: CRP: <1 mg/dL (ref 0.5–20.0)

## 2022-07-31 LAB — URINALYSIS, ROUTINE W REFLEX MICROSCOPIC
Bilirubin Urine: NEGATIVE
Hgb urine dipstick: NEGATIVE
Ketones, ur: NEGATIVE
Nitrite: NEGATIVE
Specific Gravity, Urine: 1.025 (ref 1.000–1.030)
Total Protein, Urine: NEGATIVE
Urine Glucose: NEGATIVE
Urobilinogen, UA: 0.2 (ref 0.0–1.0)
pH: 5.5 (ref 5.0–8.0)

## 2022-07-31 LAB — VITAMIN D 25 HYDROXY (VIT D DEFICIENCY, FRACTURES): VITD: 113.87 ng/mL (ref 30.00–100.00)

## 2022-07-31 LAB — BRAIN NATRIURETIC PEPTIDE: Pro B Natriuretic peptide (BNP): 272 pg/mL — ABNORMAL HIGH (ref 0.0–100.0)

## 2022-07-31 LAB — CK: Total CK: 169 U/L (ref 7–177)

## 2022-07-31 LAB — TROPONIN I (HIGH SENSITIVITY): High Sens Troponin I: 8 ng/L (ref 2–17)

## 2022-07-31 NOTE — Telephone Encounter (Signed)
Ask her to stop taking any supplement that contains Vit D

## 2022-07-31 NOTE — Telephone Encounter (Signed)
CRITICAL VALUE STICKER  CRITICAL VALUE: Vit. D  RECEIVER (on-site recipient of call): Kim  DATE & TIME NOTIFIED: 6/6 @ 1609  MESSENGER (representative from lab): Sig  MD NOTIFIED: [name]   RESPONSE:  Patient has Critical Vitamin D 113.87.

## 2022-07-31 NOTE — Progress Notes (Signed)
Subjective:  Patient ID: Cheryl [name], female    DOB: 04-Dec-1950  Age: 72 y.o. MRN: [id number]  CC: Cough   HPI Cheryl [name] presents for f/up ---  She complains of persistent cough and fatigue.  She is active and denies chest pain, dizziness, lightheadedness, syncope, near syncope, edema, or palpitations.  Outpatient Medications Prior to Visit  Medication Sig Dispense Refill   alendronate (FOSAMAX) 10 MG tablet Take 70 mg by mouth once a week. Take with a full glass of water on an empty stomach.     bimatoprost (LUMIGAN) 0.01 % SOLN at bedtime.     BIOTIN PO Take by mouth.     Levothyroxine Sodium (SYNTHROID PO) Take by mouth.     METOPROLOL TARTRATE PO Take by mouth.     montelukast (SINGULAIR) 10 MG tablet Take 10 mg by mouth at bedtime.     Omega-3 Fatty Acids (FISH OIL PO) Take by mouth.     Rosuvastatin Calcium (CRESTOR PO) Take by mouth.     SPIRONOLACTONE PO Take by mouth.     thiamine (VITAMIN B1) 100 MG tablet Take 1 tablet (100 mg total) by mouth daily. 90 tablet 1   Cholecalciferol (VITAMIN D PO) Take by mouth.     OVER THE COUNTER MEDICATION Therawork     No facility-administered medications prior to visit.    ROS Review of Systems  Constitutional:  Positive for fatigue. Negative for appetite change, chills, diaphoresis and unexpected weight change.  HENT: Negative.    Eyes: Negative.   Respiratory:  Positive for cough and shortness of breath. Negative for chest tightness and wheezing.   Cardiovascular:  Negative for chest pain, palpitations and leg swelling.  Gastrointestinal:  Negative for abdominal pain, constipation, diarrhea, nausea and vomiting.  Genitourinary: Negative.   Musculoskeletal: Negative.  Negative for arthralgias.  Skin: Negative.   Neurological:  Negative for dizziness, weakness and light-headedness.  Hematological:  Negative for adenopathy. Does not bruise/bleed easily.  Psychiatric/Behavioral:  Positive for confusion and decreased  concentration. Negative for sleep disturbance. The patient is not nervous/anxious.     Objective:  BP 124/64 (BP Location: Right Arm, Patient Position: Sitting, Cuff Size: Large)   Pulse 60   Temp 98 F (36.7 C) (Oral)   Ht 5\' 4"  (1.626 m)   Wt 133 lb (60.3 kg)   SpO2 94%   BMI 22.83 kg/m   BP Readings from Last 3 Encounters:  07/31/22 124/64  04/03/22 122/70  01/02/22 132/72    Wt Readings from Last 3 Encounters:  07/31/22 133 lb (60.3 kg)  04/03/22 133 lb (60.3 kg)  01/02/22 135 lb (61.2 kg)    Physical Exam Vitals reviewed.  Constitutional:      General: She is not in acute distress.    Appearance: She is not ill-appearing, toxic-appearing or diaphoretic.  HENT:     Mouth/Throat:     Mouth: Mucous membranes are moist.  Eyes:     General: No scleral icterus.    Conjunctiva/sclera: Conjunctivae normal.  Cardiovascular:     Rate and Rhythm: Regular rhythm. Bradycardia present.     Heart sounds: Normal heart sounds, S1 normal and S2 normal. No murmur heard.    No friction rub. No gallop.     Comments: EKG- SB, 57 bpm Septal infarct pattern TWI in inferior/lateral leads  Unchanged Pulmonary:     Effort: Pulmonary effort is normal.     Breath sounds: No stridor. No wheezing, rhonchi or rales.  Abdominal:  General: Abdomen is flat.     Palpations: There is no mass.     Tenderness: There is no abdominal tenderness. There is no guarding or rebound.     Hernia: No hernia is present.  Musculoskeletal:     Cervical back: Neck supple.     Right lower leg: No edema.     Left lower leg: No edema.  Lymphadenopathy:     Cervical: No cervical adenopathy.  Skin:    General: Skin is warm and dry.  Neurological:     General: No focal deficit present.     Mental Status: She is alert. Mental status is at baseline.  Psychiatric:        Mood and Affect: Mood normal.        Behavior: Behavior normal.     Lab Results  Component Value Date   WBC 6.5 07/31/2022    HGB 11.2 (L) 07/31/2022   HCT 35.4 (L) 07/31/2022   PLT 247.0 07/31/2022   GLUCOSE 90 07/31/2022   CHOL 125 01/02/2022   TRIG 65.0 01/02/2022   HDL 57.60 01/02/2022   LDLCALC 54 01/02/2022   ALT 9 01/02/2022   AST 21 01/02/2022   NA 141 07/31/2022   K 4.2 07/31/2022   CL 106 07/31/2022   CREATININE 1.62 (H) 07/31/2022   BUN 27 (H) 07/31/2022   CO2 26 07/31/2022   TSH 2.52 04/03/2022    US RENAL  Result Date: 04/08/2021 CLINICAL DATA:  Stage III B chronic kidney disease; sarcoidosis EXAM: RENAL / URINARY TRACT ULTRASOUND COMPLETE COMPARISON:  None. FINDINGS: Right Kidney: Renal measurements: 7.8 x 4.0 x 3.7 cm = volume: 60 mL. Diffusely echogenic renal parenchyma. Increased conspicuity of the corticomedullary interface. No discrete lesion. No hydronephrosis. Left Kidney: Renal measurements: 10.7 x 4.7 x 4.2 cm = volume: 112 mL. Diffusely echogenic renal parenchyma. Circumscribed anechoic simple cyst within imperceptible wall arising at the lower pole measures 3.1 x 2.2 x 2.7 cm. No hydronephrosis or solid nodule. Bladder: Appears normal for degree of bladder distention. Other: None. IMPRESSION: Small, echogenic kidneys consistent with underlying medical renal disease. No evidence of hydronephrosis. 3.1 cm simple cyst in the lower pole of the left kidney. Electronically Signed   By: Malachy Moan M.D.   On: 04/08/2021 07:36   DG Chest 2 View  Result Date: 07/31/2022 CLINICAL DATA:  Worsening cough for 1 month. History of sarcoidosis. EXAM: CHEST - 2 VIEW COMPARISON:  04/03/2022. FINDINGS: Cardiac silhouette normal in size. No mediastinal or hilar masses. Calcified mediastinal hilar lymph nodes are stable. No enlarged lymph nodes. Clear lungs.  No pleural effusion or pneumothorax. Skeletal structures are intact. IMPRESSION: No active cardiopulmonary disease. Electronically Signed   By: Amie Portland M.D.   On: 07/31/2022 12:18     Assessment & Plan:   Anemia due to acquired thiamine  deficiency -     CBC with Differential/Platelet; Future  Chronic renal disease, stage 4, severely decreased glomerular filtration rate (GFR) between 15-29 mL/min/1.73 square meter ([facility])- Her renal function is stable.  She has a high vitamin D level so I have asked her to avoid vitamin D supplements. -     Basic metabolic panel; Future -     Urinalysis, Routine w reflex microscopic; Future -     VITAMIN D 25 Hydroxy (Vit-D Deficiency, Fractures); Future  Hypertension due to endocrine disorder -     Basic metabolic panel; Future -     Urinalysis, Routine w reflex microscopic; Future  Hypoaldosteronism (HCC) -     Basic metabolic panel; Future  Sarcoidosis- Labs and x-ray are reassuring.  Will evaluate for cardiac involvement with a heart monitor and echocardiogram. -     Basic metabolic panel; Future -     C-reactive protein; Future -     DG Chest 2 View; Future -     VITAMIN D 25 Hydroxy (Vit-D Deficiency, Fractures); Future -     CARDIAC EVENT MONITOR; Future  Bradycardia -     EKG 12-Lead -     CARDIAC EVENT MONITOR; Future  Abnormal electrocardiogram -     Troponin I (High Sensitivity); Future -     Brain natriuretic peptide; Future -     ECHOCARDIOGRAM COMPLETE; Future  Chronic fatigue -     Troponin I (High Sensitivity); Future -     Brain natriuretic peptide; Future -     CARDIAC EVENT MONITOR; Future  Hyperlipidemia LDL goal <70 -     CK; Future     Follow-up: No follow-ups on file.  Sanda Linger, MD

## 2022-08-02 DIAGNOSIS — R001 Bradycardia, unspecified: Secondary | ICD-10-CM | POA: Insufficient documentation

## 2022-08-04 ENCOUNTER — Telehealth: Payer: Self-pay

## 2022-08-05 NOTE — Telephone Encounter (Signed)
Unsuccessful attempt to reach patient on preferred number listed in notes for scheduled AWV, Left message on voicemail okay to reschedule. 

## 2022-08-06 NOTE — Telephone Encounter (Signed)
Patient made aware to stop Vit. D. Supplements. She has been taking 50 mcg daily for many years.   She stated she has gotten her monitor in the mail and is scheduled for her Echo in July. We will be in touch for next appointment pending those results.  No additional questions at this time.

## 2022-08-09 DIAGNOSIS — R5382 Chronic fatigue, unspecified: Secondary | ICD-10-CM | POA: Diagnosis not present

## 2022-08-09 DIAGNOSIS — D869 Sarcoidosis, unspecified: Secondary | ICD-10-CM | POA: Diagnosis not present

## 2022-08-09 DIAGNOSIS — R001 Bradycardia, unspecified: Secondary | ICD-10-CM | POA: Diagnosis not present

## 2022-08-29 IMAGING — CT CT ANGIO CHEST
2 of 8 series · 16 of 46 positions shown · IV contrast (agent unspecified)
Comparison: Chest two views 07/06/2008

CLINICAL DATA: Thoracic aortic disease, postoperative. Remote
history of endoscopic repair of thoracic aortic aneurysm 20 years
ago.

EXAM:
CT ANGIOGRAPHY CHEST WITH CONTRAST
TECHNIQUE: Multidetector CT imaging of the chest was performed using the
standard protocol during bolus administration of intravenous
contrast. Multiplanar CT image reconstructions and MIPs were
obtained to evaluate the vascular anatomy.

[Series 4: aorta 3.0 bf37 2 · axial · 0.61mm/px · z∈[-270,-24]mm · 13 of 96 slices shown]
[im 7/96  lung]
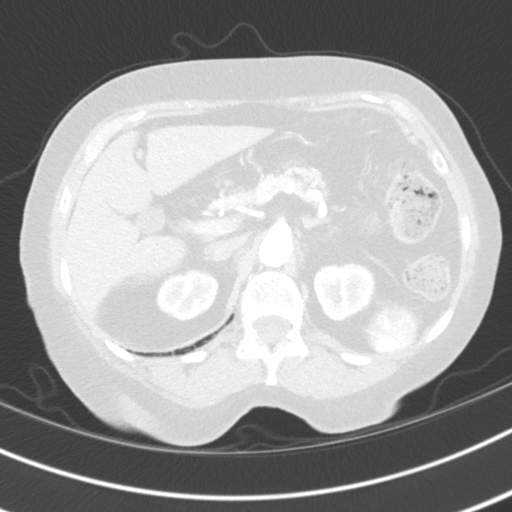
[im 14/96  soft-tissue]
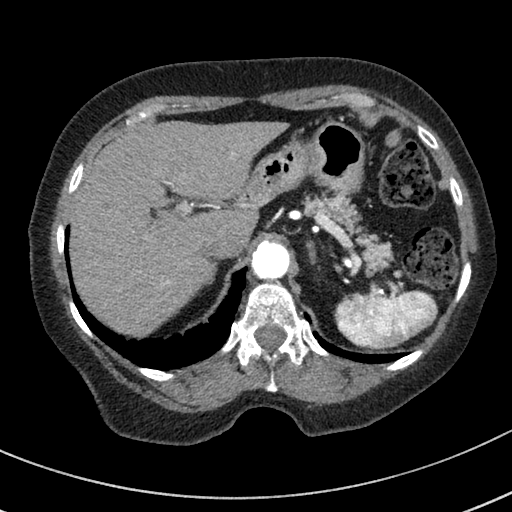
[im 21/96  lung]
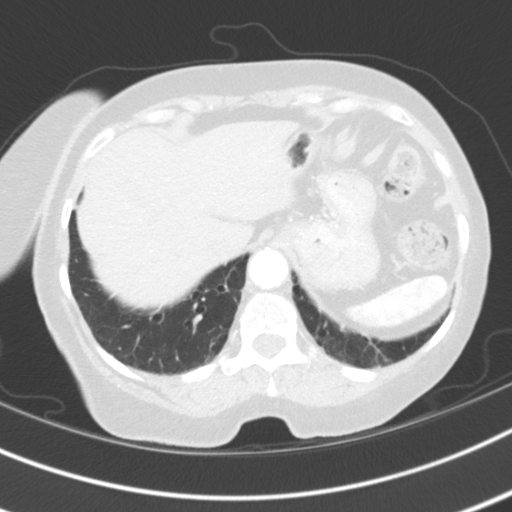
[im 28/96  soft-tissue]
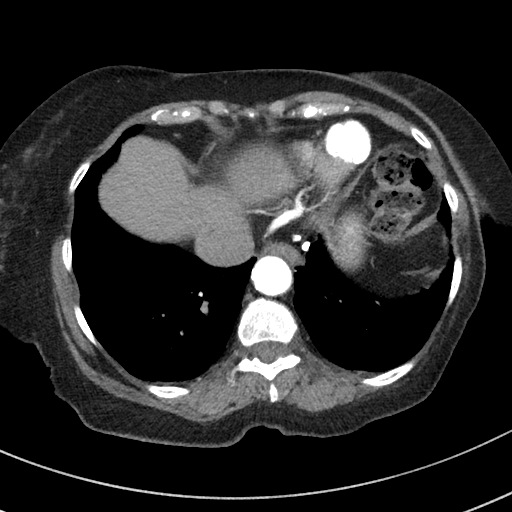
[im 34/96  lung]
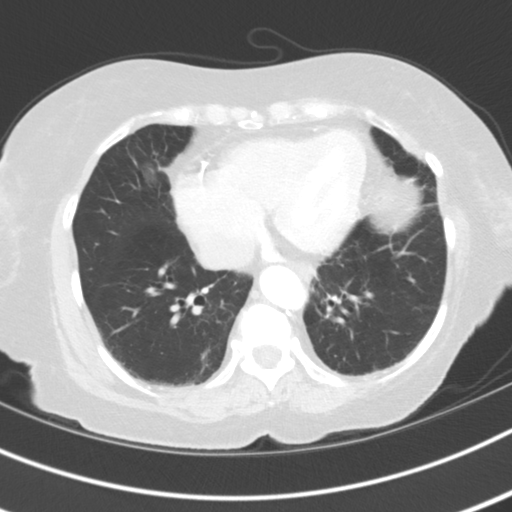
[im 41/96  soft-tissue]
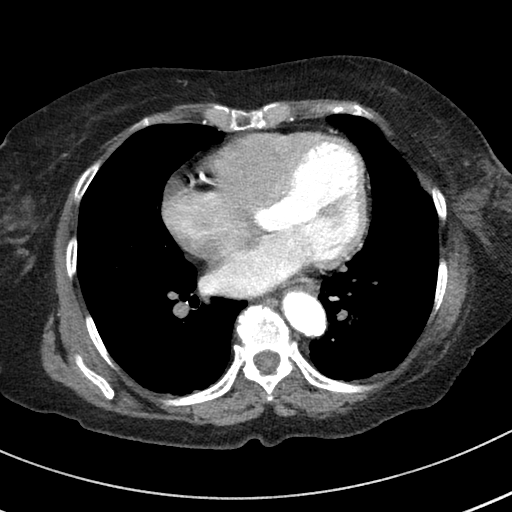
[im 48/96  lung]
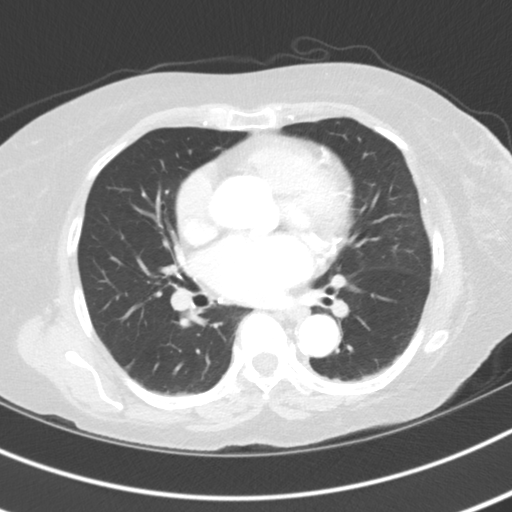
[im 55/96  soft-tissue]
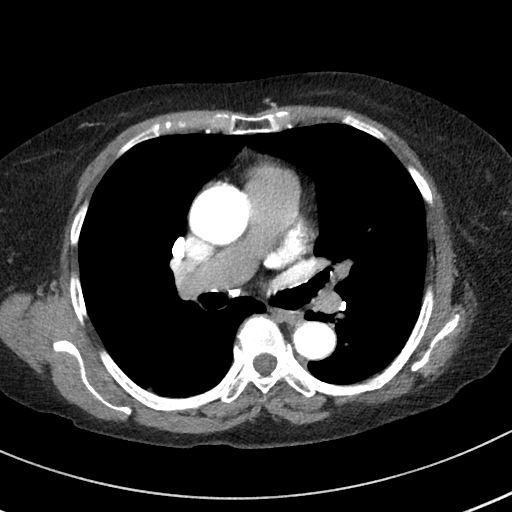
[im 62/96  lung]
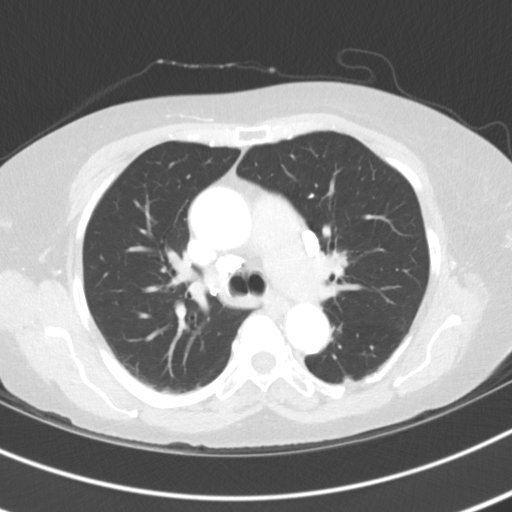
[im 68/96  soft-tissue]
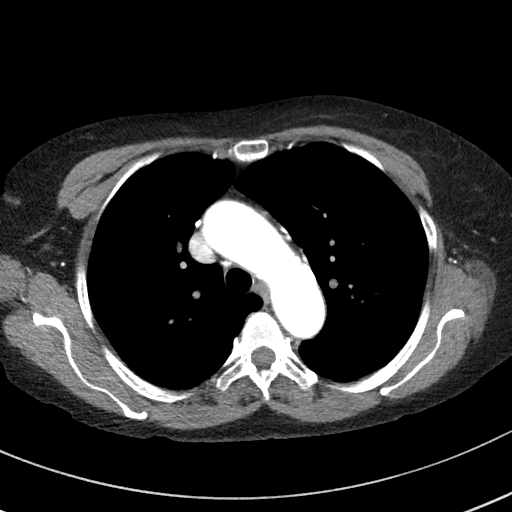
[im 75/96  lung]
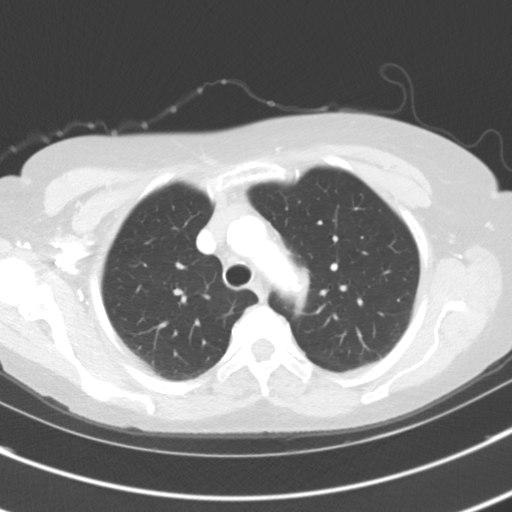
[im 82/96  soft-tissue]
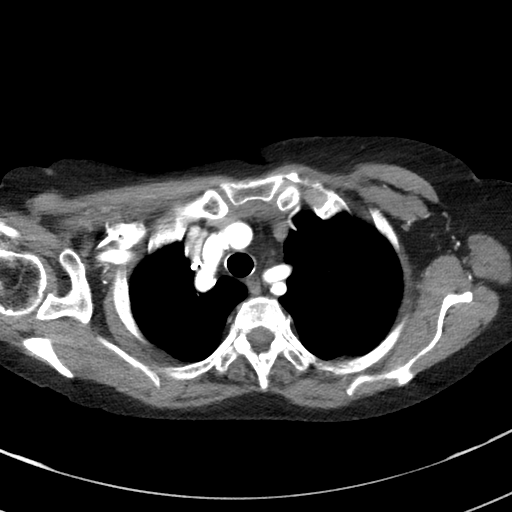
[im 89/96  lung]
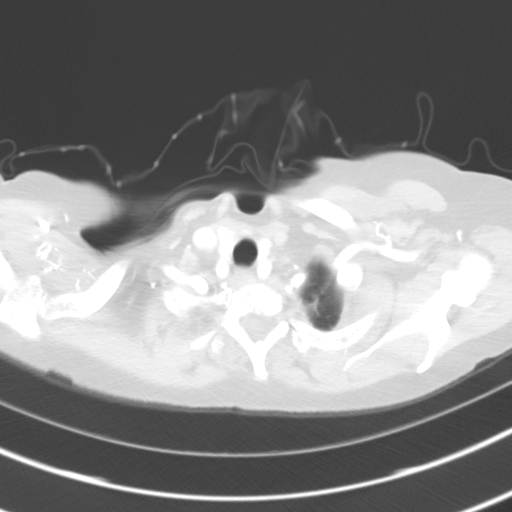

[Series 7: coronals · coronal · 0.55mm/px · 3 of 112 slices shown]
[im 28/112  soft-tissue]
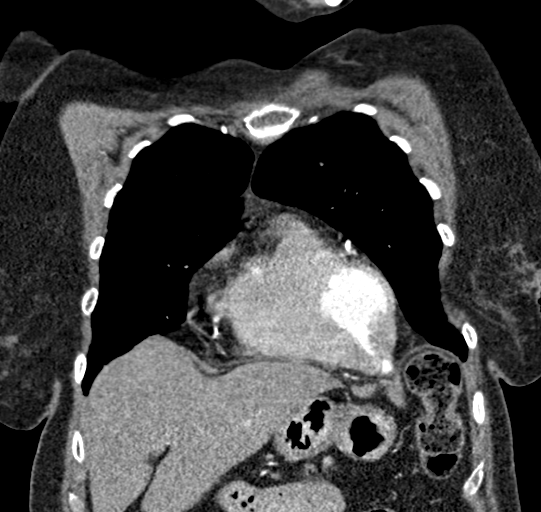
[im 56/112  soft-tissue]
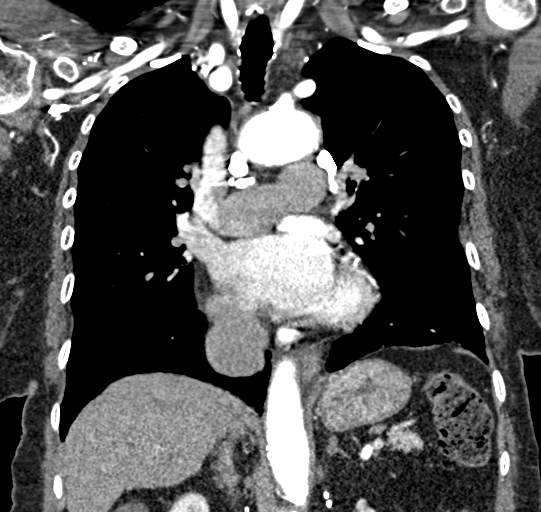
[im 84/112  soft-tissue]
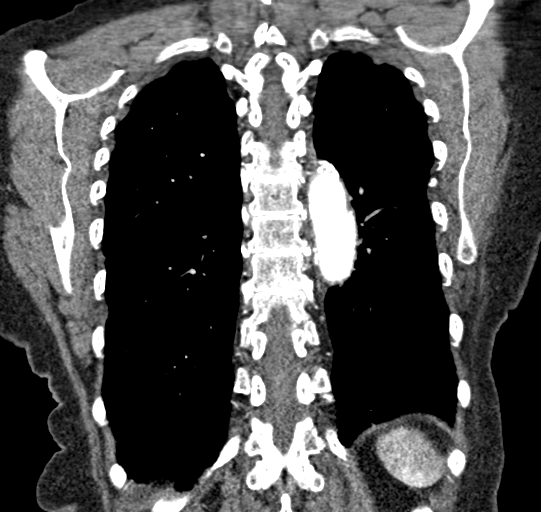

[16 of 46 positions shown; findings below may reference images not displayed]

RADIATION DOSE REDUCTION: This exam was performed according to the
departmental dose-optimization program which includes automated
exposure control, adjustment of the mA and/or kV according to
patient size and/or use of iterative reconstruction technique.

CONTRAST:  50mL OMNIPAQUE IOHEXOL 350 MG/ML SOLN. Decreased dose due
to GFR of 31.
FINDINGS: Cardiovascular: Heart size is moderately enlarged. No pericardial
effusion. Dense coronary artery calcifications are seen.

Mild atherosclerotic calcification within the aortic root. The
ascending aorta measures up to 3.6 x 3.7 cm (transverse by AP)
ectatic but not aneurysmal. Moderate calcifications within the
aortic arch and descending thoracic aorta. The aortic arch is normal
in caliber. The descending thoracic aorta measures 2.6 cm of the
level of the main pulmonary artery (as measured on coronal series 7,
image 81). There are mild areas of indentation within the aortic
wall consistent with prior postsurgical change within the ascending
aorta and the proximal descending aorta. No aortic dissection is
seen. Moderate atherosclerotic calcifications around the origin of
the celiac axis and superior mesenteric artery.

The main pulmonary artery is only minimally opacified, limiting
evaluation for a pulmonary embolism.

Mediastinum/Nodes: No axillary, mediastinal or hilar pathologically
enlarged lymph nodes by CT criteria. As suggested on prior
radiographs, there are calcified mediastinal and hilar lymph nodes
that are likely the sequela of remote granulomatous infection. The
visualized thyroid is grossly unremarkable. The esophagus follows a
normal course of normal caliber.

Lungs/Pleura: The central airways are patent. Mild bilateral
posterior dependent interlobular septal thickening, possible mild
scarring versus subsegmental atelectasis. No pleural effusion or
pneumothorax.

Upper Abdomen: Normal right adrenal gland. There is a 1.6 by 1.0 by
1.6 cm (transverse by AP by craniocaudal) nodule at the inferior
aspect of the lateral limb of the left adrenal gland (axial series
4, image 93). This demonstrates internal density of 78 Hounsfield
units and is nonspecific.

Musculoskeletal: Mild-to-moderate multilevel degenerative disc
changes of the thoracic spine. No aggressive lytic or blastic
osseous lesion is seen.

Review of the MIP images confirms the above findings.
IMPRESSION: :
IMPRESSION: 1. Ectatic ascending aorta measuring up to 3.7 cm. No thoracic
aortic aneurysm.
2. Moderate cardiomegaly. Coronary artery calcifications indicate
coronary artery disease.
3. Redemonstration of calcified mediastinal and hilar lymph nodes,
likely the sequela of prior remote granulomatous infection.
4. Left adrenal nodule measuring up to 1.6 cm that does not qualify
as a benign lipid rich adenoma by density criteria. Correlation with
a prior CT of the chest or abdomen would be helpful to assess for
potential long-term stability which would indicate this is benign.
If no prior cross-sectional imaging is available for comparison, a
CT of the abdomen adrenal washout protocol may be considered for
further evaluation, if clinically indicated.

## 2022-08-31 IMAGING — US US RENAL
1 series · 14 of 25 positions shown · non-contrast
Comparison: None.

CLINICAL DATA: Stage III B chronic kidney disease; sarcoidosis

EXAM:
RENAL / URINARY TRACT ULTRASOUND COMPLETE

[Series 1: us renal · 0.19mm/px · 14 of 30 slices shown]
[im 1/30]
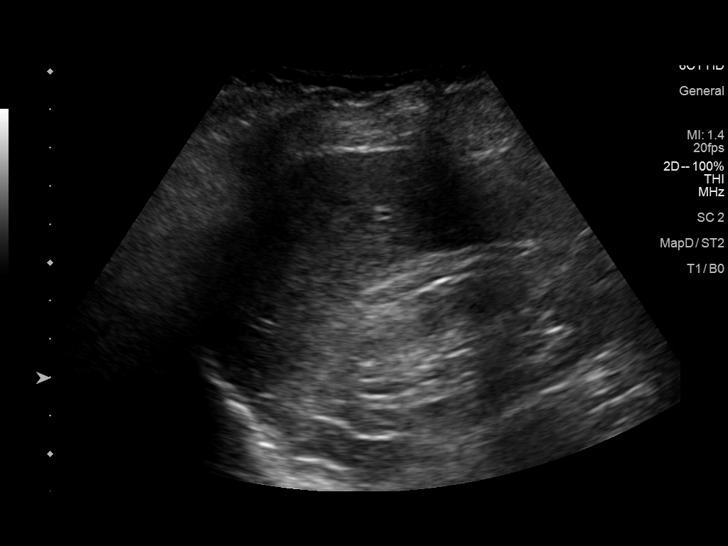
[im 3/30]
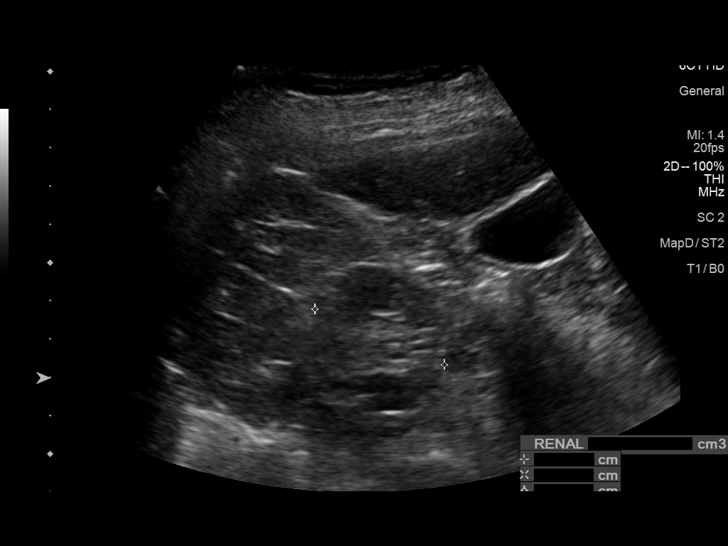
[im 5/30]
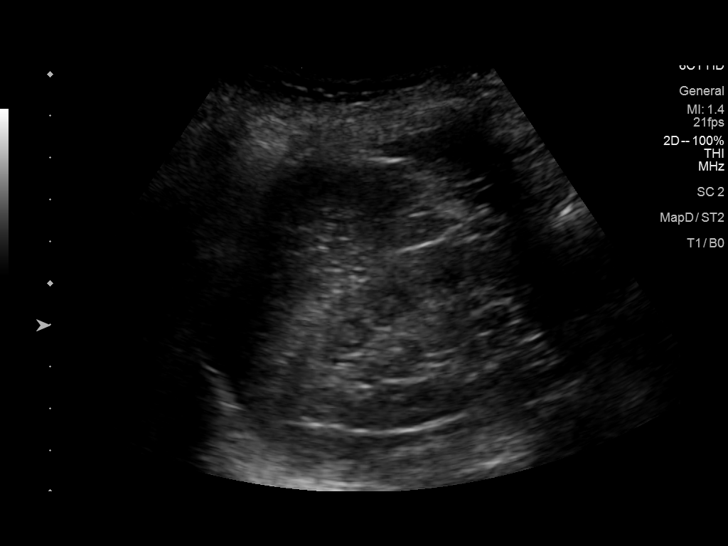
[im 8/30]
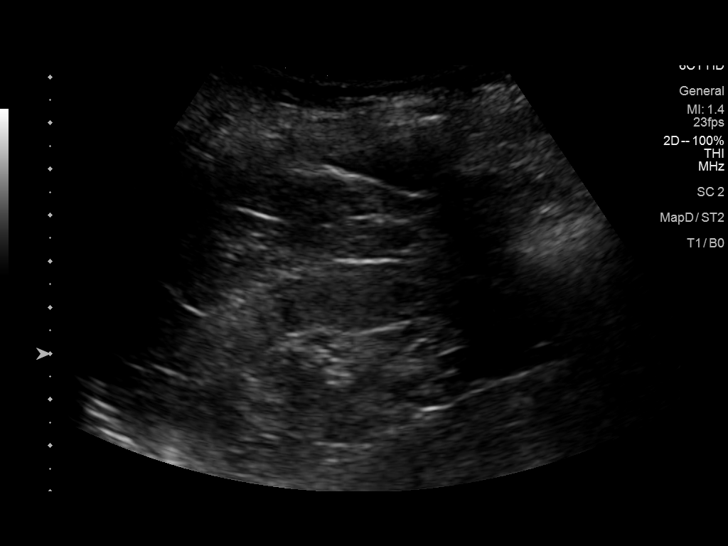
[im 10/30]
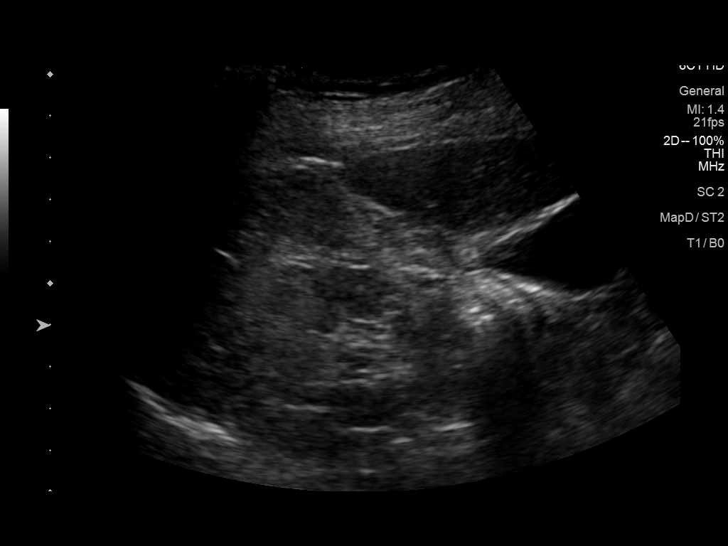
[im 11/30]
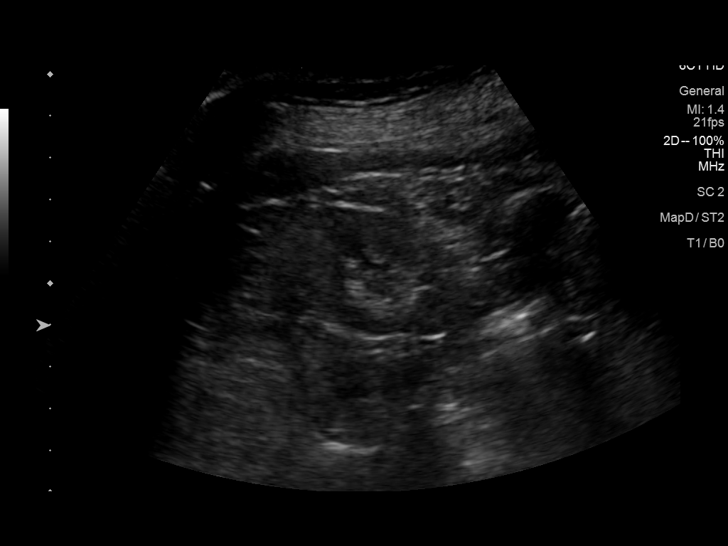
[im 14/30]
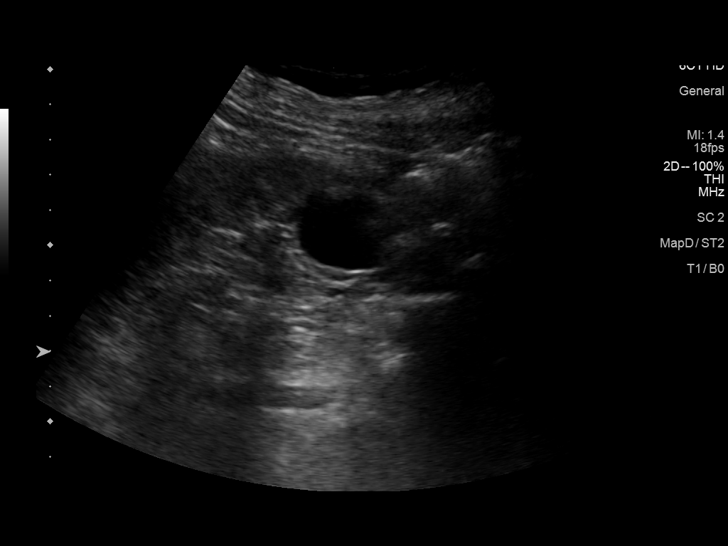
[im 16/30]
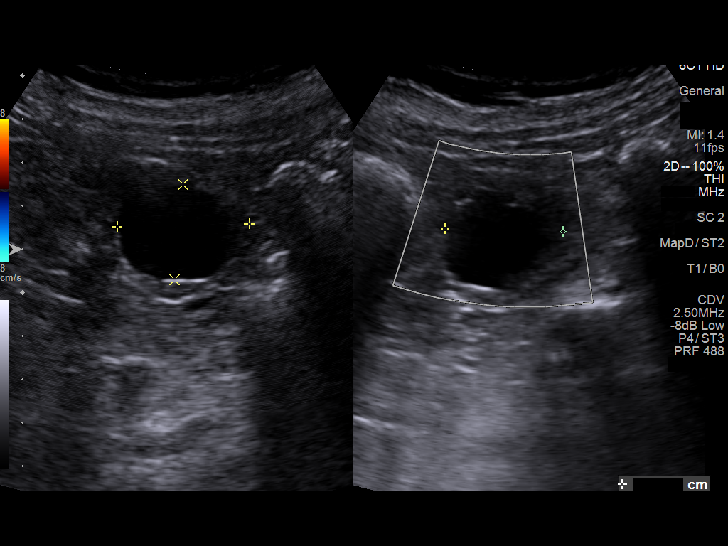
[im 19/30]
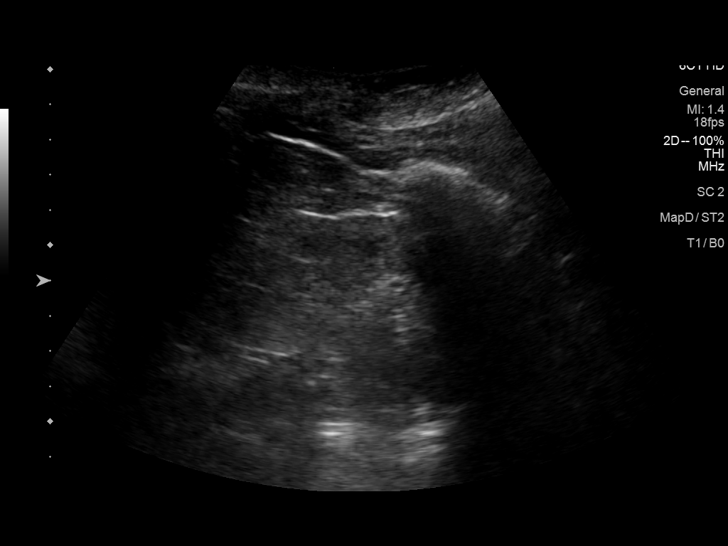
[im 20/30]
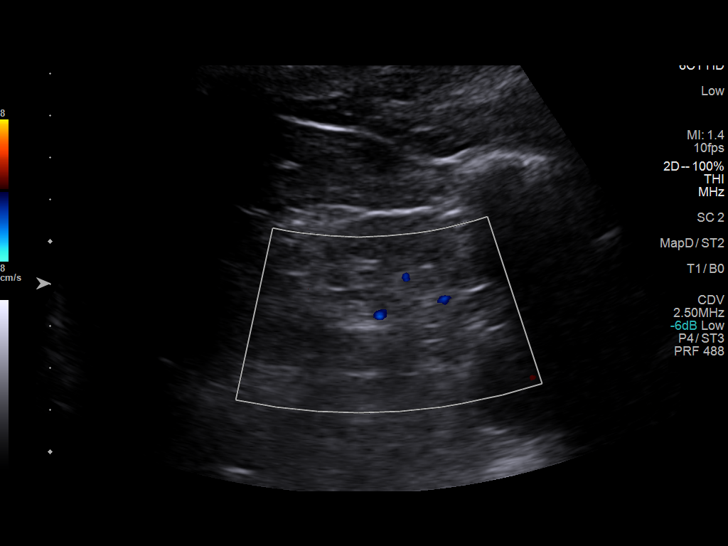
[im 22/30]
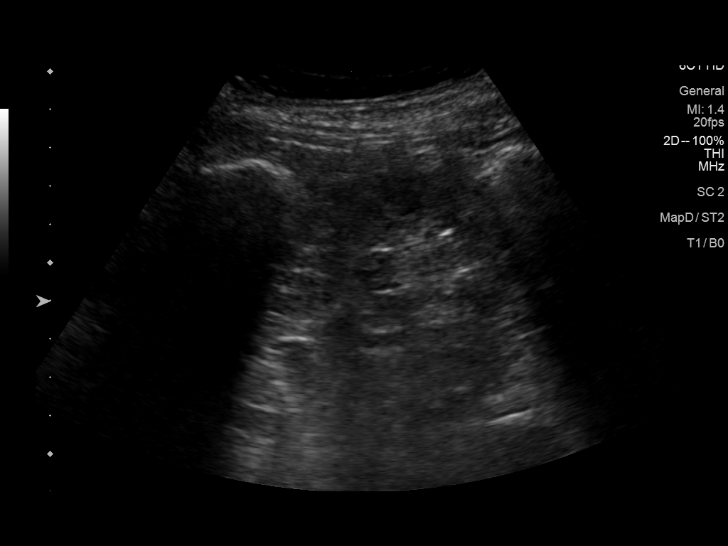
[im 25/30]
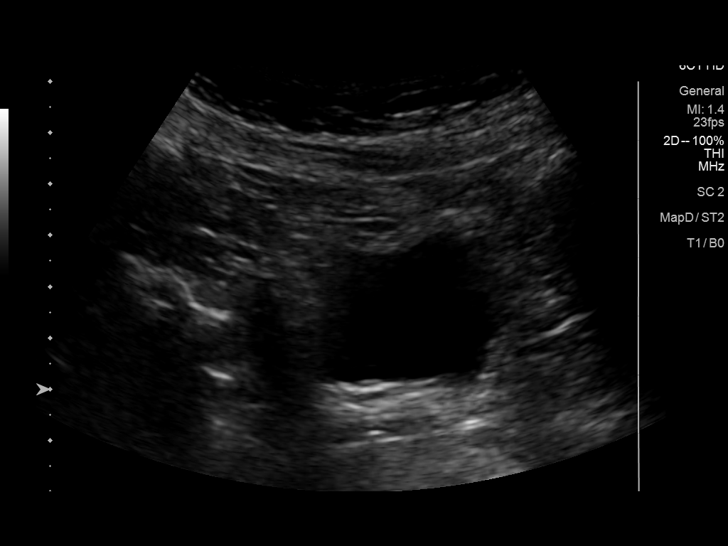
[im 27/30]
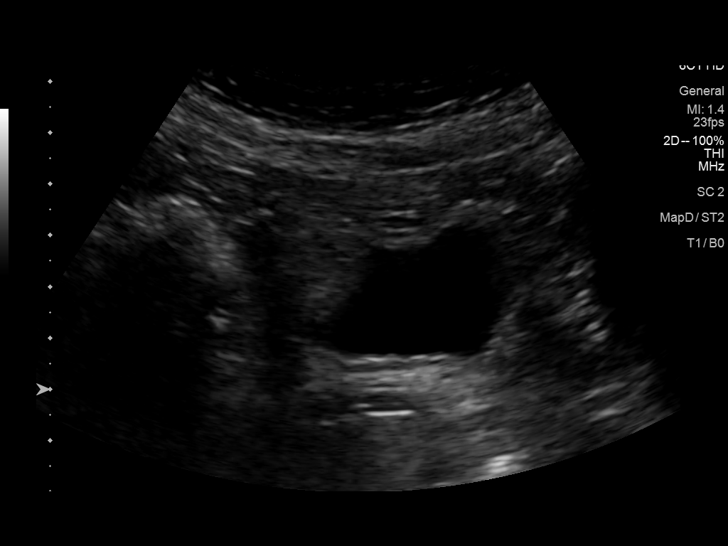
[im 30/30]
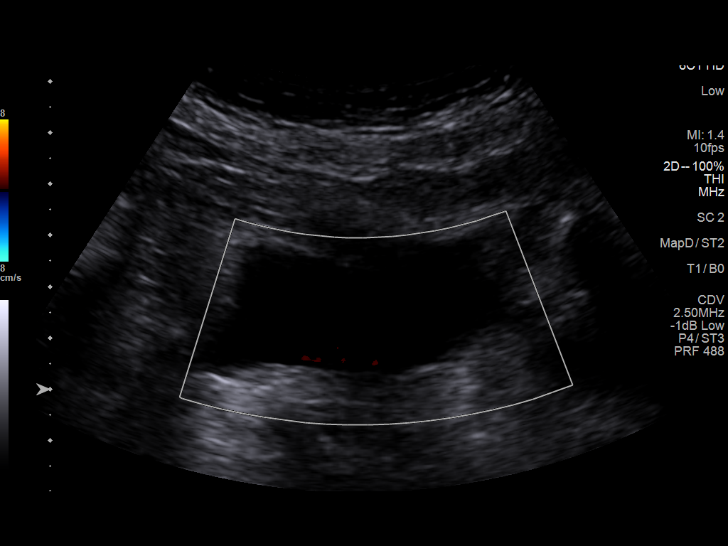

[14 of 25 positions shown; findings below may reference images not displayed]

FINDINGS: Right Kidney:

Renal measurements: 7.8 x 4.0 x 3.7 cm = volume: 60 mL. Diffusely
echogenic renal parenchyma. Increased conspicuity of the
corticomedullary interface. No discrete lesion. No hydronephrosis.

Left Kidney:

Renal measurements: 10.7 x 4.7 x 4.2 cm = volume: 112 mL. Diffusely
echogenic renal parenchyma. Circumscribed anechoic simple cyst
within imperceptible wall arising at the lower pole measures 3.1 x
2.2 x 2.7 cm. No hydronephrosis or solid nodule.

Bladder:

Appears normal for degree of bladder distention.

Other:

None.
IMPRESSION: Small, echogenic kidneys consistent with underlying medical renal
disease.

No evidence of hydronephrosis.

3.1 cm simple cyst in the lower pole of the left kidney.

## 2022-09-16 ENCOUNTER — Ambulatory Visit: Payer: Medicare HMO | Attending: Internal Medicine

## 2022-09-16 DIAGNOSIS — R001 Bradycardia, unspecified: Secondary | ICD-10-CM

## 2022-09-16 DIAGNOSIS — R5382 Chronic fatigue, unspecified: Secondary | ICD-10-CM

## 2022-09-16 DIAGNOSIS — D869 Sarcoidosis, unspecified: Secondary | ICD-10-CM

## 2022-09-19 ENCOUNTER — Ambulatory Visit (HOSPITAL_BASED_OUTPATIENT_CLINIC_OR_DEPARTMENT_OTHER): Payer: Medicare HMO

## 2022-09-19 DIAGNOSIS — R9431 Abnormal electrocardiogram [ECG] [EKG]: Secondary | ICD-10-CM

## 2022-09-19 LAB — ECHOCARDIOGRAM COMPLETE
AR max vel: 1.56 cm2
AV Area VTI: 1.54 cm2
AV Area mean vel: 1.57 cm2
AV Mean grad: 3 mmHg
AV Peak grad: 5.7 mmHg
AV Vena cont: 0.3 cm
Ao pk vel: 1.19 m/s
Area-P 1/2: 1.37 cm2
P 1/2 time: 571 msec
S' Lateral: 3.48 cm

## 2022-09-19 MED ORDER — PERFLUTREN LIPID MICROSPHERE
1.0000 mL | INTRAVENOUS | Status: AC | PRN
Start: 2022-09-19 — End: 2022-09-19
  Administered 2022-09-19: 3 mL via INTRAVENOUS

## 2022-09-20 ENCOUNTER — Other Ambulatory Visit: Payer: Self-pay | Admitting: Internal Medicine

## 2022-09-20 DIAGNOSIS — I351 Nonrheumatic aortic (valve) insufficiency: Secondary | ICD-10-CM

## 2022-09-22 ENCOUNTER — Inpatient Hospital Stay: Admission: RE | Admit: 2022-09-22 | Payer: Medicare HMO | Source: Ambulatory Visit

## 2022-10-02 ENCOUNTER — Ambulatory Visit: Payer: Medicare HMO | Admitting: Internal Medicine

## 2022-10-03 ENCOUNTER — Ambulatory Visit (INDEPENDENT_AMBULATORY_CARE_PROVIDER_SITE_OTHER): Payer: Medicare HMO | Admitting: Internal Medicine

## 2022-10-03 ENCOUNTER — Encounter: Payer: Self-pay | Admitting: Internal Medicine

## 2022-10-03 VITALS — BP 112/66 | HR 56 | Temp 97.6°F | Resp 16 | Ht 64.0 in | Wt 132.0 lb

## 2022-10-03 DIAGNOSIS — E785 Hyperlipidemia, unspecified: Secondary | ICD-10-CM | POA: Diagnosis not present

## 2022-10-03 DIAGNOSIS — I10 Essential (primary) hypertension: Secondary | ICD-10-CM

## 2022-10-03 DIAGNOSIS — I351 Nonrheumatic aortic (valve) insufficiency: Secondary | ICD-10-CM | POA: Diagnosis not present

## 2022-10-03 DIAGNOSIS — E519 Thiamine deficiency, unspecified: Secondary | ICD-10-CM | POA: Diagnosis not present

## 2022-10-03 DIAGNOSIS — R001 Bradycardia, unspecified: Secondary | ICD-10-CM

## 2022-10-03 DIAGNOSIS — D869 Sarcoidosis, unspecified: Secondary | ICD-10-CM

## 2022-10-03 DIAGNOSIS — E2740 Unspecified adrenocortical insufficiency: Secondary | ICD-10-CM | POA: Diagnosis not present

## 2022-10-03 DIAGNOSIS — N184 Chronic kidney disease, stage 4 (severe): Secondary | ICD-10-CM

## 2022-10-03 DIAGNOSIS — D538 Other specified nutritional anemias: Secondary | ICD-10-CM | POA: Diagnosis not present

## 2022-10-03 DIAGNOSIS — R052 Subacute cough: Secondary | ICD-10-CM

## 2022-10-03 LAB — LIPID PANEL
Cholesterol: 160 mg/dL (ref 0–200)
HDL: 56.7 mg/dL (ref >39.00)
LDL Cholesterol: 84 mg/dL (ref 0–99)
NonHDL: 103.37
Total CHOL/HDL Ratio: 3
Triglycerides: 95 mg/dL (ref 0.0–149.0)
VLDL: 19 mg/dL (ref 0.0–40.0)

## 2022-10-03 LAB — CBC WITH DIFFERENTIAL/PLATELET
Basophils Absolute: 0.1 10*3/uL (ref 0.0–0.1)
Basophils Relative: 1 % (ref 0.0–3.0)
Eosinophils Absolute: 0.1 10*3/uL (ref 0.0–0.7)
Eosinophils Relative: 1.7 % (ref 0.0–5.0)
HCT: 34.7 % — ABNORMAL LOW (ref 36.0–46.0)
Hemoglobin: 11.3 g/dL — ABNORMAL LOW (ref 12.0–15.0)
Lymphocytes Relative: 29 % (ref 12.0–46.0)
Lymphs Abs: 1.8 10*3/uL (ref 0.7–4.0)
MCHC: 32.6 g/dL (ref 30.0–36.0)
MCV: 94.3 fl (ref 78.0–100.0)
Monocytes Absolute: 0.4 10*3/uL (ref 0.1–1.0)
Monocytes Relative: 6.6 % (ref 3.0–12.0)
Neutro Abs: 3.9 10*3/uL (ref 1.4–7.7)
Neutrophils Relative %: 61.7 % (ref 43.0–77.0)
Platelets: 243 10*3/uL (ref 150.0–400.0)
RBC: 3.68 Mil/uL — ABNORMAL LOW (ref 3.87–5.11)
RDW: 13.8 % (ref 11.5–15.5)
WBC: 6.3 10*3/uL (ref 4.0–10.5)

## 2022-10-03 LAB — HEPATIC FUNCTION PANEL
ALT: 10 U/L (ref 0–35)
AST: 22 U/L (ref 0–37)
Albumin: 4.1 g/dL (ref 3.5–5.2)
Alkaline Phosphatase: 61 U/L (ref 39–117)
Bilirubin, Direct: 0.1 mg/dL (ref 0.0–0.3)
Total Bilirubin: 0.5 mg/dL (ref 0.2–1.2)
Total Protein: 7.1 g/dL (ref 6.0–8.3)

## 2022-10-03 LAB — BASIC METABOLIC PANEL
BUN: 35 mg/dL — ABNORMAL HIGH (ref 6–23)
CO2: 25 mEq/L (ref 19–32)
Calcium: 10.9 mg/dL — ABNORMAL HIGH (ref 8.4–10.5)
Chloride: 104 mEq/L (ref 96–112)
Creatinine, Ser: 1.91 mg/dL — ABNORMAL HIGH (ref 0.40–1.20)
GFR: 25.55 mL/min — ABNORMAL LOW (ref >60.00)
Glucose, Bld: 99 mg/dL (ref 70–99)
Potassium: 3.9 mEq/L (ref 3.5–5.1)
Sodium: 139 mEq/L (ref 135–145)

## 2022-10-03 LAB — TSH: TSH: 1.34 u[IU]/mL (ref 0.35–5.50)

## 2022-10-03 NOTE — Progress Notes (Unsigned)
Subjective:  Patient ID: Cheryl [name], female    DOB: 11-05-1950  Age: 72 y.o. MRN: [id number]  CC: Hypertension, Hypothyroidism, and Hyperlipidemia   HPI Alesha Gerstel presents for f/up ----  Discussed the use of AI scribe software for clinical note transcription with the patient, who gave verbal consent to proceed.  History of Present Illness   The patient, with a TAA and murmur for fu/p on recent ECHO and recent cardiac event monitor. They were unsure of the reasons for these appointments but mentioned a known issue with a leaky valve. They denied experiencing chest pain, shortness of breath, or irregular heartbeats. However, they reported occasional dizziness or lightheadedness, particularly upon standing.  The patient also reported a consistently low heart rate, which was confirmed in previous visits. They expressed concerns about their balance, which has been deteriorating over time, leading to difficulty walking straight. This has necessitated rehabilitation in the past. Despite these issues, they denied any coughing or wheezing.       Outpatient Medications Prior to Visit  Medication Sig Dispense Refill   alendronate (FOSAMAX) 10 MG tablet Take 70 mg by mouth once a week. Take with a full glass of water on an empty stomach.     bimatoprost (LUMIGAN) 0.01 % SOLN at bedtime.     BIOTIN PO Take by mouth.     Levothyroxine Sodium (SYNTHROID PO) Take by mouth.     montelukast (SINGULAIR) 10 MG tablet Take 10 mg by mouth at bedtime.     Omega-3 Fatty Acids (FISH OIL PO) Take by mouth.     Rosuvastatin Calcium (CRESTOR PO) Take by mouth.     thiamine (VITAMIN B1) 100 MG tablet Take 1 tablet (100 mg total) by mouth daily. 90 tablet 1   METOPROLOL TARTRATE PO Take by mouth.     SPIRONOLACTONE PO Take by mouth.     No facility-administered medications prior to visit.    ROS Review of Systems  Constitutional: Negative.  Negative for chills, diaphoresis, fatigue and fever.   HENT: Negative.    Respiratory:  Positive for cough. Negative for chest tightness, shortness of breath and wheezing.   Cardiovascular:  Negative for chest pain, palpitations and leg swelling.  Gastrointestinal:  Negative for abdominal pain, constipation, diarrhea, nausea and vomiting.  Endocrine: Negative.   Genitourinary:  Negative for difficulty urinating.  Musculoskeletal: Negative.  Negative for arthralgias and myalgias.  Skin: Negative.   Allergic/Immunologic: Negative.   Neurological:  Positive for dizziness and light-headedness. Negative for syncope and speech difficulty.  Psychiatric/Behavioral:  Positive for confusion. Negative for suicidal ideas. The patient is not nervous/anxious.     Objective:  BP 112/66   Pulse (!) 56   Temp 97.6 F (36.4 C) (Oral)   Resp 16   Ht 5\' 4"  (1.626 m)   Wt 132 lb (59.9 kg)   SpO2 93%   BMI 22.66 kg/m   BP Readings from Last 3 Encounters:  10/03/22 112/66  07/31/22 124/64  04/03/22 122/70    Wt Readings from Last 3 Encounters:  10/03/22 132 lb (59.9 kg)  07/31/22 133 lb (60.3 kg)  04/03/22 133 lb (60.3 kg)    Physical Exam Vitals reviewed.  Constitutional:      General: She is not in acute distress.    Appearance: Normal appearance. She is not ill-appearing, toxic-appearing or diaphoretic.  HENT:     Mouth/Throat:     Mouth: Mucous membranes are moist.  Eyes:     General: No scleral  icterus.    Conjunctiva/sclera: Conjunctivae normal.  Cardiovascular:     Rate and Rhythm: Regular rhythm. Bradycardia present.     Heart sounds: Murmur heard.     No systolic murmur is present.     Diastolic murmur is present with a grade of 1/4.     No friction rub. No gallop.  Pulmonary:     Effort: Pulmonary effort is normal.     Breath sounds: No stridor. No wheezing, rhonchi or rales.  Abdominal:     General: Abdomen is flat.     Palpations: There is no mass.     Tenderness: There is no abdominal tenderness. There is no guarding.      Hernia: No hernia is present.  Musculoskeletal:        General: Normal range of motion.     Cervical back: Neck supple.     Right lower leg: No edema.     Left lower leg: No edema.  Lymphadenopathy:     Cervical: No cervical adenopathy.  Skin:    General: Skin is warm and dry.     Findings: No rash.  Neurological:     General: No focal deficit present.     Mental Status: She is alert. Mental status is at baseline.  Psychiatric:        Mood and Affect: Mood is anxious.        Speech: Speech is tangential.        Behavior: Behavior normal.        Thought Content: Thought content is not paranoid or delusional. Thought content does not include homicidal or suicidal ideation.     Lab Results  Component Value Date   WBC 6.3 10/03/2022   HGB 11.3 (L) 10/03/2022   HCT 34.7 (L) 10/03/2022   PLT 243.0 10/03/2022   GLUCOSE 99 10/03/2022   CHOL 160 10/03/2022   TRIG 95.0 10/03/2022   HDL 56.70 10/03/2022   LDLCALC 84 10/03/2022   ALT 10 10/03/2022   AST 22 10/03/2022   NA 139 10/03/2022   K 3.9 10/03/2022   CL 104 10/03/2022   CREATININE 1.91 (H) 10/03/2022   BUN 35 (H) 10/03/2022   CO2 25 10/03/2022   TSH 1.34 10/03/2022    US RENAL  Result Date: 04/08/2021 CLINICAL DATA:  Stage III B chronic kidney disease; sarcoidosis EXAM: RENAL / URINARY TRACT ULTRASOUND COMPLETE COMPARISON:  None. FINDINGS: Right Kidney: Renal measurements: 7.8 x 4.0 x 3.7 cm = volume: 60 mL. Diffusely echogenic renal parenchyma. Increased conspicuity of the corticomedullary interface. No discrete lesion. No hydronephrosis. Left Kidney: Renal measurements: 10.7 x 4.7 x 4.2 cm = volume: 112 mL. Diffusely echogenic renal parenchyma. Circumscribed anechoic simple cyst within imperceptible wall arising at the lower pole measures 3.1 x 2.2 x 2.7 cm. No hydronephrosis or solid nodule. Bladder: Appears normal for degree of bladder distention. Other: None. IMPRESSION: Small, echogenic kidneys consistent with  underlying medical renal disease. No evidence of hydronephrosis. 3.1 cm simple cyst in the lower pole of the left kidney. Electronically Signed   By: Malachy Moan M.D.   On: 04/08/2021 07:36    CARDIAC EVENT MONITOR  Result Date: 09/22/2022   Patient had a minimum heart rate of 46 bpm, maximum heart rate of 126 bpm, and average heart rate of 66 bpm.   Predominant underlying rhythm was sinus rhythm.   Four short runs of NSVT, longest lasting 7 beats.   No atrial fibrillation or atrial flutter   Isolated  PACs were rare (<1.0%).   Isolated PVCs were occasional (1.0%).   Triggered and diary events associated with sinus bradycardia, sinus rhythm, or PVCs. Occasional PVCs  ECHOCARDIOGRAM COMPLETE  Result Date: 09/19/2022    ECHOCARDIOGRAM REPORT   Patient Name:   NEARIAH LUYSTER Date of Exam: 09/19/2022 Medical Rec #:  [id number]        Height:       64.0 in Accession #:    [id number]       Weight:       133.0 lb Date of Birth:  18-Dec-1950        BSA:          1.645 m Patient Age:    72 years         BP:           124/64 mmHg Patient Gender: F                HR:           57 bpm. Exam Location:  Outpatient Procedure: 2D Echo, Cardiac Doppler, Color Doppler, Intracardiac Opacification            Agent and 3D Echo Indications:    R94.31 Abnormal EKG; R06.9 DOE; R53.83 Fatigue  History:        Patient has no prior history of Echocardiogram examinations.                 Abnormal ECG, Signs/Symptoms:Dyspnea and Fatigue; Risk                 Factors:Hypertension, CKD stage 4, Dyslipidemia and Non-Smoker.                 Patient denies chest pain, leg edema or any cardiac events. She                 does have a history of sarcoidosis with DOE and fatigue.  Sonographer:    Carlos American RVT, RDCS (AE), RDMS Referring Phys: [id number] [name] Grandchild  Sonographer Comments: Image acquisition challenging due to patient body habitus and Image acquisition challenging due to respiratory motion. IMPRESSIONS  1. Mild LV dysfunction  with apical aneurysm noted; there is swirling noted suggestive of low flow state but no obvious thrombus.  2. Left ventricular ejection fraction, by estimation, is 45 to 50%. The left ventricle has mildly decreased function. The left ventricle demonstrates regional wall motion abnormalities (see scoring diagram/findings for description). Left ventricular diastolic parameters are consistent with Grade I diastolic dysfunction (impaired relaxation).  3. Right ventricular systolic function is normal. The right ventricular size is normal.  4. Left atrial size was mildly dilated.  5. Right atrial size was mildly dilated.  6. The mitral valve is normal in structure. Trivial mitral valve regurgitation. No evidence of mitral stenosis.  7. The aortic valve is tricuspid. Aortic valve regurgitation is moderate. Aortic valve sclerosis is present, with no evidence of aortic valve stenosis.  8. The inferior vena cava is normal in size with greater than 50% respiratory variability, suggesting right atrial pressure of 3 mmHg. Comparison(s): No prior Echocardiogram. FINDINGS  Left Ventricle: Left ventricular ejection fraction, by estimation, is 45 to 50%. The left ventricle has mildly decreased function. The left ventricle demonstrates regional wall motion abnormalities. Definity contrast agent was given IV to delineate the left ventricular endocardial borders. The left ventricular internal cavity size was normal in size. There is no left ventricular hypertrophy. Left ventricular diastolic parameters are consistent with Grade  I diastolic dysfunction (impaired relaxation). Right Ventricle: The right ventricular size is normal. Right ventricular systolic function is normal. Left Atrium: Left atrial size was mildly dilated. Right Atrium: Right atrial size was mildly dilated. Pericardium: There is no evidence of pericardial effusion. Mitral Valve: The mitral valve is normal in structure. Trivial mitral valve regurgitation. No evidence of  mitral valve stenosis. Tricuspid Valve: The tricuspid valve is normal in structure. Tricuspid valve regurgitation is mild . No evidence of tricuspid stenosis. Aortic Valve: The aortic valve is tricuspid. Aortic valve regurgitation is moderate. Aortic regurgitation PHT measures 571 msec. Aortic valve sclerosis is present, with no evidence of aortic valve stenosis. Aortic valve mean gradient measures 3.0 mmHg. Aortic valve peak gradient measures 5.7 mmHg. Aortic valve area, by VTI measures 1.54 cm. Pulmonic Valve: The pulmonic valve was normal in structure. Pulmonic valve regurgitation is mild. No evidence of pulmonic stenosis. Aorta: The aortic root is normal in size and structure. Venous: The inferior vena cava is normal in size with greater than 50% respiratory variability, suggesting right atrial pressure of 3 mmHg. IAS/Shunts: The interatrial septum is aneurysmal. No atrial level shunt detected by color flow Doppler. Additional Comments: Mild LV dysfunction with apical aneurysm noted; there is swirling noted suggestive of low flow state but no obvious thrombus.  LEFT VENTRICLE PLAX 2D LVIDd:         4.62 cm   Diastology LVIDs:         3.48 cm   LV e' medial:    4.35 cm/s LV PW:         0.93 cm   LV E/e' medial:  11.1 LV IVS:        0.58 cm   LV e' lateral:   8.81 cm/s LVOT diam:     1.90 cm   LV E/e' lateral: 5.5 LV SV:         45 LV SV Index:   27 LVOT Area:     2.84 cm                           3D Volume EF:                          3D EF:        61 %                          LV EDV:       100 ml                          LV ESV:       39 ml                          LV SV:        61 ml RIGHT VENTRICLE RV S prime:     9.46 cm/s TAPSE (M-mode): 1.4 cm LEFT ATRIUM             Index        RIGHT ATRIUM           Index LA diam:        3.20 cm 1.95 cm/m   RA Area:     19.10 cm LA Vol (A2C):   50.9 ml 30.94 ml/m  RA Volume:   60.50  ml  36.78 ml/m LA Vol (A4C):   48.8 ml 29.67 ml/m LA Biplane Vol: 49.6 ml 30.15  ml/m  AORTIC VALVE                    PULMONIC VALVE AV Area (Vmax):    1.56 cm     PV Vmax:          0.83 m/s AV Area (Vmean):   1.57 cm     PV Peak grad:     2.8 mmHg AV Area (VTI):     1.54 cm     PR End Diast Vel: 6.40 msec AV Vmax:           119.00 cm/s AV Vmean:          78.200 cm/s AV VTI:            0.290 m AV Peak Grad:      5.7 mmHg AV Mean Grad:      3.0 mmHg LVOT Vmax:         65.50 cm/s LVOT Vmean:        43.200 cm/s LVOT VTI:          0.158 m LVOT/AV VTI ratio: 0.54 AI PHT:            571 msec AR Vena Contracta: 0.30 cm  AORTA Ao Root diam: 3.70 cm Ao Asc diam:  3.80 cm Ao Arch diam: 3.7 cm MITRAL VALVE               TRICUSPID VALVE MV Area (PHT): 1.37 cm    TR Peak grad:   31.4 mmHg MV Decel Time: 552 msec    TR Vmax:        280.00 cm/s MV E velocity: 48.20 cm/s MV A velocity: 77.60 cm/s  SHUNTS MV E/A ratio:  0.62        Systemic VTI:  0.16 m                            Systemic Diam: 1.90 cm Olga Millers MD Electronically signed by Olga Millers MD Signature Date/Time: 09/19/2022/3:00:00 PM    Final    DG Chest 2 View  Result Date: 07/31/2022 CLINICAL DATA:  Worsening cough for 1 month. History of sarcoidosis. EXAM: CHEST - 2 VIEW COMPARISON:  04/03/2022. FINDINGS: Cardiac silhouette normal in size. No mediastinal or hilar masses. Calcified mediastinal hilar lymph nodes are stable. No enlarged lymph nodes. Clear lungs.  No pleural effusion or pneumothorax. Skeletal structures are intact. IMPRESSION: No active cardiopulmonary disease. Electronically Signed   By: Amie Portland M.D.   On: 07/31/2022 12:18     Assessment & Plan:  Moderate aortic valve regurgitation  Hyperlipidemia LDL goal <70 -  LDL goal achieved. Doing well on the statin   -     Lipid panel; Future -     TSH; Future -     Hepatic function panel; Future  Chronic renal disease, stage 4, severely decreased glomerular filtration rate (GFR) between 15-29 mL/min/1.73 square meter (HCC)-  Renal function is stable. -      Basic metabolic panel; Future  Anemia due to acquired thiamine deficiency -     CBC with Differential/Platelet; Future  Hypoaldosteronism (HCC)-her blood pressure is very well-controlled. -     Basic metabolic panel; Future  Bradycardia  Sarcoidosis- Her calcium is elevated.  Will evaluate for sarcoid activity. -     DG  Chest 2 View; Future  Subacute cough -     DG Chest 2 View; Future  Hypercalcemia -     DG Chest 2 View; Future     Follow-up: Return in about 6 months (around 04/05/2023).  Sanda Linger, MD

## 2022-10-03 NOTE — Patient Instructions (Signed)
Bradycardia, Adult Bradycardia is a slower-than-normal heartbeat. A normal resting heart rate for an adult ranges from 60 to 100 beats per minute. With bradycardia, the resting heart rate is less than 60 beats per minute. Bradycardia can prevent enough oxygen from reaching certain areas of your body when you are active. It can be serious if it keeps enough oxygen from reaching your brain and other parts of your body. Bradycardia is not a problem for everyone. For some healthy adults, a slow resting heart rate is normal. What are the causes? This condition may be caused by: A problem with the heart, including: A problem with the heart's electrical system, such as a heart block. With a heart block, electrical signals between the chambers of the heart are partially or completely blocked, so they are not able to work as they should. A problem with the heart's natural pacemaker (sinus node). Heart disease. A heart attack. Heart damage. Lyme disease. A heart infection. A heart condition that is present at birth (congenital heart defect). Certain medicines that treat heart conditions. Certain conditions, such as hypothyroidism and obstructive sleep apnea. Problems with the balance of chemicals and other substances, like potassium, in the blood. Trauma. Radiation therapy. What increases the risk? You are more likely to develop this condition if you: Are age 65 or older. Have high blood pressure (hypertension), high cholesterol (hyperlipidemia), or diabetes. Drink heavily, use tobacco or nicotine products, or use drugs. What are the signs or symptoms? Symptoms of this condition include: Light-headedness. Feeling faint or fainting. Fatigue and weakness. Trouble with activity or exercise. Shortness of breath. Chest pain (angina). Drowsiness. Confusion. Dizziness. How is this diagnosed? This condition may be diagnosed based on: Your symptoms. Your medical history. A physical exam. During  the exam, your health care provider will listen to your heartbeat and check your pulse. To confirm the diagnosis, your health care provider may order tests, such as: Blood tests. An electrocardiogram (ECG). This test records the heart's electrical activity. The test can show how fast your heart is beating and whether the heartbeat is steady. A test in which you wear a portable device (event recorder or Holter monitor) to record your heart's electrical activity while you go about your day. An exercise test. How is this treated? Treatment for this condition depends on the cause of the condition and how severe your symptoms are. Treatment may involve: Treatment of the underlying condition. Changing your medicines or how much medicine you take. Having a small, battery-operated device called a pacemaker implanted under the skin. When bradycardia occurs, this device can be used to increase your heart rate and help your heart beat in a regular rhythm. Follow these instructions at home: Lifestyle Manage any health conditions that contribute to bradycardia as told by your health care provider. Follow a heart-healthy diet. A nutrition specialist (dietitian) can help educate you about healthy food options and changes. Follow an exercise program that is approved by your health care provider. Maintain a healthy weight. Try to reduce or manage your stress, such as with yoga or meditation. If you need help reducing stress, ask your health care provider. Do not use any products that contain nicotine or tobacco. These products include cigarettes, chewing tobacco, and vaping devices, such as e-cigarettes. If you need help quitting, ask your health care provider. Do not use illegal drugs. Alcohol use If you drink alcohol: Limit how much you have to: 0-1 drink a day for women who are not pregnant. 0-2 drinks a day   for men. Know how much alcohol is in a drink. In the [place] one drink equals one 12 oz bottle of  beer (355 mL), one 5 oz glass of wine (148 mL), or one 1 oz glass of hard liquor (44 mL). General instructions Take over-the-counter and prescription medicines only as told by your health care provider. Keep all follow-up visits. This is important. How is this prevented? In some cases, bradycardia may be prevented by: Treating underlying medical problems. Stopping behaviors or medicines that can trigger the condition. Contact a health care provider if: You feel light-headed or dizzy. You almost faint. You feel weak or are easily fatigued during physical activity. You experience confusion or have memory problems. Get help right away if: You faint. You have chest pains or an irregular heartbeat (palpitations). You have trouble breathing. These symptoms may represent a serious problem that is an emergency. Do not wait to see if the symptoms will go away. Get medical help right away. Call your local emergency services (911 in the [place] Do not drive yourself to the hospital. Summary Bradycardia is a slower-than-normal heartbeat. With bradycardia, the resting heart rate is less than 60 beats per minute. Treatment for this condition depends on the cause. Manage any health conditions that contribute to bradycardia as told by your health care provider. Do not use any products that contain nicotine or tobacco. These products include cigarettes, chewing tobacco, and vaping devices, such as e-cigarettes. Keep all follow-up visits. This is important. This information is not intended to replace advice given to you by your health care provider. Make sure you discuss any questions you have with your health care provider. Document Revised: 06/03/2020 Document Reviewed: 06/03/2020 Elsevier Patient Education  2024 [employer]  

## 2022-10-10 ENCOUNTER — Ambulatory Visit: Payer: Medicare HMO | Admitting: Internal Medicine

## 2022-10-10 ENCOUNTER — Ambulatory Visit (INDEPENDENT_AMBULATORY_CARE_PROVIDER_SITE_OTHER): Payer: Medicare HMO

## 2022-10-10 ENCOUNTER — Ambulatory Visit (INDEPENDENT_AMBULATORY_CARE_PROVIDER_SITE_OTHER): Payer: Medicare HMO | Admitting: *Deleted

## 2022-10-10 VITALS — BP 152/74 | Ht 64.0 in | Wt 132.0 lb

## 2022-10-10 VITALS — BP 124/60

## 2022-10-10 DIAGNOSIS — D869 Sarcoidosis, unspecified: Secondary | ICD-10-CM | POA: Diagnosis not present

## 2022-10-10 DIAGNOSIS — Z0000 Encounter for general adult medical examination without abnormal findings: Secondary | ICD-10-CM | POA: Diagnosis not present

## 2022-10-10 DIAGNOSIS — R052 Subacute cough: Secondary | ICD-10-CM | POA: Diagnosis not present

## 2022-10-10 DIAGNOSIS — I10 Essential (primary) hypertension: Secondary | ICD-10-CM

## 2022-10-10 MED ORDER — HYDRALAZINE HCL 25 MG PO TABS
25.0000 mg | ORAL_TABLET | Freq: Three times a day (TID) | ORAL | 0 refills | Status: DC
Start: 2022-10-10 — End: 2023-01-28

## 2022-10-10 NOTE — Addendum Note (Signed)
Addended by: [name] Grandchild on: 10/10/2022 10:26 AM   Modules accepted: Orders

## 2022-10-10 NOTE — Progress Notes (Signed)
Subjective:   Cheryl [name] is a 72 y.o. female who presents for Medicare Annual (Subsequent) preventive examination.  Visit Complete: Virtual  I connected with  Adiya Gudger on 10/10/22 by a audio enabled telemedicine application and verified that I am speaking with the correct person using two identifiers.  Patient Location: Home  Provider Location: Office/Clinic  I discussed the limitations of evaluation and management by telemedicine. The patient expressed understanding and agreed to proceed.  Patient Medicare AWV questionnaire was  not completed by the patient.  Review of Systems    Defer to PCP  Cardiac Risk Factors include: sedentary lifestyle;hypertension     Please see problem list for additional risk factors   Vital Signs: Vital signs are patient reported.  Objective:    Today's Vitals   10/10/22 1009 10/10/22 1023  BP: (!) 151/77 (!) 152/74  Weight: 132 lb (59.9 kg)   Height: 5\' 4"  (1.626 m)    Body mass index is 22.66 kg/m.     10/10/2022   10:20 AM 07/29/2021   10:15 AM 10/09/2016   10:32 AM 09/29/2016   12:57 PM 09/22/2016    1:05 PM 09/11/2016   11:16 AM 09/04/2016   11:21 AM  Advanced Directives  Does Patient Have a Medical Advance Directive? Yes No No No No No No  Type of Advance Directive Living will        Would patient like information on creating a medical advance directive?  No - Patient declined Yes (MAU/Ambulatory/Procedural Areas - Information given) Yes (MAU/Ambulatory/Procedural Areas - Information given) Yes (MAU/Ambulatory/Procedural Areas - Information given) Yes (MAU/Ambulatory/Procedural Areas - Information given) Yes (MAU/Ambulatory/Procedural Areas - Information given)    Current Medications (verified) Outpatient Encounter Medications as of 10/10/2022  Medication Sig   bimatoprost (LUMIGAN) 0.01 % SOLN at bedtime.   BIOTIN PO Take by mouth.   Levothyroxine Sodium (SYNTHROID PO) Take by mouth.   montelukast (SINGULAIR) 10 MG  tablet Take 10 mg by mouth at bedtime.   Omega-3 Fatty Acids (FISH OIL PO) Take by mouth.   Rosuvastatin Calcium (CRESTOR PO) Take by mouth.   thiamine (VITAMIN B1) 100 MG tablet Take 1 tablet (100 mg total) by mouth daily.   alendronate (FOSAMAX) 10 MG tablet Take 70 mg by mouth once a week. Take with a full glass of water on an empty stomach. (Patient not taking: Reported on 10/10/2022)   No facility-administered encounter medications on file as of 10/10/2022.    Allergies (verified) Erythromycin and Penicillins   History: Past Medical History:  Diagnosis Date   AAA (abdominal aortic aneurysm) (HCC)    Arthritis    osteoarthritis   Hyperlipidemia    Hypertension    Hypoaldosteronism (HCC)    Renal disorder    renal insufficiency   Sarcoidosis    Thyroid disease    hypothyroidism   Past Surgical History:  Procedure Laterality Date   ABDOMINAL AORTIC ANEURYSM REPAIR     BREAST BIOPSY     RENAL BIOPSY     renal vein sampling     History reviewed. No pertinent family history. Social History   Socioeconomic History   Marital status: Married    Spouse name: Not on file   Number of children: Not on file   Years of education: Not on file   Highest education level: Master's degree (e.g., MA, MS, MEng, MEd, MSW, MBA)  Occupational History   Not on file  Tobacco Use   Smoking status: Never    Passive  exposure: Never   Smokeless tobacco: Not on file  Substance and Sexual Activity   Alcohol use: No   Drug use: No   Sexual activity: Not Currently  Other Topics Concern   Not on file  Social History Narrative   Not on file   Social Determinants of Health   Financial Resource Strain: High Risk (10/10/2022)   Overall Financial Resource Strain (CARDIA)    Difficulty of Paying Living Expenses: Very hard  Food Insecurity: No Food Insecurity (10/10/2022)   Hunger Vital Sign    Worried About Running Out of Food in the Last Year: Never true    Ran Out of Food in the Last Year:  Never true  Transportation Needs: No Transportation Needs (10/10/2022)   PRAPARE - Administrator, Civil Service (Medical): No    Lack of Transportation (Non-Medical): No  Physical Activity: Inactive (10/10/2022)   Exercise Vital Sign    Days of Exercise per Week: 0 days    Minutes of Exercise per Session: 0 min  Stress: No Stress Concern Present (10/10/2022)   [employer] of Occupational Health - Occupational Stress Questionnaire    Feeling of Stress : Not at all  Social Connections: Moderately Integrated (10/10/2022)   Social Connection and Isolation Panel [NHANES]    Frequency of Communication with Friends and Family: Once a week    Frequency of Social Gatherings with Friends and Family: Twice a week    Attends Religious Services: More than 4 times per year    Active Member of [employer] or Organizations: No    Attends Engineer, structural: Never    Marital Status: Married    Tobacco Counseling Counseling given: Not Answered   Clinical Intake:  Pre-visit preparation completed: Yes  Pain : No/denies pain     Nutritional Status: BMI of 19-24  Normal Nutritional Risks: None Diabetes: No  How often do you need to have someone help you when you read instructions, pamphlets, or other written materials from your doctor or pharmacy?: 1 - Never What is the last grade level you completed in school?: masters degree  Interpreter Needed?: No      Activities of Daily Living    10/10/2022   10:13 AM 10/08/2022    9:01 PM  In your present state of health, do you have any difficulty performing the following activities:  Hearing? 0 0  Vision? 0 0  Difficulty concentrating or making decisions? 0 0  Walking or climbing stairs? 0 0  Dressing or bathing? 0 0  Doing errands, shopping? 0 0  Preparing Food and eating ? N N  Using the Toilet? N N  In the past six months, have you accidently leaked urine? N Y  Do you have problems with loss of bowel control? N N   Managing your Medications? N N  Housekeeping or managing your Housekeeping? N N    Patient Care Team: [name] Grandchild, MD as PCP - General (Internal Medicine)  Indicate any recent Medical Services you may have received from other than [facility] providers in the past year (date may be approximate).     Assessment:   This is a routine wellness examination for Adean.  Hearing/Vision screen Vision Screening - Comments:: Annual eye exam Dr Dione Booze  Dietary issues and exercise activities discussed:     Goals Addressed               This Visit's Progress     Patient Stated (pt-stated)  Patient wants to be healthy      Depression Screen    10/10/2022   10:18 AM 10/03/2022    2:13 PM 07/31/2022   10:54 AM 07/29/2021   10:15 AM 07/29/2021   10:14 AM 03/19/2021    8:57 AM  PHQ 2/9 Scores  PHQ - 2 Score 1 1 0 0 0 1  PHQ- 9 Score   0       Fall Risk    10/10/2022   10:15 AM 10/08/2022    9:01 PM 10/03/2022    2:13 PM 10/01/2022    9:36 PM 07/31/2022    8:00 PM  Fall Risk   Falls in the past year? 0 0 0 0 1  Number falls in past yr: 0 0 0 0 0  Injury with Fall? 0 0 0 0 0  Risk for fall due to : No Fall Risks  No Fall Risks    Follow up Falls evaluation completed  Falls evaluation completed      MEDICARE RISK AT HOME:  Medicare Risk at Home - 10/10/22 1018     Any stairs in or around the home? Yes    If so, are there any without handrails? Yes    Home free of loose throw rugs in walkways, pet beds, electrical cords, etc? Yes    Adequate lighting in your home to reduce risk of falls? Yes    Life alert? Yes    Use of a cane, walker or w/c? No    Grab bars in the bathroom? Yes    Shower chair or bench in shower? Yes    Elevated toilet seat or a handicapped toilet? No             TIMED UP AND GO:  Was the test performed?  No    Cognitive Function:        10/10/2022   10:20 AM  6CIT Screen  What Year? 0 points  What month? 0 points  What time? 0 points   Count back from 20 0 points  Months in reverse 0 points  Repeat phrase 0 points  Total Score 0 points    Immunizations Immunization History  Administered Date(s) Administered   Influenza-Unspecified 12/24/2021   Moderna SARS-COV2 Booster Vaccination 01/12/2020   Moderna Sars-Covid-2 Vaccination 04/02/2019, 05/03/2019, 12/26/2019   Pneumococcal Conjugate-13 11/08/2014   Pneumococcal Polysaccharide-23 01/04/2018   Zoster Recombinant(Shingrix) 11/23/2017, 03/26/2018    TDAP status: Due, Education has been provided regarding the importance of this vaccine. Advised may receive this vaccine at local pharmacy or Health Dept. Aware to provide a copy of the vaccination record if obtained from local pharmacy or Health Dept. Verbalized acceptance and understanding.  Flu Vaccine status: Due, Education has been provided regarding the importance of this vaccine. Advised may receive this vaccine at local pharmacy or Health Dept. Aware to provide a copy of the vaccination record if obtained from local pharmacy or Health Dept. Verbalized acceptance and understanding.  Pneumococcal vaccine status: Up to date  Covid-19 vaccine status: Completed vaccines  Qualifies for Shingles Vaccine? Yes   Zostavax completed Yes   Shingrix Completed?: Yes  Screening Tests Health Maintenance  Topic Date Due   DTaP/Tdap/Td (1 - Tdap) Never done   DEXA SCAN  Never done   COVID-19 Vaccine (5 - 2023-24 season) 10/25/2021   INFLUENZA VACCINE  09/25/2022   Fecal DNA (Cologuard)  06/10/2023 (Originally 05/15/2022)   Medicare Annual Wellness (AWV)  10/10/2023   MAMMOGRAM  12/21/2023  Pneumonia Vaccine 34+ Years old  Completed   Hepatitis C Screening  Completed   Zoster Vaccines- Shingrix  Completed   HPV VACCINES  Aged Out    Health Maintenance  Health Maintenance Due  Topic Date Due   DTaP/Tdap/Td (1 - Tdap) Never done   DEXA SCAN  Never done   COVID-19 Vaccine (5 - 2023-24 season) 10/25/2021   INFLUENZA  VACCINE  09/25/2022    Colorectal cancer screening: patient is due, she states she wanted to wait to get this test done.  Mammogram status: Completed 12/20/2021. Repeat every year  Bone Density status: Ordered 04/21/2022. Pt provided with contact info and advised to call to schedule appt.  Lung Cancer Screening: (Low Dose CT Chest recommended if Age 77-80 years, 20 pack-year currently smoking OR have quit w/in 15years.) does not qualify.   Lung Cancer Screening Referral: n/a  Additional Screening:  Hepatitis C Screening: does qualify; Completed 04/03/2022  Vision Screening: Recommended annual ophthalmology exams for early detection of glaucoma and other disorders of the eye. Is the patient up to date with their annual eye exam?  Yes  Who is the provider or what is the name of the office in which the patient attends annual eye exams? Dr. Dione Booze If pt is not established with a provider, would they like to be referred to a provider to establish care? No .   Dental Screening: Recommended annual dental exams for proper oral hygiene  Diabetic Foot Exam: patient is not a diabetic   Community Resource Referral / Chronic Care Management: CRR required this visit?  No   CCM required this visit?  No     Plan:     I have personally reviewed and noted the following in the patient's chart:   Medical and social history Use of alcohol, tobacco or illicit drugs  Current medications and supplements including opioid prescriptions. Patient is not currently taking opioid prescriptions. Functional ability and status Nutritional status Physical activity Advanced directives List of other physicians Hospitalizations, surgeries, and ER visits in previous 12 months Vitals Screenings to include cognitive, depression, and falls Referrals and appointments  In addition, I have reviewed and discussed with patient certain preventive protocols, quality metrics, and best practice recommendations. A  written personalized care plan for preventive services as well as general preventive health recommendations were provided to patient.     Tamela Oddi, CMA   10/10/2022   Non face to face 35 minutes    After Visit Summary: (Pick Up) Due to this being a telephonic visit, with patients personalized plan was offered to patient and patient has requested to Pick up at office.  Nurse Notes:  Ms. Haegele , Thank you for taking time to come for your Medicare Wellness Visit. I appreciate your ongoing commitment to your health goals. Please review the following plan we discussed and let me know if I can assist you in the future.   These are the goals we discussed:  Goals       Patient Stated (pt-stated)      Patient wants to be healthy        This is a list of the screening recommended for you and due dates:  Health Maintenance  Topic Date Due   DTaP/Tdap/Td vaccine (1 - Tdap) Never done   DEXA scan (bone density measurement)  Never done   COVID-19 Vaccine (5 - 2023-24 season) 10/25/2021   Flu Shot  09/25/2022   Cologuard (Stool DNA test)  06/10/2023*  Medicare Annual Wellness Visit  10/10/2023   Mammogram  12/21/2023   Pneumonia Vaccine  Completed   Hepatitis C Screening  Completed   Zoster (Shingles) Vaccine  Completed   HPV Vaccine  Aged Out  *Topic was postponed. The date shown is not the original due date.

## 2022-10-10 NOTE — Progress Notes (Unsigned)
Patient got her BP rechecked per providers request.

## 2022-10-10 NOTE — Patient Instructions (Addendum)
Cheryl [name] , Thank you for taking time to come for your Medicare Wellness Visit. I appreciate your ongoing commitment to your health goals. Please review the following plan we discussed and let me know if I can assist you in the future.   These are the goals we discussed:  Goals       Patient Stated (pt-stated)      Patient wants to be healthy        This is a list of the screening recommended for you and due dates:  Health Maintenance  Topic Date Due   DTaP/Tdap/Td vaccine (1 - Tdap) Never done   DEXA scan (bone density measurement)  Never done   COVID-19 Vaccine (5 - 2023-24 season) 10/25/2021   Flu Shot  09/25/2022   Cologuard (Stool DNA test)  06/10/2023*   Medicare Annual Wellness Visit  10/10/2023   Mammogram  12/21/2023   Pneumonia Vaccine  Completed   Hepatitis C Screening  Completed   Zoster (Shingles) Vaccine  Completed   HPV Vaccine  Aged Out  *Topic was postponed. The date shown is not the original due date.     Health Maintenance, Female Adopting a healthy lifestyle and getting preventive care are important in promoting health and wellness. Ask your health care provider about: The right schedule for you to have regular tests and exams. Things you can do on your own to prevent diseases and keep yourself healthy. What should I know about diet, weight, and exercise? Eat a healthy diet  Eat a diet that includes plenty of vegetables, fruits, low-fat dairy products, and lean protein. Do not eat a lot of foods that are high in solid fats, added sugars, or sodium. Maintain a healthy weight Body mass index (BMI) is used to identify weight problems. It estimates body fat based on height and weight. Your health care provider can help determine your BMI and help you achieve or maintain a healthy weight. Get regular exercise Get regular exercise. This is one of the most important things you can do for your health. Most adults should: Exercise for at least 150 minutes each  week. The exercise should increase your heart rate and make you sweat (moderate-intensity exercise). Do strengthening exercises at least twice a week. This is in addition to the moderate-intensity exercise. Spend less time sitting. Even light physical activity can be beneficial. Watch cholesterol and blood lipids Have your blood tested for lipids and cholesterol at 74 years of age, then have this test every 5 years. Have your cholesterol levels checked more often if: Your lipid or cholesterol levels are high. You are older than 74 years of age. You are at high risk for heart disease. What should I know about cancer screening? Depending on your health history and family history, you may need to have cancer screening at various ages. This may include screening for: Breast cancer. Cervical cancer. Colorectal cancer. Skin cancer. Lung cancer. What should I know about heart disease, diabetes, and high blood pressure? Blood pressure and heart disease High blood pressure causes heart disease and increases the risk of stroke. This is more likely to develop in people who have high blood pressure readings or are overweight. Have your blood pressure checked: Every 3-5 years if you are 80-36 years of age. Every year if you are 55 years old or older. Diabetes Have regular diabetes screenings. This checks your fasting blood sugar level. Have the screening done: Once every three years after age 52 if you are at  a normal weight and have a low risk for diabetes. More often and at a younger age if you are overweight or have a high risk for diabetes. What should I know about preventing infection? Hepatitis B If you have a higher risk for hepatitis B, you should be screened for this virus. Talk with your health care provider to find out if you are at risk for hepatitis B infection. Hepatitis C Testing is recommended for: Everyone born from [birth year] through 1967. Anyone with known risk factors for hepatitis  C. Sexually transmitted infections (STIs) Get screened for STIs, including gonorrhea and chlamydia, if: You are sexually active and are younger than 74 years of age. You are older than 74 years of age and your health care provider tells you that you are at risk for this type of infection. Your sexual activity has changed since you were last screened, and you are at increased risk for chlamydia or gonorrhea. Ask your health care provider if you are at risk. Ask your health care provider about whether you are at high risk for HIV. Your health care provider may recommend a prescription medicine to help prevent HIV infection. If you choose to take medicine to prevent HIV, you should first get tested for HIV. You should then be tested every 3 months for as long as you are taking the medicine. Pregnancy If you are about to stop having your period (premenopausal) and you may become pregnant, seek counseling before you get pregnant. Take 400 to 800 micrograms (mcg) of folic acid every day if you become pregnant. Ask for birth control (contraception) if you want to prevent pregnancy. Osteoporosis and menopause Osteoporosis is a disease in which the bones lose minerals and strength with aging. This can result in bone fractures. If you are 57 years old or older, or if you are at risk for osteoporosis and fractures, ask your health care provider if you should: Be screened for bone loss. Take a calcium or vitamin D supplement to lower your risk of fractures. Be given hormone replacement therapy (HRT) to treat symptoms of menopause. Follow these instructions at home: Alcohol use Do not drink alcohol if: Your health care provider tells you not to drink. You are pregnant, may be pregnant, or are planning to become pregnant. If you drink alcohol: Limit how much you have to: 0-1 drink a day. Know how much alcohol is in your drink. In the [place] one drink equals one 12 oz bottle of beer (355 mL), one 5 oz glass  of wine (148 mL), or one 1 oz glass of hard liquor (44 mL). Lifestyle Do not use any products that contain nicotine or tobacco. These products include cigarettes, chewing tobacco, and vaping devices, such as e-cigarettes. If you need help quitting, ask your health care provider. Do not use street drugs. Do not share needles. Ask your health care provider for help if you need support or information about quitting drugs. General instructions Schedule regular health, dental, and eye exams. Stay current with your vaccines. Tell your health care provider if: You often feel depressed. You have ever been abused or do not feel safe at home. Summary Adopting a healthy lifestyle and getting preventive care are important in promoting health and wellness. Follow your health care provider's instructions about healthy diet, exercising, and getting tested or screened for diseases. Follow your health care provider's instructions on monitoring your cholesterol and blood pressure. This information is not intended to replace advice given to you by your  health care provider. Make sure you discuss any questions you have with your health care provider. Document Revised: 07/02/2020 Document Reviewed: 07/02/2020 Elsevier Patient Education  2024 [employer].

## 2022-10-13 NOTE — Progress Notes (Signed)
Pt came in to have a BP check per providers request

## 2022-10-26 ENCOUNTER — Encounter (HOSPITAL_COMMUNITY): Payer: Self-pay | Admitting: *Deleted

## 2022-10-26 ENCOUNTER — Emergency Department (HOSPITAL_COMMUNITY)
Admission: EM | Admit: 2022-10-26 | Discharge: 2022-10-26 | Disposition: A | Discharge status: Home / Self Care | Payer: Medicare HMO | Attending: Emergency Medicine | Admitting: Emergency Medicine

## 2022-10-26 ENCOUNTER — Other Ambulatory Visit: Payer: Self-pay

## 2022-10-26 DIAGNOSIS — Z79899 Other long term (current) drug therapy: Secondary | ICD-10-CM | POA: Insufficient documentation

## 2022-10-26 DIAGNOSIS — M7989 Other specified soft tissue disorders: Secondary | ICD-10-CM | POA: Diagnosis present

## 2022-10-26 DIAGNOSIS — R600 Localized edema: Secondary | ICD-10-CM | POA: Diagnosis not present

## 2022-10-26 DIAGNOSIS — N189 Chronic kidney disease, unspecified: Secondary | ICD-10-CM | POA: Diagnosis not present

## 2022-10-26 MED ORDER — FUROSEMIDE 20 MG PO TABS: 20.0000 mg | ORAL_TABLET | Freq: Every day | ORAL | 0 refills | Status: DC

## 2022-10-26 MED ORDER — FUROSEMIDE 40 MG PO TABS: 20.0000 mg | ORAL_TABLET | Freq: Once | ORAL | Status: DC

## 2022-10-26 NOTE — ED Notes (Addendum)
3+ pitting edema in bilateral lower extremities, denies SOB, states she noticed swelling for about a week now, but has gotten worse

## 2022-10-26 NOTE — Discharge Instructions (Addendum)
Take Lasix once daily as prescribed. Please follow up with Dr. [name] in one week for recheck to insure improvement.   Return to the emergency department with any shortness of breath, chest pain or pain in the lower legs.

## 2022-10-26 NOTE — ED Provider Notes (Signed)
[facility] Provider Note   CSN: [id number] Arrival date & time: 10/26/22  1259     History  Chief Complaint  Patient presents with   Leg Swelling    Cheryl [name] is a 72 y.o. female.  Patient to ED for evaluation of bilateral lower extremity swelling that started last night and appears worse this morning. No history of similar symptoms. No pain. No chest discomfort or SOB. She states appointment with PCP (Dr. Sanda Linger) earlier this month where he changes her blood pressure medications but cannot provide detail of what medications.   The history is provided by the patient. No language interpreter was used.       Home Medications Prior to Admission medications   Medication Sig Start Date End Date Taking? Authorizing Provider  furosemide (LASIX) 20 MG tablet Take 1 tablet (20 mg total) by mouth daily for 3 days. 10/26/22 10/29/22 Yes Seichi Kaufhold, Melvenia Beam, PA-C  alendronate (FOSAMAX) 10 MG tablet Take 70 mg by mouth once a week. Take with a full glass of water on an empty stomach. Patient not taking: Reported on 10/10/2022    [provider]  bimatoprost (LUMIGAN) 0.01 % SOLN at bedtime.    [provider]  BIOTIN PO Take by mouth.    [provider]  hydrALAZINE (APRESOLINE) 25 MG tablet Take 1 tablet (25 mg total) by mouth 3 (three) times daily. 10/10/22   [name] Grandchild, MD  Levothyroxine Sodium (SYNTHROID PO) Take by mouth.    [provider]  montelukast (SINGULAIR) 10 MG tablet Take 10 mg by mouth at bedtime.    [provider]  Omega-3 Fatty Acids (FISH OIL PO) Take by mouth.    [provider]  Rosuvastatin Calcium (CRESTOR PO) Take by mouth.    [provider]  thiamine (VITAMIN B1) 100 MG tablet Take 1 tablet (100 mg total) by mouth daily. 01/04/22   [name] Grandchild, MD      Allergies    Erythromycin and Penicillins    Review of Systems   Review of  Systems  Physical Exam Updated Vital Signs BP (!) 164/70   Pulse 75   Temp 98.1 F (36.7 C)   Resp 13   Ht 5\' 4"  (1.626 m)   Wt 64.9 kg   SpO2 96%   BMI 24.55 kg/m  Physical Exam Vitals and nursing note reviewed.  Cardiovascular:     Rate and Rhythm: Normal rate and regular rhythm.  Pulmonary:     Effort: Pulmonary effort is normal.     Breath sounds: No wheezing, rhonchi or rales.  Musculoskeletal:     Comments: Bilateral LE swelling that is nonpitting from ankles to proximal lower legs. Distal pulses present/symmetric. No discoloration, warmth. Lower legs are non-tender.   Skin:    General: Skin is warm and dry.     Findings: No bruising or erythema.  Neurological:     Mental Status: She is oriented to person, place, and time.     Sensory: No sensory deficit.     ED Results / Procedures / Treatments   Labs (all labs ordered are listed, but only abnormal results are displayed) Labs Reviewed - No data to display  EKG None  Radiology No results found.  Procedures Procedures    Medications Ordered in ED Medications  furosemide (LASIX) tablet 20 mg (has no administration in time range)    ED Course/ Medical Decision Making/ A&P Clinical Course as  of 10/26/22 1521  Sun Oct 26, 2022  1449 Chart reviewed. Per PCP visit 10/03/22, patient with history of CKD (stable), sarcoid (stable), anemia (stable), valvular disease (referred to cardiology). Changed from Metoprolol and Spironolactone to Hydralazine 25 mg.  [SU]  1517 The patient is seen by Dr. Estell Harpin. Recommends low dose Lasix (20 mg) for the next 3 days and PCP follow up for recheck. Patient updated on plan. [SU]    Clinical Course User Index [SU] Elpidio Anis, PA-C                                 Medical Decision Making          Final Clinical Impression(s) / ED Diagnoses Final diagnoses:  Bilateral lower extremity edema    Rx / DC Orders ED Discharge Orders          Ordered    furosemide  (LASIX) 20 MG tablet  Daily        10/26/22 1519              Elpidio Anis, PA-C 10/26/22 1521    Bethann Berkshire, MD 10/28/22 1502

## 2022-10-26 NOTE — ED Triage Notes (Signed)
Pt with bilateral feet and leg swelling since mid week. + weight gain. Denies any SOB.

## 2022-10-28 ENCOUNTER — Telehealth: Payer: Self-pay

## 2022-10-28 NOTE — Transitions of Care (Post Inpatient/ED Visit) (Signed)
   10/28/2022  Name: Cheryl [name] MRN: [id number] DOB: March 21, 1950  Today's TOC FU Call Status: Today's TOC FU Call Status:: Successful TOC FU Call Completed TOC FU Call Complete Date: 10/28/22 Patient's Name and Date of Birth confirmed.  Transition Care Management Follow-up Telephone Call Date of Discharge: 10/26/22 [facility] Type of Discharge: Emergency Department Reason for ED Visit: Other: (edema in legs) How have you been since you were released from the hospital?: Better Any questions or concerns?: No  Items Reviewed: Did you receive and understand the discharge instructions provided?: No Medications obtained,verified, and reconciled?: Yes (Medications Reviewed) Any new allergies since your discharge?: No Dietary orders reviewed?: NA Do you have support at home?: Yes  Medications Reviewed Today: Medications Reviewed Today     Reviewed by Barb Merino, LPN (Licensed Practical Nurse) on 10/28/22 at 1523  Med List Status: <None>   Medication Order Taking? Sig Documenting Provider Last Dose Status Informant  alendronate (FOSAMAX) 10 MG tablet [id number] No Take 70 mg by mouth once a week. Take with a full glass of water on an empty stomach.  Patient not taking: Reported on 10/10/2022   [provider] Not Taking Active   bimatoprost (LUMIGAN) 0.01 % SOLN [id number] No at bedtime. [provider] Taking Active   BIOTIN PO [id number] No Take by mouth. [provider] Taking Active   furosemide (LASIX) 20 MG tablet [id number]  Take 1 tablet (20 mg total) by mouth daily for 3 days. Elpidio Anis, PA-C  Active   hydrALAZINE (APRESOLINE) 25 MG tablet [id number]  Take 1 tablet (25 mg total) by mouth 3 (three) times daily. [name] Grandchild, MD  Active   Levothyroxine Sodium (SYNTHROID PO) [id number] No Take by mouth. [provider] Taking Active   montelukast (SINGULAIR) 10 MG tablet [id number] No Take 10 mg by mouth at bedtime.  [provider] Taking Active   Omega-3 Fatty Acids (FISH OIL PO) [id number] No Take by mouth. [provider] Taking Active   Rosuvastatin Calcium (CRESTOR PO) [id number] No Take by mouth. [provider] Taking Active   thiamine (VITAMIN B1) 100 MG tablet [id number] No Take 1 tablet (100 mg total) by mouth daily. [name] Grandchild, MD Taking Active             Home Care and Equipment/Supplies: Were Home Health Services Ordered?: NA Any new equipment or medical supplies ordered?: NA  Functional Questionnaire: Do you need assistance with bathing/showering or dressing?: No Do you need assistance with meal preparation?: No Do you need assistance with eating?: No Do you have difficulty maintaining continence: No Do you need assistance with getting out of bed/getting out of a chair/moving?: No Do you have difficulty managing or taking your medications?: No  Follow up appointments reviewed: PCP Follow-up appointment confirmed?: Yes Date of PCP follow-up appointment?: 10/30/22 Follow-up Provider: [facility] Follow-up appointment confirmed?: NA Do you need transportation to your follow-up appointment?: No Do you understand care options if your condition(s) worsen?: Yes-patient verbalized understanding    SIGNATURE Elisha Ponder LPN [facility] AWV Team Direct dial:  [phone]

## 2022-10-30 ENCOUNTER — Encounter: Payer: Self-pay | Admitting: Family Medicine

## 2022-10-30 ENCOUNTER — Ambulatory Visit (INDEPENDENT_AMBULATORY_CARE_PROVIDER_SITE_OTHER): Payer: Medicare HMO | Admitting: Family Medicine

## 2022-10-30 VITALS — BP 128/70 | HR 50 | Temp 97.6°F | Ht 64.0 in | Wt 141.0 lb

## 2022-10-30 DIAGNOSIS — R600 Localized edema: Secondary | ICD-10-CM

## 2022-10-30 DIAGNOSIS — I152 Hypertension secondary to endocrine disorders: Secondary | ICD-10-CM | POA: Diagnosis not present

## 2022-10-30 NOTE — Patient Instructions (Signed)
Elevate your legs when sitting   Eat a low sodium diet

## 2022-10-30 NOTE — Progress Notes (Signed)
Subjective:     Patient ID: Cheryl [name], female    DOB: 1950-11-27, 72 y.o.   MRN: [id number]  Chief Complaint  Patient presents with   Edema    Swelling in legs and feet, went to ED last Sunday. Looking a lot better than it did, just following up    HPI  Discussed the use of AI scribe software for clinical note transcription with the patient, who gave verbal consent to proceed.  History of Present Illness         Recent ED visit for bilateral LE edema.  States her legs are back to baseline in the mornings. Took Lasix x 3 days.   Eats out and has diet fairly high in sodium but does not use salt.   She sits a lot during the day.       Health Maintenance Due  Topic Date Due   DTaP/Tdap/Td (1 - Tdap) Never done   DEXA SCAN  Never done   INFLUENZA VACCINE  09/25/2022   COVID-19 Vaccine (5 - 2023-24 season) 10/26/2022    Past Medical History:  Diagnosis Date   AAA (abdominal aortic aneurysm) (HCC)    Arthritis    osteoarthritis   Hyperlipidemia    Hypertension    Hypoaldosteronism (HCC)    Renal disorder    renal insufficiency   Sarcoidosis    Thyroid disease    hypothyroidism    Past Surgical History:  Procedure Laterality Date   ABDOMINAL AORTIC ANEURYSM REPAIR     BREAST BIOPSY     RENAL BIOPSY     renal vein sampling      History reviewed. No pertinent family history.  Social History   Socioeconomic History   Marital status: Married    Spouse name: Not on file   Number of children: Not on file   Years of education: Not on file   Highest education level: Master's degree (e.g., MA, MS, MEng, MEd, MSW, MBA)  Occupational History   Not on file  Tobacco Use   Smoking status: Never    Passive exposure: Never   Smokeless tobacco: Not on file  Substance and Sexual Activity   Alcohol use: No   Drug use: No   Sexual activity: Not Currently  Other Topics Concern   Not on file  Social History Narrative   Not on file   Social Determinants  of Health   Financial Resource Strain: High Risk (10/10/2022)   Overall Financial Resource Strain (CARDIA)    Difficulty of Paying Living Expenses: Very hard  Food Insecurity: No Food Insecurity (10/10/2022)   Hunger Vital Sign    Worried About Running Out of Food in the Last Year: Never true    Ran Out of Food in the Last Year: Never true  Transportation Needs: No Transportation Needs (10/10/2022)   PRAPARE - Administrator, Civil Service (Medical): No    Lack of Transportation (Non-Medical): No  Physical Activity: Inactive (10/10/2022)   Exercise Vital Sign    Days of Exercise per Week: 0 days    Minutes of Exercise per Session: 0 min  Stress: No Stress Concern Present (10/10/2022)   [employer] of Occupational Health - Occupational Stress Questionnaire    Feeling of Stress : Not at all  Social Connections: Moderately Integrated (10/10/2022)   Social Connection and Isolation Panel [NHANES]    Frequency of Communication with Friends and Family: Once a week    Frequency of Social Gatherings with Friends  and Family: Twice a week    Attends Religious Services: More than 4 times per year    Active Member of Clubs or Organizations: No    Attends Banker Meetings: Never    Marital Status: Married  Catering manager Violence: Not At Risk (10/10/2022)   Humiliation, Afraid, Rape, and Kick questionnaire    Fear of Current or Ex-Partner: No    Emotionally Abused: No    Physically Abused: No    Sexually Abused: No    Outpatient Medications Prior to Visit  Medication Sig Dispense Refill   bimatoprost (LUMIGAN) 0.01 % SOLN at bedtime.     BIOTIN PO Take by mouth.     hydrALAZINE (APRESOLINE) 25 MG tablet Take 1 tablet (25 mg total) by mouth 3 (three) times daily. 270 tablet 0   Levothyroxine Sodium (SYNTHROID PO) Take by mouth.     montelukast (SINGULAIR) 10 MG tablet Take 10 mg by mouth at bedtime.     Omega-3 Fatty Acids (FISH OIL PO) Take by mouth.      Rosuvastatin Calcium (CRESTOR PO) Take by mouth.     thiamine (VITAMIN B1) 100 MG tablet Take 1 tablet (100 mg total) by mouth daily. 90 tablet 1   alendronate (FOSAMAX) 10 MG tablet Take 70 mg by mouth once a week. Take with a full glass of water on an empty stomach. (Patient not taking: Reported on 10/10/2022)     furosemide (LASIX) 20 MG tablet Take 1 tablet (20 mg total) by mouth daily for 3 days. (Patient not taking: Reported on 10/30/2022) 3 tablet 0   No facility-administered medications prior to visit.    Allergies  Allergen Reactions   Erythromycin Hives   Penicillins Hives    Review of Systems  Constitutional:  Negative for chills and fever.  Respiratory:  Negative for cough and shortness of breath.   Cardiovascular:  Positive for leg swelling. Negative for chest pain and palpitations.  Gastrointestinal:  Negative for abdominal pain, constipation, diarrhea, nausea and vomiting.  Genitourinary:  Negative for dysuria, frequency and urgency.  Musculoskeletal:  Negative for falls.  Neurological:  Negative for dizziness and focal weakness.       Objective:    Physical Exam Constitutional:      General: She is not in acute distress.    Appearance: She is not ill-appearing.  Eyes:     Extraocular Movements: Extraocular movements intact.     Conjunctiva/sclera: Conjunctivae normal.  Cardiovascular:     Rate and Rhythm: Normal rate and regular rhythm.  Pulmonary:     Effort: Pulmonary effort is normal.     Breath sounds: Normal breath sounds.  Musculoskeletal:     Cervical back: Normal range of motion and neck supple.     Right lower leg: No edema.     Left lower leg: No edema.  Skin:    General: Skin is warm and dry.  Neurological:     General: No focal deficit present.     Mental Status: She is alert and oriented to person, place, and time.     Motor: No weakness.     Gait: Gait normal.  Psychiatric:        Mood and Affect: Mood normal.        Behavior: Behavior  normal.        Thought Content: Thought content normal.      BP 128/70 (BP Location: Left Arm, Patient Position: Sitting, Cuff Size: Normal)   Pulse (!) 50  Temp 97.6 F (36.4 C) (Temporal)   Ht 5\' 4"  (1.626 m)   Wt 141 lb (64 kg)   SpO2 98%   BMI 24.20 kg/m  Wt Readings from Last 3 Encounters:  10/30/22 141 lb (64 kg)  10/26/22 143 lb (64.9 kg)  10/10/22 132 lb (59.9 kg)       Assessment & Plan:   Problem List Items Addressed This Visit       Cardiovascular and Mediastinum   Hypertension   Other Visit Diagnoses     Bilateral lower extremity edema    -  Primary   Relevant Orders   Basic metabolic panel      BP controlled. LE edema resolved. Check BMP since she took Lasix x 3 days.  Discussed low sodium diet, elevating legs when sitting.   I am having Dula Cuyler maintain her Rosuvastatin Calcium (CRESTOR PO), Levothyroxine Sodium (SYNTHROID PO), BIOTIN PO, Omega-3 Fatty Acids (FISH OIL PO), bimatoprost, alendronate, montelukast, thiamine, hydrALAZINE, and furosemide.  No orders of the defined types were placed in this encounter.

## 2022-10-31 ENCOUNTER — Telehealth: Payer: Self-pay

## 2022-10-31 ENCOUNTER — Other Ambulatory Visit: Payer: Self-pay

## 2022-10-31 ENCOUNTER — Other Ambulatory Visit: Payer: Self-pay | Admitting: Family Medicine

## 2022-10-31 ENCOUNTER — Other Ambulatory Visit (INDEPENDENT_AMBULATORY_CARE_PROVIDER_SITE_OTHER): Payer: Medicare HMO

## 2022-10-31 ENCOUNTER — Encounter: Payer: Self-pay | Admitting: Radiology

## 2022-10-31 DIAGNOSIS — E875 Hyperkalemia: Secondary | ICD-10-CM

## 2022-10-31 DIAGNOSIS — E876 Hypokalemia: Secondary | ICD-10-CM

## 2022-10-31 LAB — BASIC METABOLIC PANEL
BUN: 17 mg/dL (ref 6–23)
BUN: 19 mg/dL (ref 6–23)
CO2: 31 meq/L (ref 19–32)
CO2: 30 mEq/L (ref 19–32)
Calcium: 9.7 mg/dL (ref 8.4–10.5)
Calcium: 9.9 mg/dL (ref 8.4–10.5)
Chloride: 103 meq/L (ref 96–112)
Chloride: 104 meq/L (ref 96–112)
Creatinine, Ser: 1.54 mg/dL — ABNORMAL HIGH (ref 0.40–1.20)
Creatinine, Ser: 1.57 mg/dL — ABNORMAL HIGH (ref 0.40–1.20)
GFR: 33.07 mL/min — ABNORMAL LOW (ref >60.00)
GFR: 32.31 mL/min — ABNORMAL LOW (ref >60.00)
Glucose, Bld: 65 mg/dL — ABNORMAL LOW (ref 70–99)
Glucose, Bld: 100 mg/dL — ABNORMAL HIGH (ref 70–99)
Potassium: 6.2 meq/L (ref 3.5–5.1)
Potassium: 2.9 meq/L — ABNORMAL LOW (ref 3.5–5.1)
Sodium: 142 meq/L (ref 135–145)
Sodium: 144 meq/L (ref 135–145)

## 2022-10-31 MED ORDER — POTASSIUM CHLORIDE CRYS ER 20 MEQ PO TBCR
20.0000 meq | EXTENDED_RELEASE_TABLET | Freq: Every day | ORAL | 0 refills | Status: AC
Start: 2022-10-31 — End: ?

## 2022-10-31 NOTE — Progress Notes (Signed)
Please call her and let her know that her potassium is low. I will send in a supplement and she should follow up next week to be seen and have it rechecked.

## 2022-10-31 NOTE — Progress Notes (Signed)
Tried calling pt could not leave message. Left a Mychart message for pt to call Monday to schedule appointment for follow up

## 2022-10-31 NOTE — Addendum Note (Signed)
Addended by: Marinus Maw on: 10/31/2022 12:07 PM   Modules accepted: Orders

## 2022-10-31 NOTE — Progress Notes (Signed)
She will come in today for STAT BMP to recheck potassium.

## 2022-10-31 NOTE — Telephone Encounter (Signed)
CRITICAL VALUE STICKER  CRITICAL VALUE: potassium 6.2  RECEIVER (on-site recipient of call): Dahlia Client  DATE & TIME NOTIFIED:  10/31/22 at 11:50 am  MESSENGER (representative from lab): Saa  Notified vickie and she would like pt to return to get lab drawn today. Called pt and she states she will come in later today to get them redrawn. Lab orders placed

## 2022-11-24 ENCOUNTER — Ambulatory Visit (INDEPENDENT_AMBULATORY_CARE_PROVIDER_SITE_OTHER): Payer: Medicare HMO | Admitting: Internal Medicine

## 2022-11-24 ENCOUNTER — Telehealth: Payer: Self-pay

## 2022-11-24 ENCOUNTER — Encounter: Payer: Self-pay | Admitting: Internal Medicine

## 2022-11-24 VITALS — BP 130/72 | HR 70 | Temp 98.0°F | Ht 64.0 in | Wt 137.0 lb

## 2022-11-24 DIAGNOSIS — Z23 Encounter for immunization: Secondary | ICD-10-CM | POA: Diagnosis not present

## 2022-11-24 DIAGNOSIS — E876 Hypokalemia: Secondary | ICD-10-CM

## 2022-11-24 DIAGNOSIS — I152 Hypertension secondary to endocrine disorders: Secondary | ICD-10-CM

## 2022-11-24 DIAGNOSIS — N184 Chronic kidney disease, stage 4 (severe): Secondary | ICD-10-CM

## 2022-11-24 DIAGNOSIS — E2740 Unspecified adrenocortical insufficiency: Secondary | ICD-10-CM | POA: Diagnosis not present

## 2022-11-24 DIAGNOSIS — E785 Hyperlipidemia, unspecified: Secondary | ICD-10-CM

## 2022-11-24 DIAGNOSIS — E2839 Other primary ovarian failure: Secondary | ICD-10-CM

## 2022-11-24 LAB — BASIC METABOLIC PANEL
BUN: 19 mg/dL (ref 6–23)
CO2: 30 meq/L (ref 19–32)
Calcium: 9.4 mg/dL (ref 8.4–10.5)
Chloride: 101 meq/L (ref 96–112)
Creatinine, Ser: 1.36 mg/dL — ABNORMAL HIGH (ref 0.40–1.20)
GFR: 38.37 mL/min — ABNORMAL LOW (ref >60.00)
Glucose, Bld: 86 mg/dL (ref 70–99)
Potassium: 2.6 meq/L — CL (ref 3.5–5.1)
Sodium: 144 meq/L (ref 135–145)

## 2022-11-24 LAB — MAGNESIUM: Magnesium: 1.5 mg/dL (ref 1.5–2.5)

## 2022-11-24 MED ORDER — POTASSIUM CHLORIDE CRYS ER 20 MEQ PO TBCR
20.0000 meq | EXTENDED_RELEASE_TABLET | Freq: Every day | ORAL | 0 refills | Status: DC
Start: 2022-11-24 — End: 2023-01-21

## 2022-11-24 MED ORDER — ROSUVASTATIN CALCIUM 10 MG PO TABS
10.0000 mg | ORAL_TABLET | Freq: Every day | ORAL | 1 refills | Status: DC
Start: 2022-11-24 — End: 2023-03-11

## 2022-11-24 MED ORDER — SPIRONOLACTONE 25 MG PO TABS: 25.0000 mg | ORAL_TABLET | Freq: Every day | ORAL | 0 refills | Status: DC

## 2022-11-24 NOTE — Patient Instructions (Signed)
Hypokalemia Hypokalemia means that the amount of potassium in the blood is lower than normal. Potassium is a mineral (electrolyte) that helps regulate the amount of fluid in the body. It also stimulates muscle tightening (contraction) and helps nerves work properly. Normally, most of the body's potassium is inside cells, and only a very small amount is in the blood. Because the amount in the blood is so small, minor changes to potassium levels in the blood can be life-threatening. What are the causes? This condition may be caused by: Antibiotic medicine. Diarrhea or vomiting. Taking too much of a medicine that helps you have a bowel movement (laxative) can cause diarrhea and lead to hypokalemia. Chronic kidney disease (CKD). Medicines that help the body get rid of excess fluid (diuretics). Eating disorders, such as anorexia or bulimia. Low magnesium levels in the body. Sweating a lot. What are the signs or symptoms? Symptoms of this condition include: Weakness. Constipation. Fatigue. Muscle cramps. Mental confusion. Skipped heartbeats or irregular heartbeat (palpitations). Tingling or numbness. How is this diagnosed? This condition is diagnosed with a blood test. How is this treated? This condition may be treated by: Taking potassium supplements. Adjusting the medicines that you take. Eating more foods that contain a lot of potassium. If your potassium level is very low, you may need to get potassium through an IV and be monitored in the hospital. Follow these instructions at home: Eating and drinking  Eat a healthy diet. A healthy diet includes fresh fruits and vegetables, whole grains, healthy fats, and lean proteins. If told, eat more foods that contain a lot of potassium. These include: Nuts, such as peanuts and pistachios. Seeds, such as sunflower seeds and pumpkin seeds. Peas, lentils, and lima beans. Whole grain and bran cereals and breads. Fresh fruits and vegetables,  such as apricots, avocado, bananas, cantaloupe, kiwi, oranges, tomatoes, asparagus, and potatoes. Juices, such as orange, tomato, and prune. Lean meats, including fish. Milk and milk products, such as yogurt. General instructions Take over-the-counter and prescription medicines only as told by your health care provider. This includes vitamins, natural food products, and supplements. Keep all follow-up visits. This is important. Contact a health care provider if: You have weakness that gets worse. You feel your heart pounding or racing. You vomit. You have diarrhea. You have diabetes and you have trouble keeping your blood sugar in your target range. Get help right away if: You have chest pain. You have shortness of breath. You have vomiting or diarrhea that lasts for more than 2 days. You faint. These symptoms may be an emergency. Get help right away. Call 911. Do not wait to see if the symptoms will go away. Do not drive yourself to the hospital. Summary Hypokalemia means that the amount of potassium in the blood is lower than normal. This condition is diagnosed with a blood test. Hypokalemia may be treated by taking potassium supplements, adjusting the medicines that you take, or eating more foods that are high in potassium. If your potassium level is very low, you may need to get potassium through an IV and be monitored in the hospital. This information is not intended to replace advice given to you by your health care provider. Make sure you discuss any questions you have with your health care provider. Document Revised: 10/25/2020 Document Reviewed: 10/25/2020 Elsevier Patient Education  2024 Elsevier Inc.  

## 2022-11-24 NOTE — Progress Notes (Unsigned)
Subjective:  Patient ID: Cheryl [name], female    DOB: 10/29/1950  Age: 72 y.o. MRN: [id number]  CC: No chief complaint on file.   HPI Jasmyn Nealis presents for f/up ----  Discussed the use of AI scribe software for clinical note transcription with the patient, who gave verbal consent to proceed.  History of Present Illness   The patient, with a history of LE edema, sarcoidosis, and hypokalemia, presents with persistent lower extremity swelling. Despite this, the swelling remains noticeable.  The patient also reports a recent episode of hypokalemia, after a five-day course of potassium supplements. She denies any muscle cramps or twitches associated with the low potassium.  In addition to the swelling and hypokalemia, the patient experiences shortness of breath and dizziness. However, she denies any cough or other symptoms related to their sarcoidosis.  The patient is currently on hydralazine for hypertension and expresses a desire to restart spironolactone, which she believes made her feel better. She denies any new medications and report no issues with her skin or any sarcoid skin symptoms.  The patient's primary concern at this time is the persistent swelling in her legs and feet, which she reports has improved since her emergency room visit but remains an issue.       Outpatient Medications Prior to Visit  Medication Sig Dispense Refill   alendronate (FOSAMAX) 10 MG tablet Take 70 mg by mouth once a week. Take with a full glass of water on an empty stomach.     bimatoprost (LUMIGAN) 0.01 % SOLN at bedtime.     BIOTIN PO Take by mouth.     hydrALAZINE (APRESOLINE) 25 MG tablet Take 1 tablet (25 mg total) by mouth 3 (three) times daily. 270 tablet 0   Levothyroxine Sodium (SYNTHROID PO) Take by mouth.     montelukast (SINGULAIR) 10 MG tablet Take 10 mg by mouth at bedtime.     Omega-3 Fatty Acids (FISH OIL PO) Take by mouth.     thiamine (VITAMIN B1) 100 MG tablet Take 1  tablet (100 mg total) by mouth daily. 90 tablet 1   potassium chloride SA (KLOR-CON M) 20 MEQ tablet Take 1 tablet (20 mEq total) by mouth daily. 5 tablet 0   Rosuvastatin Calcium (CRESTOR PO) Take by mouth.     furosemide (LASIX) 20 MG tablet Take 1 tablet (20 mg total) by mouth daily for 3 days. (Patient not taking: Reported on 10/30/2022) 3 tablet 0   No facility-administered medications prior to visit.    ROS Review of Systems  Psychiatric/Behavioral:  Positive for confusion and decreased concentration.     Objective:  BP 130/72 (BP Location: Right Arm, Patient Position: Sitting, Cuff Size: Large)   Pulse 70   Temp 98 F (36.7 C) (Oral)   Ht 5\' 4"  (1.626 m)   Wt 137 lb (62.1 kg)   SpO2 93%   BMI 23.52 kg/m   BP Readings from Last 3 Encounters:  11/24/22 130/72  10/30/22 128/70  10/26/22 (!) 164/70    Wt Readings from Last 3 Encounters:  11/24/22 137 lb (62.1 kg)  10/30/22 141 lb (64 kg)  10/26/22 143 lb (64.9 kg)    Physical Exam  Lab Results  Component Value Date   WBC 6.3 10/03/2022   HGB 11.3 (L) 10/03/2022   HCT 34.7 (L) 10/03/2022   PLT 243.0 10/03/2022   GLUCOSE 86 11/24/2022   CHOL 160 10/03/2022   TRIG 95.0 10/03/2022   HDL 56.70 10/03/2022  LDLCALC 84 10/03/2022   ALT 10 10/03/2022   AST 22 10/03/2022   NA 144 11/24/2022   K 2.6 (LL) 11/24/2022   CL 101 11/24/2022   CREATININE 1.36 (H) 11/24/2022   BUN 19 11/24/2022   CO2 30 11/24/2022   TSH 1.34 10/03/2022    No results found.  Assessment & Plan:  Estrogen deficiency -     DG Bone Density; Future  Hypokalemia -     Spironolactone; Take 1 tablet (25 mg total) by mouth daily.  Dispense: 90 tablet; Refill: 0 -     Basic metabolic panel; Future -     Magnesium; Future -     AMB Referral to Pharmacy Medication Management -     Potassium Chloride Crys ER; Take 1 tablet (20 mEq total) by mouth daily.  Dispense: 90 tablet; Refill: 0  Hypoaldosteronism (HCC) -     Spironolactone; Take 1  tablet (25 mg total) by mouth daily.  Dispense: 90 tablet; Refill: 0 -     Basic metabolic panel; Future -     AMB Referral to Pharmacy Medication Management  Hypertension due to endocrine disorder -     Basic metabolic panel; Future -     AMB Referral to Pharmacy Medication Management  Hyperlipidemia LDL goal <70 -     Rosuvastatin Calcium; Take 1 tablet (10 mg total) by mouth daily.  Dispense: 90 tablet; Refill: 1  Flu vaccine need -     Flu Vaccine Trivalent High Dose (Fluad)  Chronic renal disease, stage 4, severely decreased glomerular filtration rate (GFR) between 15-29 mL/min/1.73 square meter (HCC) -     Ambulatory referral to Nephrology     Follow-up: Return in about 3 months (around 02/23/2023).  Sanda Linger, MD

## 2022-11-24 NOTE — Telephone Encounter (Signed)
CRITICAL VALUE STICKER  CRITICAL VALUE: potassium 2.6  RECEIVER (on-site recipient of call): Devaeh Amadi, CMA  DATE & TIME NOTIFIED:  11/24/22 4.26pm  MESSENGER (representative from lab): Hope  MD NOTIFIED: PCP  TIME OF NOTIFICATION: 11/24/22 4.26pm

## 2022-11-25 ENCOUNTER — Telehealth: Payer: Self-pay

## 2022-11-25 NOTE — Progress Notes (Signed)
Care Guide Note  11/25/2022 Name: Daziah Schoenborn MRN: [id number] DOB: 1950-06-12  Referred by: [name] Grandchild, MD Reason for referral : Care Coordination (Outreach to schedule with Pharm d )   [name] is a 72 y.o. year old female who is a primary care patient of [name] Grandchild, MD. Kieona Castronova was referred to the pharmacist for assistance related to HTN.    Successful contact was made with the patient to discuss pharmacy services including being ready for the pharmacist to call at least 5 minutes before the scheduled appointment time, to have medication bottles and any blood sugar or blood pressure readings ready for review. The patient agreed to meet with the pharmacist via with the pharmacist via telephone visit on (date/time).  11/26/2022  Penne Lash, RMA Care Guide [facility]  [place], [place] [address] Direct Dial: [phone] [email or web address]

## 2022-11-26 ENCOUNTER — Other Ambulatory Visit: Payer: Medicare HMO | Admitting: Pharmacist

## 2022-11-26 DIAGNOSIS — E876 Hypokalemia: Secondary | ICD-10-CM

## 2022-11-26 NOTE — Progress Notes (Signed)
11/26/2022 Name: Cheryl [name] MRN: [id number] DOB: Dec 08, 1950  Chief Complaint  Patient presents with   Hypertension   Hypokalemia   Medication Management    Cheryl [name] is a 72 y.o. year old female who presented for a telephone visit.   They were referred to the pharmacist by their PCP for assistance in managing  HTN and hypokalemia .    Subjective:  Care Team: Primary Care Provider: [name] Grandchild, MD ; Next Scheduled Visit: 03/02/2023 Endocrinologist Dr. Leslie Dales; Next Scheduled Visit: not scheduled - endocrinologist retired, needs to establish with new endocrinologist Nephrologist: Dr. Ronalee Belts, last visit: 09/17/2022  Medication Access/Adherence  Current Pharmacy:  [id number] - [place], [place] - [address] AT [facility] [address] [place] [address] Phone: [phone] Fax: [phone]   Patient reports affordability concerns with their medications: No  Patient reports access/transportation concerns to their pharmacy: No  Patient reports adherence concerns with their medications:  No     Hypertension: Pt complains of fatigue since med changes in August Current medications: hydralazine 25 mg TID *She was previously on spironolactone 100 mg daily originally prescribed by endocrinologist for hypoaldosteronism. This was stopped 10/03/2022. ED provider prescribed 3 days of Lasix on 10/26/2022 for lower extremity edema, then patient had hypokalemia on lab work on 10/31/2022. She took 5 days of potassium chloride as prescribed. 11/24/22 BMP rechecked w/ hypokalemia  She has a full bottle of spironolactone 100 mg daily at home. She has not yet picked up potassium chloride but plans to today.   Objective:  No results found for: "HGBA1C"  Lab Results  Component Value Date   CREATININE 1.36 (H) 11/24/2022   BUN 19 11/24/2022   NA 144 11/24/2022   K 2.6 (LL) 11/24/2022   CL 101 11/24/2022   CO2 30 11/24/2022    Lab Results  Component  Value Date   CHOL 160 10/03/2022   HDL 56.70 10/03/2022   LDLCALC 84 10/03/2022   TRIG 95.0 10/03/2022   CHOLHDL 3 10/03/2022    Medications Reviewed Today     Reviewed by Bonita Quin, RPH (Pharmacist) on 11/26/22 at 1544  Med List Status: <None>   Medication Order Taking? Sig Documenting Provider Last Dose Status Informant  alendronate (FOSAMAX) 10 MG tablet [id number] Yes Take 70 mg by mouth once a week. Take with a full glass of water on an empty stomach. [provider] Taking Active   Apoaequorin (PREVAGEN PO) [id number] Yes Take 1 tablet by mouth daily. [provider] Taking Active Self  bimatoprost (LUMIGAN) 0.01 % SOLN [id number] Yes at bedtime. [provider] Taking Active   BIOTIN PO [id number] Yes Take by mouth. [provider] Taking Active   budesonide-formoterol (SYMBICORT) 80-4.5 MCG/ACT inhaler [id number] Yes Inhale 2 puffs into the lungs daily as needed (for shortness of breath). [provider] Taking Active   hydrALAZINE (APRESOLINE) 25 MG tablet [id number] Yes Take 1 tablet (25 mg total) by mouth 3 (three) times daily. [name] Grandchild, MD Taking Active   Levothyroxine Sodium (SYNTHROID PO) [id number] Yes Take by mouth. [provider] Taking Active   Omega-3 Fatty Acids (FISH OIL PO) [id number] Yes Take by mouth. [provider] Taking Active   potassium chloride SA (KLOR-CON M) 20 MEQ tablet [id number] No Take 1 tablet (20 mEq total) by mouth daily.  Patient not taking: Reported on 11/26/2022   [name] Grandchild, MD Not Taking Active   rosuvastatin (CRESTOR) 10 MG tablet [id number] Yes Take  1 tablet (10 mg total) by mouth daily. [name] Grandchild, MD Taking Active   spironolactone (ALDACTONE) 25 MG tablet [id number] No Take 1 tablet (25 mg total) by mouth daily.  Patient not taking: Reported on 11/26/2022   [name] Grandchild, MD Not Taking Active   thiamine (VITAMIN B1) 100 MG tablet [id number] Yes Take 1 tablet (100  mg total) by mouth daily. [name] Grandchild, MD Taking Active               Assessment/Plan:  - Recommended patient restart spironolactone at 25 mg daily and start potassium chloride tomorrow AM. BMP walk-in lab 10/10 or 10/11 to recheck potassium. Pt may need up titration of spironolactone back to previous dose of 100 mg for hypoaldosteronism. - Nephrology referral was sent however pt already has a nephrologist ([facility]. [facility]) she has been following with for several years, last year f/u was 09/17/22, pt reported to be stable - Encouraged patient to call endocrinologist office to get appt scheduled with new provider to keep care ongoing for hypoaldosteronism    Follow Up Plan: Labs 10/10 or 10/11, telephone f/u 10/11 at 3:30 PM  Arbutus Leas, PharmD, BCPS [facility] [phone]

## 2022-11-26 NOTE — Patient Instructions (Addendum)
It was a pleasure speaking with you today!  Please start spironolactone 25 mg daily and potassium chloride 20 mEQ daily. We will recheck your potassium level next week on 10/10 or 10/11.  Continue follow up with kidney doctor as scheduled with Dr. Ronalee Belts.  I recommend contacting your endocrinologist office to establish care with a new endocrinologist to continue follow up for your hypoaldosteronism.  I will give you a call for follow up 10/11 at 3:30 PM.  Arbutus Leas, PharmD, BCPS [facility] [phone]

## 2022-12-04 ENCOUNTER — Other Ambulatory Visit (INDEPENDENT_AMBULATORY_CARE_PROVIDER_SITE_OTHER): Payer: Medicare HMO

## 2022-12-04 ENCOUNTER — Ambulatory Visit: Payer: Medicare HMO | Admitting: Internal Medicine

## 2022-12-04 DIAGNOSIS — E876 Hypokalemia: Secondary | ICD-10-CM

## 2022-12-04 LAB — BASIC METABOLIC PANEL
BUN: 15 mg/dL (ref 6–23)
CO2: 29 meq/L (ref 19–32)
Calcium: 9.6 mg/dL (ref 8.4–10.5)
Chloride: 105 meq/L (ref 96–112)
Creatinine, Ser: 1.25 mg/dL — ABNORMAL HIGH (ref 0.40–1.20)
GFR: 42.45 mL/min — ABNORMAL LOW (ref >60.00)
Glucose, Bld: 89 mg/dL (ref 70–99)
Potassium: 3.1 meq/L — ABNORMAL LOW (ref 3.5–5.1)
Sodium: 144 meq/L (ref 135–145)

## 2022-12-05 ENCOUNTER — Other Ambulatory Visit: Payer: Medicare HMO | Admitting: Pharmacist

## 2022-12-05 DIAGNOSIS — E876 Hypokalemia: Secondary | ICD-10-CM

## 2022-12-05 DIAGNOSIS — E2740 Unspecified adrenocortical insufficiency: Secondary | ICD-10-CM

## 2022-12-05 MED ORDER — SPIRONOLACTONE 25 MG PO TABS
50.0000 mg | ORAL_TABLET | Freq: Every day | ORAL | 0 refills | Status: DC
Start: 2022-12-05 — End: 2023-01-21

## 2022-12-05 NOTE — Patient Instructions (Signed)
It was a pleasure speaking with you today!  Increase your spironolactone to 50 mg (2 tablets of 25 mg) once daily and continue taking the potassium.  I will follow up with you on 11/5 after you see cardiology on 11/4.  Give me a call if you experience dizziness or weakness that is unusual or worsened.  Arbutus Leas, PharmD, BCPS [facility] [phone]

## 2022-12-05 NOTE — Progress Notes (Signed)
12/05/2022 Name: Cheryl [name] MRN: [id number] DOB: 06/25/1950  No chief complaint on file.   Cheryl [name] is a 72 y.o. year old female who presented for a telephone visit.   They were referred to the pharmacist by their PCP for assistance in managing  HTN and hypokalemia .   Subjective:  Care Team: Primary Care Provider: [name] Grandchild, MD ; Next Scheduled Visit: 03/02/2023 Endocrinologist Dr. Leslie Dales; Next Scheduled Visit: not scheduled - endocrinologist retired, needs to establish with new endocrinologist Nephrologist: Dr. Ronalee Belts, last visit: 09/17/2022 Cardiologist: Dr. Anne Fu, Next visit 12/29/2022  Medication Access/Adherence  Current Pharmacy:  [id number] - [place], [place] - [address] AT [facility] [address] [place] [address] Phone: [phone] Fax: [phone]   Patient reports affordability concerns with their medications: No  Patient reports access/transportation concerns to their pharmacy: No  Patient reports adherence concerns with their medications:  No     Hypertension: Pt complains of fatigue since med changes in August  - she notes she seems to feel somewhat better lately Current medications: hydralazine 25 mg TID, spironolactone 25 mg daily, potassium chloride 20 mEq daily *She was previously on spironolactone 100 mg daily originally prescribed by endocrinologist for hypoaldosteronism. This was stopped 10/03/2022. ED provider prescribed 3 days of Lasix on 10/26/2022 for lower extremity edema, then patient had hypokalemia on lab work on 10/31/2022. She took 5 days of potassium chloride as prescribed.   She has a full bottle of spironolactone 100 mg daily at home.   Objective:    Latest Ref Rng & Units 12/04/2022   11:56 AM 11/24/2022    2:21 PM 10/31/2022    3:40 PM  BMP  Glucose 70 - 99 mg/dL 89  86  951   BUN 6 - 23 mg/dL 15  19  19    Creatinine 0.40 - 1.20 mg/dL 8.84  1.66  0.63   Sodium 135 - 145 mEq/L 144  144  144    Potassium 3.5 - 5.1 mEq/L 3.1  2.6  2.9   Chloride 96 - 112 mEq/L 105  101  104   CO2 19 - 32 mEq/L 29  30  30    Calcium 8.4 - 10.5 mg/dL 9.6  9.4  9.9      Medications Reviewed Today   Medications were not reviewed in this encounter       Assessment/Plan:  - Recommended patient increase spironolactone to 50 mg daily and continue potassium. Pt may need up titration of spironolactone back to previous dose of 100 mg for hypoaldosteronism. - Encouraged patient to call endocrinologist office to get appt scheduled with new provider to keep care ongoing for hypoaldosteronism  - Recommended to monitor BP at home  - She has cardio appt coming up on 11/4. Will talk to her 11/5 to see how BP is doing and if they checked BMET   Follow Up Plan: telephone f/u 11/5  Cheryl [name], PharmD, BCPS [facility] [phone]

## 2022-12-16 ENCOUNTER — Encounter: Payer: Self-pay | Admitting: Cardiovascular Disease

## 2022-12-16 ENCOUNTER — Ambulatory Visit: Payer: Medicare HMO | Attending: Cardiovascular Disease | Admitting: Cardiovascular Disease

## 2022-12-16 VITALS — BP 132/56 | HR 78 | Ht 64.0 in | Wt 127.6 lb

## 2022-12-16 DIAGNOSIS — R072 Precordial pain: Secondary | ICD-10-CM

## 2022-12-16 DIAGNOSIS — E2740 Unspecified adrenocortical insufficiency: Secondary | ICD-10-CM

## 2022-12-16 DIAGNOSIS — I253 Aneurysm of heart: Secondary | ICD-10-CM

## 2022-12-16 DIAGNOSIS — I10 Essential (primary) hypertension: Secondary | ICD-10-CM | POA: Diagnosis not present

## 2022-12-16 DIAGNOSIS — I351 Nonrheumatic aortic (valve) insufficiency: Secondary | ICD-10-CM

## 2022-12-16 DIAGNOSIS — I422 Other hypertrophic cardiomyopathy: Secondary | ICD-10-CM

## 2022-12-16 DIAGNOSIS — E785 Hyperlipidemia, unspecified: Secondary | ICD-10-CM

## 2022-12-16 NOTE — Progress Notes (Signed)
CARDIOLOGY CONSULT NOTE       Patient ID: Cheryl [name] MRN: [id number] DOB/AGE: 74-Jul-1952 74 y.o.  Admit date: (Not on file) Referring Physician: [name] Primary Physician: [name] Grandchild, MD Primary Cardiologist: New Reason for Consultation: AV dx  Active Problems:   * No active hospital problems. *   HPI:  72 y.o. referred by Dr [name] for aortic valve dx. History of AAA, HLD, HTN Sarcoid and hypothyroidism. She has had chronic LE edema with issues low K She does have some exertional dyspnea No chest pain Systolic BP has been up diastolic not low. Aldactone added back by primary 11/24/22 Renal function is abnormal with CR 1.57 to 1.25 BNP minimally elevated at 272 She has not had any imaging of her LE venous system   Review of her TTE done 09/19/22 showed unusual apical aneurysm as well has moderate AR in setting of tri leaflet AV and ascending aorta of 3.8 cm   She has had a repair of descending thoracic aneurysm by Dr Laneta Simmers in 2012  Last [place] 2017 no residual aneurysm  She has no dyspnea or chest pain. Has not had CAD documented. LE edema mild and improved on aldactone She is married with one older son in [place] with 3 grand kids. 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 No pertinent family history.  Social History   Socioeconomic History   Marital status: Married    Spouse name: Not on file   Number of children: Not on file   Years of education: Not on file   Highest education level: Master's degree (e.g., MA, MS, MEng, MEd, MSW, MBA)  Occupational  History   Not on file  Tobacco Use   Smoking status: Never    Passive exposure: Never   Smokeless tobacco: Not on file  Substance and Sexual Activity   Alcohol use: No   Drug use: No   Sexual activity: Not Currently  Other Topics Concern   Not on file  Social History Narrative   Not on file   Social Determinants of Health   Financial Resource Strain: High Risk (10/10/2022)   Overall Financial Resource Strain (CARDIA)    Difficulty of Paying Living Expenses: Very hard  Food Insecurity: No Food Insecurity (10/10/2022)   Hunger Vital Sign    Worried About Running Out of Food in the Last Year: Never true    Ran Out of Food in the Last Year: Never true  Transportation Needs: No Transportation Needs (10/10/2022)   PRAPARE - Administrator, Civil Service (Medical): No    Lack of Transportation (Non-Medical): No  Physical Activity: Inactive (10/10/2022)   Exercise Vital Sign    Days of Exercise per Week: 0 days    Minutes of Exercise per Session: 0 min  Stress: No Stress Concern Present (10/10/2022)   [employer] of Occupational Health - Occupational Stress Questionnaire    Feeling of Stress : Not at all  Social Connections: Moderately Integrated (10/10/2022)   Social Connection and Isolation  Panel [NHANES]    Frequency of Communication with Friends and Family: Once a week    Frequency of Social Gatherings with Friends and Family: Twice a week    Attends Religious Services: More than 4 times per year    Active Member of [employer] or Organizations: No    Attends Banker Meetings: Never    Marital Status: Married  Catering manager Violence: Not At Risk (10/10/2022)   Humiliation, Afraid, Rape, and Kick questionnaire    Fear of Current or Ex-Partner: No    Emotionally Abused: No    Physically Abused: No    Sexually Abused: No    Past Surgical History:  Procedure Laterality Date   ABDOMINAL AORTIC ANEURYSM REPAIR     BREAST BIOPSY     RENAL BIOPSY      renal vein sampling        Current Outpatient Medications:    alendronate (FOSAMAX) 10 MG tablet, Take 70 mg by mouth once a week. Take with a full glass of water on an empty stomach., Disp: , Rfl:    Apoaequorin (PREVAGEN PO), Take 1 tablet by mouth daily., Disp: , Rfl:    bimatoprost (LUMIGAN) 0.01 % SOLN, at bedtime., Disp: , Rfl:    BIOTIN PO, Take by mouth., Disp: , Rfl:    budesonide-formoterol (SYMBICORT) 80-4.5 MCG/ACT inhaler, Inhale 2 puffs into the lungs daily as needed (for shortness of breath)., Disp: , Rfl:    hydrALAZINE (APRESOLINE) 25 MG tablet, Take 1 tablet (25 mg total) by mouth 3 (three) times daily., Disp: 270 tablet, Rfl: 0   Levothyroxine Sodium (SYNTHROID PO), Take by mouth., Disp: , Rfl:    Omega-3 Fatty Acids (FISH OIL PO), Take by mouth., Disp: , Rfl:    potassium chloride SA (KLOR-CON M) 20 MEQ tablet, Take 1 tablet (20 mEq total) by mouth daily., Disp: 90 tablet, Rfl: 0   rosuvastatin (CRESTOR) 10 MG tablet, Take 1 tablet (10 mg total) by mouth daily., Disp: 90 tablet, Rfl: 1   spironolactone (ALDACTONE) 25 MG tablet, Take 2 tablets (50 mg total) by mouth daily., Disp: 90 tablet, Rfl: 0   thiamine (VITAMIN B1) 100 MG tablet, Take 1 tablet (100 mg total) by mouth daily., Disp: 90 tablet, Rfl: 1    Physical Exam: Blood pressure (!) 132/56, pulse 78, height 5\' 4"  (1.626 m), weight 127 lb 9.6 oz (57.9 kg), SpO2 98%.    Affect appropriate Healthy:  appears stated age HEENT: normal Neck supple with no adenopathy JVP normal no bruits no thyromegaly Lungs clear with no wheezing and good diaphragmatic motion Heart:  S1/S2 SEM and AR  murmur, no rub, gallop or click PMI normal left lateral thoracotomy scar  Abdomen: benighn, BS positve, no tenderness, no AAA no bruit.  No HSM or HJR Distal pulses intact with no bruits No edema Neuro non-focal Skin warm and dry No muscular weakness   Labs:   Lab Results  Component Value Date   WBC 6.3 10/03/2022   HGB  11.3 (L) 10/03/2022   HCT 34.7 (L) 10/03/2022   MCV 94.3 10/03/2022   PLT 243.0 10/03/2022   No results for input(s): "NA", "K", "CL", "CO2", "BUN", "CREATININE", "CALCIUM", "PROT", "BILITOT", "ALKPHOS", "ALT", "AST", "GLUCOSE" in the last 168 hours.  Invalid input(s): "LABALBU" Lab Results  Component Value Date   CKTOTAL 169 07/31/2022    Lab Results  Component Value Date   CHOL 160 10/03/2022   CHOL 125 01/02/2022   CHOL 138 11/26/2020  Lab Results  Component Value Date   HDL 56.70 10/03/2022   HDL 57.60 01/02/2022   HDL 54 11/26/2020   Lab Results  Component Value Date   LDLCALC 84 10/03/2022   LDLCALC 54 01/02/2022   LDLCALC 71 11/26/2020   Lab Results  Component Value Date   TRIG 95.0 10/03/2022   TRIG 65.0 01/02/2022   TRIG 74 11/26/2020   Lab Results  Component Value Date   CHOLHDL 3 10/03/2022   CHOLHDL 2 01/02/2022   No results found for: "LDLDIRECT"    Radiology: No results found.  EKG: SR inferior lateral T wave inversions    ASSESSMENT AND PLAN:   AV dx:  moderate AR should not be causing symptoms  Diastolic pressures not low BP control with aldactone and hydralazine f/u echo in a year LV compensated and not enlarged Apical Anueurysm with abnormal ECG suggested possible burnt out apical hypertorphic DCM.  Will order cardiac MRI to further assess EF, r/o thrombus and assess etiology will order PET/CT to further assess for CAD as etiology with ? Distal LAD infarct  HTN:  see above  HLD continue statin  Descending Thoracic Aneurysm:  f/u [facility] can image with cardiac MRI   Cardiac MRI PET/CT  F/U post testing  Signed: Charlton Haws 12/16/2022, 10:40 AM

## 2022-12-16 NOTE — Patient Instructions (Addendum)
Medication Instructions:  Your physician recommends that you continue on your current medications as directed. Please refer to the Current Medication list given to you today.  *If you need a refill on your cardiac medications before your next appointment, please call your pharmacy*   Lab Work: Your physician recommends that you have lab work- CBC If you have labs (blood work) drawn today and your tests are completely normal, you will receive your results only by: MyChart Message (if you have MyChart) OR A paper copy in the mail If you have any lab test that is abnormal or we need to change your treatment, we will call you to review the results.  Testing/Procedures: Your physician has requested that you have cardiac PET CT. Cardiac computed tomography (CT) is a painless test that uses an x-ray machine to take clear, detailed pictures of your heart. For further information please visit [email or web address] Please follow instruction sheet as given.  Your physician has requested that you have a cardiac MRI. Cardiac MRI uses a computer to create images of your heart as its beating, producing both still and moving pictures of your heart and major blood vessels. For further information please visit [email or web address]. Please follow the instruction sheet given to you today for more information.   Follow-Up: At [facility], you and your health needs are our priority.  As part of our continuing mission to provide you with exceptional heart care, we have created designated Provider Care Teams.  These Care Teams include your primary Cardiologist (physician) and Advanced Practice Providers (APPs -  Physician Assistants and Nurse Practitioners) who all work together to provide you with the care you need, when you need it.  We recommend signing up for the patient portal called "MyChart".  Sign up information is provided on this After Visit Summary.  MyChart is used to connect with patients for  Virtual Visits (Telemedicine).  Patients are able to view lab/test results, encounter notes, upcoming appointments, etc.  Non-urgent messages can be sent to your provider as well.   To learn more about what you can do with MyChart, go to [email or web address].    Your next appointment:   Next available after test complete  Provider:   Dr. Eden Emms    Other Instructions How to Prepare for Your Cardiac PET/CT Stress Test:  1. Please do not take these medications before your test:   Your remaining medications may be taken with water.  2. Nothing to eat or drink, except water, 3 hours prior to arrival time.   NO caffeine/decaffeinated products, or chocolate 12 hours prior to arrival.  3. NO perfume, cologne or lotion on chest or abdomen area.          - FEMALES - Please avoid wearing dresses to this appointment.  4. Total time is 1 to 2 hours; you may want to bring reading material for the waiting time.  5. Please report to Radiology at the [facility] Main Entrance 30 minutes early for your test.  [address] [place], [place] [address]   IF YOU THINK YOU MAY BE PREGNANT, OR ARE NURSING PLEASE INFORM THE TECHNOLOGIST.  In preparation for your appointment, medication and supplies will be purchased.  Appointment availability is limited, so if you need to cancel or reschedule, please call the Radiology Department at [phone] [facility]) OR [phone] [facility])  24 hours in advance to avoid a cancellation fee of $100.00  What to Expect After you Arrive:  Once you arrive and  check in for your appointment, you will be taken to a preparation room within the Radiology Department.  A technologist or Nurse will obtain your medical history, verify that you are correctly prepped for the exam, and explain the procedure.  Afterwards,  an IV will be started in your arm and electrodes will be placed on your skin for EKG monitoring during the stress portion of the exam. Then you  will be escorted to the PET/CT scanner.  There, staff will get you positioned on the scanner and obtain a blood pressure and EKG.  During the exam, you will continue to be connected to the EKG and blood pressure machines.  A small, safe amount of a radioactive tracer will be injected in your IV to obtain a series of pictures of your heart along with an injection of a stress agent.    After your Exam:  It is recommended that you eat a meal and drink a caffeinated beverage to counter act any effects of the stress agent.  Drink plenty of fluids for the remainder of the day and urinate frequently for the first couple of hours after the exam.  Your doctor will inform you of your test results within 7-10 business days.  For more information and frequently asked questions, please visit our website : [email or web address]  For questions about your test or how to prepare for your test, please call: Cardiac Imaging Nurse Navigators Office: [phone]

## 2022-12-17 LAB — CBC
Hematocrit: 32.6 % — ABNORMAL LOW (ref 34.0–46.6)
Hemoglobin: 10.2 g/dL — ABNORMAL LOW (ref 11.1–15.9)
MCH: 29.8 pg (ref 26.6–33.0)
MCHC: 31.3 g/dL — ABNORMAL LOW (ref 31.5–35.7)
MCV: 95 fL (ref 79–97)
Platelets: 254 10*3/uL (ref 150–450)
RBC: 3.42 x10E6/uL — ABNORMAL LOW (ref 3.77–5.28)
RDW: 12.7 % (ref 11.7–15.4)
WBC: 4.8 10*3/uL (ref 3.4–10.8)

## 2022-12-29 ENCOUNTER — Ambulatory Visit: Payer: Medicare HMO | Admitting: Cardiology

## 2022-12-30 ENCOUNTER — Other Ambulatory Visit: Payer: Medicare HMO | Admitting: Pharmacist

## 2022-12-30 DIAGNOSIS — I152 Hypertension secondary to endocrine disorders: Secondary | ICD-10-CM

## 2022-12-30 NOTE — Patient Instructions (Signed)
It was a pleasure speaking with you today!  Continue taking spironolactone 50 mg daily and hydralazine 25 mg three times daily.  Come 11/8 for blood work.  Contact me if you are having symptoms of low blood pressure such as dizziness or weakness.  Feel free to call with any questions or concerns!  Arbutus Leas, PharmD, BCPS [facility] Clinical Pharmacist [facility] [phone]

## 2022-12-30 NOTE — Progress Notes (Signed)
   12/30/2022 Name: Cheryl [name] MRN: [id number] DOB: [birth year]  Chief Complaint  Patient presents with   Hypertension   Medication Management    Cheryl [name] is a 72 y.o. year old female who presented for a telephone visit.   They were referred to the pharmacist by their PCP for assistance in managing  HTN and hypokalemia .   Subjective:  Care Team: Primary Care Provider: [name] Grandchild, MD ; Next Scheduled Visit: 03/02/2023 Endocrinologist Dr. Leslie Dales; Next Scheduled Visit: 07/24/2023 Nephrologist: Dr. Ronalee Belts, last visit: 09/17/2022 Cardiologist: Dr. Anne Fu, Next visit 12/29/2022  Medication Access/Adherence  Current Pharmacy:  [id number] - [place], [place] - [address] AT [facility] [address] [place] [address] Phone: [phone] Fax: [phone]   Patient reports affordability concerns with their medications: No  Patient reports access/transportation concerns to their pharmacy: No  Patient reports adherence concerns with their medications:  No     Hypertension: Pt complains of fatigue since med changes in August  - she notes she seems to feel somewhat better lately Current medications: hydralazine 25 mg TID, spironolactone 25 mg daily, potassium chloride 20 mEq daily *She was previously on spironolactone 100 mg daily originally prescribed by endocrinologist for hypoaldosteronism. This was stopped 10/03/2022. ED provider prescribed 3 days of Lasix on 10/26/2022 for lower extremity edema, then patient had hypokalemia on lab work on 10/31/2022. She took 5 days of potassium chloride as prescribed.   Home BP readings over the last 2 weeks: 127/67 126/71 117/69 134/77 120/65 141/69 117/68 101/54 today  About a week ago [facility] Had to sit down for about 5 minutes and then was fine. Notes she has lost weight and her legs are smaller. Notes appetite has decreased.    Objective: BP Readings from Last 3 Encounters:   12/16/22 (!) 132/56  11/24/22 130/72  10/30/22 128/70        Latest Ref Rng & Units 12/04/2022   11:56 AM 11/24/2022    2:21 PM 10/31/2022    3:40 PM  BMP  Glucose 70 - 99 mg/dL 89  86  132   BUN 6 - 23 mg/dL 15  19  19    Creatinine 0.40 - 1.20 mg/dL 4.40  1.02  7.25   Sodium 135 - 145 mEq/L 144  144  144   Potassium 3.5 - 5.1 mEq/L 3.1  2.6  2.9   Chloride 96 - 112 mEq/L 105  101  104   CO2 19 - 32 mEq/L 29  30  30    Calcium 8.4 - 10.5 mg/dL 9.6  9.4  9.9      Medications Reviewed Today   Medications were not reviewed in this encounter       Assessment/Plan:  - BP is controlled, BP goal <130/80 - Continue spironolactone 50 mg daily - Recommended to monitor BP at home  - 11/8 BMP to check K and GFR since increased spironolactone - Consider d/c hydralazine if hypotension occurring   Follow Up Plan: telephone f/u 11/14  Arbutus Leas, PharmD, BCPS [facility] [phone]

## 2023-01-02 ENCOUNTER — Other Ambulatory Visit (INDEPENDENT_AMBULATORY_CARE_PROVIDER_SITE_OTHER): Payer: Medicare HMO

## 2023-01-02 DIAGNOSIS — I152 Hypertension secondary to endocrine disorders: Secondary | ICD-10-CM

## 2023-01-02 LAB — BASIC METABOLIC PANEL
BUN: 28 mg/dL — ABNORMAL HIGH (ref 6–23)
CO2: 26 meq/L (ref 19–32)
Calcium: 10.9 mg/dL — ABNORMAL HIGH (ref 8.4–10.5)
Chloride: 107 meq/L (ref 96–112)
Creatinine, Ser: 1.78 mg/dL — ABNORMAL HIGH (ref 0.40–1.20)
GFR: 27.76 mL/min — ABNORMAL LOW (ref >60.00)
Glucose, Bld: 96 mg/dL (ref 70–99)
Potassium: 4.7 meq/L (ref 3.5–5.1)
Sodium: 140 meq/L (ref 135–145)

## 2023-01-05 ENCOUNTER — Other Ambulatory Visit: Payer: Medicare HMO | Admitting: Pharmacist

## 2023-01-05 NOTE — Progress Notes (Signed)
   01/05/2023 Name: Cheryl [name] MRN: [id number] DOB: 1950-06-01  Chief Complaint  Patient presents with   Hypertension   Medication Management    Cheryl [name] is a 72 y.o. year old female who presented for a telephone visit.   They were referred to the pharmacist by their PCP for assistance in managing  HTN and hypokalemia .    Subjective:  Care Team: Primary Care Provider: [name] Grandchild, MD ; Next Scheduled Visit: 03/02/2023 Endocrinologist Dr. Leslie Dales; Next Scheduled Visit: 07/24/2023 Nephrologist: Dr. Ronalee Belts, last visit: 09/17/2022 Cardiologist: Dr. Anne Fu, Next visit 12/29/2022  Medication Access/Adherence  Current Pharmacy:  [id number] [place], [place] - [address] AT [facility] [address] [place] [address] Phone: [phone] Fax: [phone]   Patient reports affordability concerns with their medications: No  Patient reports access/transportation concerns to their pharmacy: No  Patient reports adherence concerns with their medications:  No     Hypertension: Pt complains of fatigue since med changes in August  - she notes she seems to feel somewhat better lately Current medications: hydralazine 25 mg TID, spironolactone 50 mg daily, potassium chloride 20 mEq daily *She was previously on spironolactone 100 mg daily originally prescribed by endocrinologist for hypoaldosteronism. This was stopped 10/03/2022. ED provider prescribed 3 days of Lasix on 10/26/2022 for lower extremity edema, then patient had hypokalemia on lab work on 10/31/2022. She took 5 days of potassium chloride as prescribed.   Home BP readings over the last 2 weeks: 127/67 126/71 117/69 134/77 120/65 141/69 117/68 101/54   About a week [facility] Had to sit down for about 5 minutes and then was fine. Notes she has lost weight and her legs are smaller. Notes appetite has decreased.    Objective: BP Readings from Last 3 Encounters:   12/16/22 (!) 132/56  11/24/22 130/72  10/30/22 128/70        Latest Ref Rng & Units 01/02/2023    1:55 PM 12/04/2022   11:56 AM 11/24/2022    2:21 PM  BMP  Glucose 70 - 99 mg/dL 96  89  86   BUN 6 - 23 mg/dL 28  15  19    Creatinine 0.40 - 1.20 mg/dL 5.28  4.13  2.44   Sodium 135 - 145 mEq/L 140  144  144   Potassium 3.5 - 5.1 mEq/L 4.7  3.1  2.6   Chloride 96 - 112 mEq/L 107  105  101   CO2 19 - 32 mEq/L 26  29  30    Calcium 8.4 - 10.5 mg/dL 01.0  9.6  9.4      Medications Reviewed Today   Medications were not reviewed in this encounter       Assessment/Plan:  - BP is controlled, BP goal <130/80. Concern for possible hypotension. - BMP showed increased in Scr and declined GFR likely secondary to spironolactone increase - Recommended to monitor BP at home  - Recommended pt to contact nephrologist to follow up sooner given change in renal function - Consider d/c hydralazine if hypotension occurring   Follow Up Plan: 1 month telephone f/u 12/9  Arbutus Leas, PharmD, BCPS [facility] [phone]

## 2023-01-21 ENCOUNTER — Other Ambulatory Visit: Payer: Self-pay | Admitting: Internal Medicine

## 2023-01-21 DIAGNOSIS — E2740 Unspecified adrenocortical insufficiency: Secondary | ICD-10-CM

## 2023-01-21 DIAGNOSIS — E876 Hypokalemia: Secondary | ICD-10-CM

## 2023-01-28 ENCOUNTER — Other Ambulatory Visit: Payer: Self-pay | Admitting: Internal Medicine

## 2023-01-28 DIAGNOSIS — I10 Essential (primary) hypertension: Secondary | ICD-10-CM

## 2023-02-02 ENCOUNTER — Other Ambulatory Visit: Payer: Medicare HMO | Admitting: Pharmacist

## 2023-02-02 NOTE — Patient Instructions (Signed)
Arbutus Leas, PharmD, BCPS, CPP Clinical Pharmacist Practitioner [facility] [phone]

## 2023-02-02 NOTE — Progress Notes (Signed)
   02/02/2023 Name: Cheryl [name] MRN: [id number] DOB: 1951-01-29  Chief Complaint  Patient presents with   Hypertension   Medication Management    Cheryl [name] is a 72 y.o. year old female who presented for a telephone visit.   They were referred to the pharmacist by their PCP for assistance in managing  HTN and hypokalemia .    Subjective:  Care Team: Primary Care Provider: [name] Grandchild, MD ; Next Scheduled Visit: 03/02/2023 Endocrinologist Dr. Leslie Dales; Next Scheduled Visit: 07/24/2023 Nephrologist: Dr. Ronalee Belts, last visit: 09/17/2022 Cardiologist: Dr. Anne Fu, Next visit 12/29/2022  Medication Access/Adherence  Current Pharmacy:  [id number] [place], [place] - [address] AT [facility] [address] [place]  [address] Phone: [phone] Fax: [phone]   Patient reports affordability concerns with their medications: No  Patient reports access/transportation concerns to their pharmacy: No  Patient reports adherence concerns with their medications:  No     Hypertension: Current medications: hydralazine 25 mg TID, spironolactone 50 mg daily, potassium chloride 20 mEq daily *She was previously on spironolactone 100 mg daily originally prescribed by endocrinologist for hypoaldosteronism. This was stopped 10/03/2022. ED provider prescribed 3 days of Lasix on 10/26/2022 for lower extremity edema, then patient had hypokalemia on lab work on 10/31/2022. She took 5 days of potassium chloride as prescribed.   Today 126/66 12/8 100/54  Denies s/sx of hypotension such as dizziness or weakness.   Objective: BP Readings from Last 3 Encounters:  12/16/22 (!) 132/56  11/24/22 130/72  10/30/22 128/70        Latest Ref Rng & Units 01/02/2023    1:55 PM 12/04/2022   11:56 AM 11/24/2022    2:21 PM  BMP  Glucose 70 - 99 mg/dL 96  89  86   BUN 6 - 23 mg/dL 28  15  19    Creatinine 0.40 - 1.20 mg/dL 6.96  2.95  2.84   Sodium 135 - 145 mEq/L 140  144  144    Potassium 3.5 - 5.1 mEq/L 4.7  3.1  2.6   Chloride 96 - 112 mEq/L 107  105  101   CO2 19 - 32 mEq/L 26  29  30    Calcium 8.4 - 10.5 mg/dL 13.2  9.6  9.4      Medications Reviewed Today   Medications were not reviewed in this encounter       Assessment/Plan:  - BP is controlled, BP goal <130/80. Concern for possible hypotension. - BMP showed increased in Scr and declined GFR likely secondary to spironolactone increase - Recommended to monitor BP at home  - Recommended pt to contact nephrologist to follow up sooner given change in renal function - she states she has an appt in February - Recommend decreasing hydralazine to BID, will follow up in 1 week to review BP readings.    Follow Up Plan: 12/17  Arbutus Leas, PharmD, BCPS [facility] [phone]

## 2023-02-10 ENCOUNTER — Other Ambulatory Visit: Payer: Medicare HMO | Admitting: Pharmacist

## 2023-02-10 DIAGNOSIS — E876 Hypokalemia: Secondary | ICD-10-CM

## 2023-02-10 DIAGNOSIS — E2740 Unspecified adrenocortical insufficiency: Secondary | ICD-10-CM

## 2023-02-10 MED ORDER — SPIRONOLACTONE 100 MG PO TABS: 50.0000 mg | ORAL_TABLET | Freq: Every day | ORAL | Status: DC

## 2023-02-10 NOTE — Progress Notes (Signed)
   02/10/2023 Name: Cheryl [name] MRN: [id number] DOB: 30-Oct-1950  Chief Complaint  Patient presents with   Hypertension   Medication Management    Cheryl [name] is a 72 y.o. year old female who presented for a telephone visit.   They were referred to the pharmacist by their PCP for assistance in managing  HTN and hypokalemia .    Subjective:  Care Team: Primary Care Provider: [name] Grandchild, MD ; Next Scheduled Visit: 03/02/2023 Endocrinologist Dr. Leslie Dales; Next Scheduled Visit: 07/24/2023 Nephrologist: Dr. Ronalee Belts, last visit: 09/17/2022 Cardiologist: Dr. Anne Fu, Next visit 12/29/2022  Medication Access/Adherence  Current Pharmacy:  [id number] [place], [place] - [address] AT [facility] [address] [place]  [address] Phone: [phone] Fax: [phone]   Patient reports affordability concerns with their medications: No  Patient reports access/transportation concerns to their pharmacy: No  Patient reports adherence concerns with their medications:  No     Hypertension: Current medications: hydralazine 25 mg TID, spironolactone 50 mg daily, potassium chloride 20 mEq daily *She was previously on spironolactone 100 mg daily originally prescribed by endocrinologist for hypoaldosteronism. This was stopped 10/03/2022. ED provider prescribed 3 days of Lasix on 10/26/2022 for lower extremity edema, then patient had hypokalemia on lab work on 10/31/2022. She took 5 days of potassium chloride as prescribed.   Pt reports one reading in the 140s systolic, otherwise have been in the low 100s/60s 145/69 on 12/14 105/63 yesterday  Denies s/sx of hypotension such as dizziness or weakness. Reports fatigue.   Objective: BP Readings from Last 3 Encounters:  12/16/22 (!) 132/56  11/24/22 130/72  10/30/22 128/70        Latest Ref Rng & Units 01/02/2023    1:55 PM 12/04/2022   11:56 AM 11/24/2022    2:21 PM  BMP  Glucose 70 - 99 mg/dL 96  89  86   BUN 6 -  23 mg/dL 28  15  19    Creatinine 0.40 - 1.20 mg/dL 6.96  2.95  2.84   Sodium 135 - 145 mEq/L 140  144  144   Potassium 3.5 - 5.1 mEq/L 4.7  3.1  2.6   Chloride 96 - 112 mEq/L 107  105  101   CO2 19 - 32 mEq/L 26  29  30    Calcium 8.4 - 10.5 mg/dL 13.2  9.6  9.4      Medications Reviewed Today   Medications were not reviewed in this encounter       Assessment/Plan:  - BP is controlled, BP goal <130/80. Concern for possible hypotension. - BMP showed increased in Scr and declined GFR likely secondary to spironolactone increase - Recommended to monitor BP at home  - Recommended pt to contact nephrologist to follow up sooner given change in renal function - she states she has an appt in February - Recommend stopping hydralazine and monitor BP. Will f/u in 1 week   Follow Up Plan: 12/23  Arbutus Leas, PharmD, BCPS [facility] [phone]

## 2023-02-16 ENCOUNTER — Other Ambulatory Visit (INDEPENDENT_AMBULATORY_CARE_PROVIDER_SITE_OTHER): Payer: Medicare HMO | Admitting: Pharmacist

## 2023-02-16 ENCOUNTER — Other Ambulatory Visit: Payer: Medicare HMO

## 2023-02-16 VITALS — BP 102/58

## 2023-02-16 DIAGNOSIS — I10 Essential (primary) hypertension: Secondary | ICD-10-CM

## 2023-02-16 NOTE — Progress Notes (Signed)
   02/16/2023 Name: Jonnette Stockett MRN: [id number] DOB: 09-08-1950  No chief complaint on file.   Lizzet Verstraete is a 72 y.o. year old female who presented for a telephone visit.   They were referred to the pharmacist by their PCP for assistance in managing  HTN and hypokalemia .     Subjective:  Care Team: Primary Care Provider: [name] Grandchild, MD ; Next Scheduled Visit: 03/02/2023 Endocrinologist Dr. Leslie Dales; Next Scheduled Visit: 07/24/2023 Nephrologist: Dr. Ronalee Belts, last visit: 09/17/2022 Cardiologist: Dr. Anne Fu, Next visit 12/29/2022  Medication Access/Adherence  Current Pharmacy:  [id number] [place], [place] - [address] AT [facility] [address] [place] [address] Phone: [phone] Fax: [phone]   Patient reports affordability concerns with their medications: No  Patient reports access/transportation concerns to their pharmacy: No  Patient reports adherence concerns with their medications:  No     Hypertension: Current medications: hydralazine 25 mg TID, spironolactone 50 mg daily, potassium chloride 20 mEq daily *She was previously on spironolactone 100 mg daily originally prescribed by endocrinologist for hypoaldosteronism. This was stopped 10/03/2022. ED provider prescribed 3 days of Lasix on 10/26/2022 for lower extremity edema, then patient had hypokalemia on lab work on 10/31/2022. She took 5 days of potassium chloride as prescribed.   Home BP readings: Stopped hydralzine 12/18 12/19 138/62 12/20 144/64 12/21 119/58 12/22 109/48, 113/53 12/23 102/58  Denies s/sx of hypotension such as dizziness or weakness.    Objective: BP Readings from Last 3 Encounters:  12/16/22 (!) 132/56  11/24/22 130/72  10/30/22 128/70        Latest Ref Rng & Units 01/02/2023    1:55 PM 12/04/2022   11:56 AM 11/24/2022    2:21 PM  BMP  Glucose 70 - 99 mg/dL 96  89  86   BUN 6 - 23 mg/dL 28  15  19    Creatinine 0.40 - 1.20 mg/dL 4.25  9.56   3.87   Sodium 135 - 145 mEq/L 140  144  144   Potassium 3.5 - 5.1 mEq/L 4.7  3.1  2.6   Chloride 96 - 112 mEq/L 107  105  101   CO2 19 - 32 mEq/L 26  29  30    Calcium 8.4 - 10.5 mg/dL 56.4  9.6  9.4      Medications Reviewed Today   Medications were not reviewed in this encounter       Assessment/Plan:  - BP is controlled, BP goal <130/80.  - BMP showed increased in Scr and declined GFR likely secondary to spironolactone increase - Recommended to monitor BP at home  - Recommended pt to contact nephrologist to follow up sooner given change in renal function - she states she has an appt in February - Continue spironolactone 50 mg daily. Advised to contact me if BP >130/80 frequently, otherwise she will see PCP in 2 weeks for follow up. Recommend updated BMP   Follow Up Plan: f/u after 1/6 PCP appt  Arbutus Leas, PharmD, BCPS [facility] [phone]

## 2023-02-19 ENCOUNTER — Encounter (HOSPITAL_COMMUNITY): Payer: Self-pay

## 2023-02-24 ENCOUNTER — Other Ambulatory Visit: Payer: Self-pay | Admitting: Cardiovascular Disease

## 2023-02-24 ENCOUNTER — Ambulatory Visit (HOSPITAL_COMMUNITY)
Admission: RE | Admit: 2023-02-24 | Discharge: 2023-02-24 | Disposition: A | Discharge status: Home / Self Care | Payer: Medicare HMO | Source: Ambulatory Visit | Attending: Cardiovascular Disease | Admitting: Cardiovascular Disease

## 2023-02-24 DIAGNOSIS — R072 Precordial pain: Secondary | ICD-10-CM

## 2023-02-24 MED ORDER — GADOBUTROL 1 MMOL/ML IV SOLN
9.0000 mL | Freq: Once | INTRAVENOUS | Status: AC | PRN
  Administered 2023-02-24: 9 mL via INTRAVENOUS

## 2023-02-27 ENCOUNTER — Encounter (HOSPITAL_COMMUNITY): Payer: Self-pay

## 2023-02-27 ENCOUNTER — Other Ambulatory Visit (HOSPITAL_COMMUNITY): Payer: Self-pay | Admitting: *Deleted

## 2023-02-27 DIAGNOSIS — R079 Chest pain, unspecified: Secondary | ICD-10-CM

## 2023-03-02 ENCOUNTER — Encounter: Payer: Self-pay | Admitting: Internal Medicine

## 2023-03-02 ENCOUNTER — Ambulatory Visit (INDEPENDENT_AMBULATORY_CARE_PROVIDER_SITE_OTHER): Payer: Medicare HMO | Admitting: Internal Medicine

## 2023-03-02 VITALS — BP 136/74 | HR 72 | Temp 97.7°F | Resp 16 | Ht 64.0 in | Wt 127.4 lb

## 2023-03-02 DIAGNOSIS — D538 Other specified nutritional anemias: Secondary | ICD-10-CM | POA: Diagnosis not present

## 2023-03-02 DIAGNOSIS — E519 Thiamine deficiency, unspecified: Secondary | ICD-10-CM | POA: Diagnosis not present

## 2023-03-02 DIAGNOSIS — I10 Essential (primary) hypertension: Secondary | ICD-10-CM | POA: Diagnosis not present

## 2023-03-02 DIAGNOSIS — N1832 Chronic kidney disease, stage 3b: Secondary | ICD-10-CM

## 2023-03-02 NOTE — Patient Instructions (Signed)
 Hypertension, Adult High blood pressure (hypertension) is when the force of blood pumping through the arteries is too strong. The arteries are the blood vessels that carry blood from the heart throughout the body. Hypertension forces the heart to work harder to pump blood and may cause arteries to become narrow or stiff. Untreated or uncontrolled hypertension can lead to a heart attack, heart failure, a stroke, kidney disease, and other problems. A blood pressure reading consists of a higher number over a lower number. Ideally, your blood pressure should be below 120/80. The first ("top") number is called the systolic pressure. It is a measure of the pressure in your arteries as your heart beats. The second ("bottom") number is called the diastolic pressure. It is a measure of the pressure in your arteries as the heart relaxes. What are the causes? The exact cause of this condition is not known. There are some conditions that result in high blood pressure. What increases the risk? Certain factors may make you more likely to develop high blood pressure. Some of these risk factors are under your control, including: Smoking. Not getting enough exercise or physical activity. Being overweight. Having too much fat, sugar, calories, or salt (sodium) in your diet. Drinking too much alcohol. Other risk factors include: Having a personal history of heart disease, diabetes, high cholesterol, or kidney disease. Stress. Having a family history of high blood pressure and high cholesterol. Having obstructive sleep apnea. Age. The risk increases with age. What are the signs or symptoms? High blood pressure may not cause symptoms. Very high blood pressure (hypertensive crisis) may cause: Headache. Fast or irregular heartbeats (palpitations). Shortness of breath. Nosebleed. Nausea and vomiting. Vision changes. Severe chest pain, dizziness, and seizures. How is this diagnosed? This condition is diagnosed by  measuring your blood pressure while you are seated, with your arm resting on a flat surface, your legs uncrossed, and your feet flat on the floor. The cuff of the blood pressure monitor will be placed directly against the skin of your upper arm at the level of your heart. Blood pressure should be measured at least twice using the same arm. Certain conditions can cause a difference in blood pressure between your right and left arms. If you have a high blood pressure reading during one visit or you have normal blood pressure with other risk factors, you may be asked to: Return on a different day to have your blood pressure checked again. Monitor your blood pressure at home for 1 week or longer. If you are diagnosed with hypertension, you may have other blood or imaging tests to help your health care provider understand your overall risk for other conditions. How is this treated? This condition is treated by making healthy lifestyle changes, such as eating healthy foods, exercising more, and reducing your alcohol intake. You may be referred for counseling on a healthy diet and physical activity. Your health care provider may prescribe medicine if lifestyle changes are not enough to get your blood pressure under control and if: Your systolic blood pressure is above 130. Your diastolic blood pressure is above 80. Your personal target blood pressure may vary depending on your medical conditions, your age, and other factors. Follow these instructions at home: Eating and drinking  Eat a diet that is high in fiber and potassium, and low in sodium, added sugar, and fat. An example of this eating plan is called the DASH diet. DASH stands for Dietary Approaches to Stop Hypertension. To eat this way: Eat  plenty of fresh fruits and vegetables. Try to fill one half of your plate at each meal with fruits and vegetables. Eat whole grains, such as whole-wheat pasta, brown rice, or whole-grain bread. Fill about one  fourth of your plate with whole grains. Eat or drink low-fat dairy products, such as skim milk or low-fat yogurt. Avoid fatty cuts of meat, processed or cured meats, and poultry with skin. Fill about one fourth of your plate with lean proteins, such as fish, chicken without skin, beans, eggs, or tofu. Avoid pre-made and processed foods. These tend to be higher in sodium, added sugar, and fat. Reduce your daily sodium intake. Many people with hypertension should eat less than 1,500 mg of sodium a day. Do not drink alcohol if: Your health care provider tells you not to drink. You are pregnant, may be pregnant, or are planning to become pregnant. If you drink alcohol: Limit how much you have to: 0-1 drink a day for women. 0-2 drinks a day for men. Know how much alcohol is in your drink. In the [place] one drink equals one 12 oz bottle of beer (355 mL), one 5 oz glass of wine (148 mL), or one 1 oz glass of hard liquor (44 mL). Lifestyle  Work with your health care provider to maintain a healthy body weight or to lose weight. Ask what an ideal weight is for you. Get at least 30 minutes of exercise that causes your heart to beat faster (aerobic exercise) most days of the week. Activities may include walking, swimming, or biking. Include exercise to strengthen your muscles (resistance exercise), such as Pilates or lifting weights, as part of your weekly exercise routine. Try to do these types of exercises for 30 minutes at least 3 days a week. Do not use any products that contain nicotine or tobacco. These products include cigarettes, chewing tobacco, and vaping devices, such as e-cigarettes. If you need help quitting, ask your health care provider. Monitor your blood pressure at home as told by your health care provider. Keep all follow-up visits. This is important. Medicines Take over-the-counter and prescription medicines only as told by your health care provider. Follow directions carefully. Blood  pressure medicines must be taken as prescribed. Do not skip doses of blood pressure medicine. Doing this puts you at risk for problems and can make the medicine less effective. Ask your health care provider about side effects or reactions to medicines that you should watch for. Contact a health care provider if you: Think you are having a reaction to a medicine you are taking. Have headaches that keep coming back (recurring). Feel dizzy. Have swelling in your ankles. Have trouble with your vision. Get help right away if you: Develop a severe headache or confusion. Have unusual weakness or numbness. Feel faint. Have severe pain in your chest or abdomen. Vomit repeatedly. Have trouble breathing. These symptoms may be an emergency. Get help right away. Call 911. Do not wait to see if the symptoms will go away. Do not drive yourself to the hospital. Summary Hypertension is when the force of blood pumping through your arteries is too strong. If this condition is not controlled, it may put you at risk for serious complications. Your personal target blood pressure may vary depending on your medical conditions, your age, and other factors. For most people, a normal blood pressure is less than 120/80. Hypertension is treated with lifestyle changes, medicines, or a combination of both. Lifestyle changes include losing weight, eating a healthy,  low-sodium diet, exercising more, and limiting alcohol. This information is not intended to replace advice given to you by your health care provider. Make sure you discuss any questions you have with your health care provider. Document Revised: 12/18/2020 Document Reviewed: 12/18/2020 Elsevier Patient Education  2024 [employer].

## 2023-03-02 NOTE — Progress Notes (Signed)
 Subjective:  Patient ID: Cheryl [name], female    DOB: Mar 06, 1950  Age: 73 y.o. MRN: [id number]  CC: Hypertension   HPI Cheryl [name] presents for f/up -----  Discussed the use of AI scribe software for clinical note transcription with the patient, who gave verbal consent to proceed.  History of Present Illness   The patient, with a known history of sarcoidosis, recently underwent an MRI of the heart and is awaiting results. She reports a significant lack of energy, finding it difficult to perform even basic tasks such as climbing stairs, indicative of fatigue. However, she denies experiencing any chest pain, shortness of breath, dizziness, lightheadedness, palpitations, or leg swelling. There is no reported change in weight or appetite.  The patient also mentions a previous echocardiogram that revealed a small leakage, but she is not currently experiencing any related symptoms. She denies any cardiac symptoms and reports no new medications.       Outpatient Medications Prior to Visit  Medication Sig Dispense Refill   alendronate (FOSAMAX) 10 MG tablet Take 70 mg by mouth once a week. Take with a full glass of water on an empty stomach.     Apoaequorin (PREVAGEN PO) Take 1 tablet by mouth daily.     bimatoprost (LUMIGAN) 0.01 % SOLN at bedtime.     BIOTIN PO Take by mouth.     budesonide-formoterol (SYMBICORT) 80-4.5 MCG/ACT inhaler Inhale 2 puffs into the lungs daily as needed (for shortness of breath).     KLOR-CON  M20 20 MEQ tablet TAKE 1 TABLET BY MOUTH EVERY DAY 90 tablet 0   Levothyroxine  Sodium (SYNTHROID  PO) Take by mouth.     Omega-3 Fatty Acids (FISH OIL PO) Take by mouth.     rosuvastatin  (CRESTOR ) 10 MG tablet Take 1 tablet (10 mg total) by mouth daily. 90 tablet 1   spironolactone  (ALDACTONE ) 100 MG tablet Take 0.5 tablets (50 mg total) by mouth daily.     thiamine  (VITAMIN B1) 100 MG tablet Take 1 tablet (100 mg total) by mouth daily. 90 tablet 1   No  facility-administered medications prior to visit.    ROS Review of Systems  Constitutional: Negative.  Negative for appetite change, diaphoresis, fatigue and unexpected weight change.  HENT: Negative.    Eyes: Negative.   Respiratory:  Negative for cough, chest tightness, shortness of breath and wheezing.   Cardiovascular:  Negative for chest pain, palpitations and leg swelling.  Gastrointestinal:  Negative for abdominal pain, constipation, diarrhea, nausea and vomiting.  Endocrine: Negative.   Genitourinary: Negative.  Negative for difficulty urinating.  Musculoskeletal: Negative.   Skin: Negative.   Neurological: Negative.  Negative for dizziness and weakness.  Hematological:  Negative for adenopathy. Does not bruise/bleed easily.  Psychiatric/Behavioral:  Positive for confusion and decreased concentration. Negative for dysphoric mood. The patient is not nervous/anxious.     Objective:  BP 136/74 (BP Location: Left Arm, Patient Position: Sitting, Cuff Size: Normal)   Pulse 72   Temp 97.7 F (36.5 C) (Oral)   Resp 16   Ht 5' 4 (1.626 m)   Wt 127 lb 6.4 oz (57.8 kg)   SpO2 99%   BMI 21.87 kg/m   BP Readings from Last 3 Encounters:  03/03/23 137/65  03/02/23 136/74  02/16/23 (!) 102/58    Wt Readings from Last 3 Encounters:  03/02/23 127 lb 6.4 oz (57.8 kg)  12/16/22 127 lb 9.6 oz (57.9 kg)  11/24/22 137 lb (62.1 kg)    Physical Exam  Vitals reviewed.  Constitutional:      Appearance: Normal appearance.  HENT:     Mouth/Throat:     Mouth: Mucous membranes are moist.  Eyes:     General: No scleral icterus.    Conjunctiva/sclera: Conjunctivae normal.  Cardiovascular:     Rate and Rhythm: Normal rate and regular rhythm.     Heart sounds: No murmur heard.    Comments: EKG-- NSR, 65 bpm TWI in inferior/anterior/lateral leads is unchanged No LVH or Q waves Unchanged Pulmonary:     Effort: Pulmonary effort is normal.     Breath sounds: No stridor. No wheezing,  rhonchi or rales.  Abdominal:     General: Abdomen is flat.     Palpations: There is no mass.     Tenderness: There is no abdominal tenderness. There is no guarding.     Hernia: No hernia is present.  Musculoskeletal:        General: Normal range of motion.     Cervical back: Neck supple.     Right lower leg: No edema.     Left lower leg: No edema.  Lymphadenopathy:     Cervical: No cervical adenopathy.  Skin:    General: Skin is warm and dry.  Neurological:     General: No focal deficit present.     Mental Status: She is alert. Mental status is at baseline.  Psychiatric:        Mood and Affect: Mood normal.        Behavior: Behavior normal.     Lab Results  Component Value Date   WBC 4.8 12/16/2022   HGB 10.2 (L) 12/16/2022   HCT 32.6 (L) 12/16/2022   PLT 254 12/16/2022   GLUCOSE 96 01/02/2023   CHOL 160 10/03/2022   TRIG 95.0 10/03/2022   HDL 56.70 10/03/2022   LDLCALC 84 10/03/2022   ALT 10 10/03/2022   AST 22 10/03/2022   NA 140 01/02/2023   K 4.7 01/02/2023   CL 107 01/02/2023   CREATININE 1.78 (H) 01/02/2023   BUN 28 (H) 01/02/2023   CO2 26 01/02/2023   TSH 1.34 10/03/2022    No results found.   Assessment & Plan:   Primary hypertension- Her BP is well controlled. EKG is negative for LVH. -     EKG 12-Lead  Anemia due to acquired thiamine  deficiency -     Thiamine  HCl; Take 1 tablet (100 mg total) by mouth daily.  Dispense: 90 tablet; Refill: 1  Stage 3b chronic kidney disease (HCC)- Will avoid nephrotoxic agents      Follow-up: Return in about 6 months (around 08/30/2023).  Debby Molt, MD

## 2023-03-03 ENCOUNTER — Encounter (HOSPITAL_COMMUNITY)
Admission: RE | Admit: 2023-03-03 | Discharge: 2023-03-03 | Disposition: A | Discharge status: Home / Self Care | Payer: Medicare HMO | Source: Ambulatory Visit | Attending: Cardiovascular Disease | Admitting: Cardiovascular Disease

## 2023-03-03 ENCOUNTER — Other Ambulatory Visit: Payer: Self-pay | Admitting: Internal Medicine

## 2023-03-03 DIAGNOSIS — R072 Precordial pain: Secondary | ICD-10-CM | POA: Insufficient documentation

## 2023-03-03 DIAGNOSIS — N1832 Chronic kidney disease, stage 3b: Secondary | ICD-10-CM | POA: Insufficient documentation

## 2023-03-03 DIAGNOSIS — E876 Hypokalemia: Secondary | ICD-10-CM

## 2023-03-03 LAB — NM PET CT CARDIAC PERFUSION MULTI W/ABSOLUTE BLOODFLOW
LV dias vol: 86 mL (ref 46–106)
MBFR: 2.19
Nuc Rest EF: 43 %
Nuc Stress EF: 49 %
Peak HR: 92 {beats}/min
Rest HR: 63 {beats}/min
Rest MBF: 0.78 ml/g/min
Rest Nuclear Isotope Dose: 14.8 mCi
Rest perfusion cavity size (mL): 86 mL
ST Depression (mm): 0 mm
Stress MBF: 1.71 ml/g/min
Stress Nuclear Isotope Dose: 15.1 mCi
Stress perfusion cavity size (mL): 88 mL

## 2023-03-03 MED ORDER — REGADENOSON 0.4 MG/5ML IV SOLN
INTRAVENOUS | Status: AC
  Filled 2023-03-03: qty 5

## 2023-03-03 MED ORDER — THIAMINE HCL 100 MG PO TABS: 100.0000 mg | ORAL_TABLET | Freq: Every day | ORAL | 1 refills | Status: AC

## 2023-03-03 MED ORDER — RUBIDIUM RB82 GENERATOR (RUBYFILL)
14.7000 | PACK | Freq: Once | INTRAVENOUS | Status: AC
  Administered 2023-03-03: 14.7 via INTRAVENOUS

## 2023-03-03 MED ORDER — REGADENOSON 0.4 MG/5ML IV SOLN
0.4000 mg | Freq: Once | INTRAVENOUS | Status: AC
  Administered 2023-03-03: 0.4 mg via INTRAVENOUS

## 2023-03-03 MED ORDER — RUBIDIUM RB82 GENERATOR (RUBYFILL)
15.1000 | PACK | Freq: Once | INTRAVENOUS | Status: AC
  Administered 2023-03-03: 15.1 via INTRAVENOUS

## 2023-03-04 NOTE — H&P (View-Only) (Signed)
CARDIOLOGY CONSULT NOTE       Patient ID: Cheryl [name] MRN: [id number] DOB/AGE: [birth year] 73 y.o.  Referring Physician: Yetta [name] Primary Physician: Cheryl Grandchild, MD Primary Cardiologist: Eden Emms Reason for Consultation: AV dx   HPI:  73 y.o. referred by Dr Cheryl [name] for aortic valve dx. First seen by me 12/16/22 History of AAA, HLD, HTN Sarcoid and hypothyroidism. She has had chronic LE edema with issues low K She does have some exertional dyspnea No chest pain Systolic BP has been up diastolic not low. Aldactone added back by primary 11/24/22 Renal function is abnormal with CR 1.57 to 1.25 BNP minimally elevated at 272 She has not had any imaging of her LE venous system   Review of her TTE done 09/19/22 showed unusual apical aneurysm as well has moderate AR in setting of tri leaflet AV and ascending aorta of 3.8 cm   She has had a repair of descending thoracic aneurysm by Dr Cheryl [name] in 2012  Last [place] 2017 no residual aneurysm  She has no dyspnea or chest pain. Has not had CAD documented. LE edema mild and improved on aldactone She is married with one older son in [place] with 3 grand kids. 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 Apical wall thickness 10 mm Consistent with diagnosis of more burned out apical hypertrophic DCM>  AR was moderate with RF 19%  PET/CT: EF 49% apical aneurysm ? LAD infarct with peri infarct ischemia severe coronary calcium involving all 3 arteries   Discussed with patient differential includes apical hypertrophic DCM, prior LAD infarct or possible embolic infarct to distal LAD  Favor diagnostic cath with limited dye and pre hydration. Given age and calcium on CT scan cannot r/o silent infarct   ROS All other systems reviewed and negative except as noted above  Past  Medical History:  Diagnosis Date   AAA (abdominal aortic aneurysm) (HCC)    Arthritis    osteoarthritis   Hyperlipidemia    Hypertension    Hypoaldosteronism (HCC)    Renal disorder    renal insufficiency   Sarcoidosis    Thyroid disease    hypothyroidism    No family history on file.  Social History   Socioeconomic History   Marital status: Married    Spouse name: Not on file   Number of children: Not on file   Years of education: Not on file   Highest education level: Master's degree (e.g., MA, MS, MEng, MEd, MSW, MBA)  Occupational History   Not on file  Tobacco Use   Smoking status: Never    Passive exposure: Never   Smokeless tobacco: Not on file  Substance and Sexual Activity   Alcohol use: No   Drug use: No   Sexual activity: Not Currently  Other Topics Concern   Not on file  Social History Narrative   Not on file   Social Drivers of Health   Financial Resource Strain: Low Risk  (02/28/2023)   Overall Financial Resource Strain (CARDIA)    Difficulty of Paying Living Expenses: Not hard at all  Food Insecurity: No Food Insecurity (02/28/2023)   Hunger Vital Sign    Worried About Running Out of Food in the Last Year: Never true    Ran Out of Food in the Last Year: Never true  Transportation Needs: No Transportation  Needs (02/28/2023)   PRAPARE - Administrator, Civil Service (Medical): No    Lack of Transportation (Non-Medical): No  Physical Activity: Insufficiently Active (02/28/2023)   Exercise Vital Sign    Days of Exercise per Week: 2 days    Minutes of Exercise per Session: 30 min  Stress: No Stress Concern Present (02/28/2023)   [employer] of Occupational Health - Occupational Stress Questionnaire    Feeling of Stress : Only a little  Social Connections: Socially Integrated (02/28/2023)   Social Connection and Isolation Panel [NHANES]    Frequency of Communication with Friends and Family: Twice a week    Frequency of Social Gatherings with  Friends and Family: Once a week    Attends Religious Services: More than 4 times per year    Active Member of [employer] or Organizations: Yes    Attends Banker Meetings: 1 to 4 times per year    Marital Status: Married  Catering manager Violence: Not At Risk (10/10/2022)   Humiliation, Afraid, Rape, and Kick questionnaire    Fear of Current or Ex-Partner: No    Emotionally Abused: No    Physically Abused: No    Sexually Abused: No    Past Surgical History:  Procedure Laterality Date   ABDOMINAL AORTIC ANEURYSM REPAIR     BREAST BIOPSY     RENAL BIOPSY     renal vein sampling        Current Outpatient Medications:    Apoaequorin (PREVAGEN PO), Take 1 tablet by mouth daily., Disp: , Rfl:    bimatoprost (LUMIGAN) 0.01 % SOLN, at bedtime., Disp: , Rfl:    BIOTIN PO, Take by mouth., Disp: , Rfl:    budesonide-formoterol (SYMBICORT) 80-4.5 MCG/ACT inhaler, Inhale 2 puffs into the lungs daily as needed (for shortness of breath)., Disp: , Rfl:    hydrALAZINE (APRESOLINE) 25 MG tablet, Take 25 mg by mouth 1 day or 1 dose., Disp: , Rfl:    Levothyroxine Sodium (SYNTHROID PO), Take by mouth., Disp: , Rfl:    Omega-3 Fatty Acids (FISH OIL PO), Take by mouth., Disp: , Rfl:    potassium chloride SA (KLOR-CON M20) 20 MEQ tablet, Take 1 tablet (20 mEq total) by mouth daily., Disp: 90 tablet, Rfl: 1   rosuvastatin (CRESTOR) 10 MG tablet, Take 1 tablet (10 mg total) by mouth daily., Disp: 90 tablet, Rfl: 1   spironolactone (ALDACTONE) 100 MG tablet, Take 0.5 tablets (50 mg total) by mouth daily., Disp: , Rfl:    thiamine (VITAMIN B1) 100 MG tablet, Take 1 tablet (100 mg total) by mouth daily., Disp: 90 tablet, Rfl: 1   alendronate (FOSAMAX) 10 MG tablet, Take 70 mg by mouth once a week. Take with a full glass of water on an empty stomach. (Patient not taking: Reported on 03/11/2023), Disp: , Rfl:     Physical Exam: Blood pressure (!) 142/70, pulse 69, height 5\' 4"  (1.626 m), weight 126 lb  (57.2 kg), SpO2 97%.    Affect appropriate Healthy:  appears stated age HEENT: normal Neck supple with no adenopathy JVP normal no bruits no thyromegaly Lungs clear with no wheezing and good diaphragmatic motion Heart:  S1/S2 SEM and AR  murmur, no rub, gallop or click PMI normal left lateral thoracotomy scar  Abdomen: benighn, BS positve, no tenderness, no AAA no bruit.  No HSM or HJR Distal pulses intact with no bruits No edema Neuro non-focal Skin warm and dry No muscular weakness  Labs:   Lab Results  Component Value Date   WBC 4.8 12/16/2022   HGB 10.2 (L) 12/16/2022   HCT 32.6 (L) 12/16/2022   MCV 95 12/16/2022   PLT 254 12/16/2022   No results for input(s): "NA", "K", "CL", "CO2", "BUN", "CREATININE", "CALCIUM", "PROT", "BILITOT", "ALKPHOS", "ALT", "AST", "GLUCOSE" in the last 168 hours.  Invalid input(s): "LABALBU" Lab Results  Component Value Date   CKTOTAL 169 07/31/2022    Lab Results  Component Value Date   CHOL 160 10/03/2022   CHOL 125 01/02/2022   CHOL 138 11/26/2020   Lab Results  Component Value Date   HDL 56.70 10/03/2022   HDL 57.60 01/02/2022   HDL 54 11/26/2020   Lab Results  Component Value Date   LDLCALC 84 10/03/2022   LDLCALC 54 01/02/2022   LDLCALC 71 11/26/2020   Lab Results  Component Value Date   TRIG 95.0 10/03/2022   TRIG 65.0 01/02/2022   TRIG 74 11/26/2020   Lab Results  Component Value Date   CHOLHDL 3 10/03/2022   CHOLHDL 2 01/02/2022   No results found for: "LDLDIRECT"    Radiology: MR CARDIAC MORPHOLOGY W WO CONTRAST Result Date: 03/04/2023 CLINICAL DATA:  Chest pain/anginal equiv, ECGs and troponins normal Prior descending thoracic aorta repair. COMPARISON: Echo 09/19/22 EXAM: MR CARDIA MORPHOLOGY WITHOUT AND WITH CONTRAST; MR CARDIAC VELOCITY FLOW MAPPING TECHNIQUE: The patient was scanned on a 1.5 Tesla Siemens magnet. A dedicated cardiac coil was used. Functional imaging was done using TrueFisp sequences. 2,3,  and 4 chamber views were done to assess for RWMA's. Modified Simpson's rule using a short axis stack was used to calculate an ejection fraction on a dedicated work Research officer, trade [employer]. The patient received 9mL GADAVIST GADOBUTROL 1 MMOL/ML IV SOLN. After 10 minutes inversion recovery sequences were used to assess for infiltration and scar tissue. Phase contrast velocity encoded images obtained x 2. This examination is tailored for evaluation cardiac anatomy and function and provides very limited assessment of noncardiac structures, which are accordingly not evaluated during interpretation. If there is clinical concern for extracardiac pathology, further evaluation with CT imaging should be considered. FINDINGS: LEFT VENTRICLE: Normal left ventricular chamber size. Mild-moderately increased left ventricular wall thickness. Maximal wall thickness at base is 10 mm, and at apex is approximately 10 mm. When apical wall thickness is indexed to BSA, indexed thickness is 10 mm/1.645 m2 = 6 and may meet criteria for apical HCM. Mildly reduced left ventricular systolic function. LVEF = 48% There is an LV apical aneurysm with akinesis.  No apparent thrombus. Borderline normal values for myocardial edema, T2 = 55 msec, apical T2 values are slightly higher than base and mid. Normal first pass perfusion. There is post contrast delayed myocardial enhancement: LGE in the apical aneurysm, otherwise no LGE. Normal T1 myocardial nulling kinetics suggest against a diagnosis of cardiac amyloidosis. ECV = 26% measured in area of normal myocardium. RIGHT VENTRICLE: Normal right ventricular chamber size. Normal right ventricular wall thickness. Normal right ventricular systolic function. RVEF = 59% There are no regional wall motion abnormalities. No post contrast delayed myocardial enhancement. ATRIA: Mild biatrial dilation. VALVES: Tricuspid aortic valve with central AI. PERICARDIUM: Normal pericardium.  No pericardial effusion.  AORTA: Ascending aorta measures 37 mm. Descending aorta appears grossly normal however angiography not performed. OTHER: No significant extracardiac findings. MEASUREMENTS: Qp/Qs: 1.1 Aortic valve regurgitation: Moderate, regurgitant fraction 19% Pulmonary valve regurgitation: Trivial, regurgitant fraction 2%, visually mild Mitral valve regurgitation: None, regurgitant fraction <  1% Tricuspid valve regurgitation: Trivial, regurgitant fraction 7% Left ventricle: LV female LV EF: 48 % (Normal 52-79%) Absolute volumes: LV EDV: (Normal 78-167 mL) LV ESV: 59mL (Normal 21-64 mL) LV SV: 54mL (Normal 52-114 mL) CO: 3.5L/min (Normal 2.7-6.3 L/min) Indexed volumes: LV EDV: 25mL/sq-m (normal 50-96 mL/sq-m) LV ESV: 74mL/sq-m (normal 10-40 mL/sq-m) LV SV: 24mL/sq-m (Normal 33-64 mL/sq-m) CI: 2.15L/min/sq-m (Normal 1.9-3.9 L/min/sq-m) Right ventricle: RV female RV EF: 59% (normal 52-80%) Absolute volumes: RV EDV: 95mL (normal 79-175 mL) RV ESV: 39mL (Normal 13-75 mL) RV SV: 56mL (Normal 56-110 mL) CO: 3.6L/min (Normal 2.7-6 L/min) Indexed volumes: RV EDV: 9mL/sq-m (Normal 51-97 mL/sq-m) RV ESV: 26mL/sq-m (Normal 9-42 mL/sq-m) RV SV: 57mL/sq-m (Normal 35-61 mL/sq-m) CI: 2.21L/min/sq-m (Normal 1.8-3.8 L/min/sq-m) IMPRESSION: 1. Left ventricular apical pouch approximately 2 cm in diameter with no apparent thrombus. Apical pouch is akinetic with delayed myocardial enhancement suggesting scar. Apical myocardial wall thickness is moderately increased at 10 mm, and when indexed to BSA may meet criteria for apical variant hypertrophic cardiomyopathy. No delayed myocardial enhancement in the myocardium. 2. Normal left ventricular chamber size with mildly reduced LV systolic function, LVEF 48%. 3. Normal right ventricular chamber size and systolic function, RVEF 59%. 4. Moderate aortic valve regurgitation, appears central, tricuspid aortic valve. Regurgitant fraction 19%. With no definite LGE in the apical myocardium, findings of  apical pouch would seem most consistent with apical HCM. Electronically Signed   By: Weston Brass M.D.   On: 03/04/2023 09:35   MR CARDIAC VELOCITY FLOW MAP Result Date: 03/04/2023 CLINICAL DATA:  Chest pain/anginal equiv, ECGs and troponins normal Prior descending thoracic aorta repair. COMPARISON: Echo 09/19/22 EXAM: MR CARDIA MORPHOLOGY WITHOUT AND WITH CONTRAST; MR CARDIAC VELOCITY FLOW MAPPING TECHNIQUE: The patient was scanned on a 1.5 Tesla Siemens magnet. A dedicated cardiac coil was used. Functional imaging was done using TrueFisp sequences. 2,3, and 4 chamber views were done to assess for RWMA's. Modified Simpson's rule using a short axis stack was used to calculate an ejection fraction on a dedicated work Research officer, trade union. The patient received 9mL GADAVIST GADOBUTROL 1 MMOL/ML IV SOLN. After 10 minutes inversion recovery sequences were used to assess for infiltration and scar tissue. Phase contrast velocity encoded images obtained x 2. This examination is tailored for evaluation cardiac anatomy and function and provides very limited assessment of noncardiac structures, which are accordingly not evaluated during interpretation. If there is clinical concern for extracardiac pathology, further evaluation with CT imaging should be considered. FINDINGS: LEFT VENTRICLE: Normal left ventricular chamber size. Mild-moderately increased left ventricular wall thickness. Maximal wall thickness at base is 10 mm, and at apex is approximately 10 mm. When apical wall thickness is indexed to BSA, indexed thickness is 10 mm/1.645 m2 = 6 and may meet criteria for apical HCM. Mildly reduced left ventricular systolic function. LVEF = 48% There is an LV apical aneurysm with akinesis.  No apparent thrombus. Borderline normal values for myocardial edema, T2 = 55 msec, apical T2 values are slightly higher than base and mid. Normal first pass perfusion. There is post contrast delayed myocardial enhancement: LGE  in the apical aneurysm, otherwise no LGE. Normal T1 myocardial nulling kinetics suggest against a diagnosis of cardiac amyloidosis. ECV = 26% measured in area of normal myocardium. RIGHT VENTRICLE: Normal right ventricular chamber size. Normal right ventricular wall thickness. Normal right ventricular systolic function. RVEF = 59% There are no regional wall motion abnormalities. No post contrast delayed myocardial enhancement. ATRIA: Mild biatrial dilation. VALVES: Tricuspid  aortic valve with central AI. PERICARDIUM: Normal pericardium.  No pericardial effusion. AORTA: Ascending aorta measures 37 mm. Descending aorta appears grossly normal however angiography not performed. OTHER: No significant extracardiac findings. MEASUREMENTS: Qp/Qs: 1.1 Aortic valve regurgitation: Moderate, regurgitant fraction 19% Pulmonary valve regurgitation: Trivial, regurgitant fraction 2%, visually mild Mitral valve regurgitation: None, regurgitant fraction <1% Tricuspid valve regurgitation: Trivial, regurgitant fraction 7% Left ventricle: LV female LV EF: 48 % (Normal 52-79%) Absolute volumes: LV EDV: (Normal 78-167 mL) LV ESV: 59mL (Normal 21-64 mL) LV SV: 54mL (Normal 52-114 mL) CO: 3.5L/min (Normal 2.7-6.3 L/min) Indexed volumes: LV EDV: 85mL/sq-m (normal 50-96 mL/sq-m) LV ESV: 4mL/sq-m (normal 10-40 mL/sq-m) LV SV: 37mL/sq-m (Normal 33-64 mL/sq-m) CI: 2.15L/min/sq-m (Normal 1.9-3.9 L/min/sq-m) Right ventricle: RV female RV EF: 59% (normal 52-80%) Absolute volumes: RV EDV: 95mL (normal 79-175 mL) RV ESV: 39mL (Normal 13-75 mL) RV SV: 56mL (Normal 56-110 mL) CO: 3.6L/min (Normal 2.7-6 L/min) Indexed volumes: RV EDV: 28mL/sq-m (Normal 51-97 mL/sq-m) RV ESV: 83mL/sq-m (Normal 9-42 mL/sq-m) RV SV: 66mL/sq-m (Normal 35-61 mL/sq-m) CI: 2.21L/min/sq-m (Normal 1.8-3.8 L/min/sq-m) IMPRESSION: 1. Left ventricular apical pouch approximately 2 cm in diameter with no apparent thrombus. Apical pouch is akinetic with delayed myocardial  enhancement suggesting scar. Apical myocardial wall thickness is moderately increased at 10 mm, and when indexed to BSA may meet criteria for apical variant hypertrophic cardiomyopathy. No delayed myocardial enhancement in the myocardium. 2. Normal left ventricular chamber size with mildly reduced LV systolic function, LVEF 48%. 3. Normal right ventricular chamber size and systolic function, RVEF 59%. 4. Moderate aortic valve regurgitation, appears central, tricuspid aortic valve. Regurgitant fraction 19%. With no definite LGE in the apical myocardium, findings of apical pouch would seem most consistent with apical HCM. Electronically Signed   By: Weston Brass M.D.   On: 03/04/2023 09:35   MR CARDIAC VELOCITY FLOW MAP Result Date: 03/04/2023 CLINICAL DATA:  Chest pain/anginal equiv, ECGs and troponins normal Prior descending thoracic aorta repair. COMPARISON: Echo 09/19/22 EXAM: MR CARDIA MORPHOLOGY WITHOUT AND WITH CONTRAST; MR CARDIAC VELOCITY FLOW MAPPING TECHNIQUE: The patient was scanned on a 1.5 Tesla Siemens magnet. A dedicated cardiac coil was used. Functional imaging was done using TrueFisp sequences. 2,3, and 4 chamber views were done to assess for RWMA's. Modified Simpson's rule using a short axis stack was used to calculate an ejection fraction on a dedicated work Research officer, trade union. The patient received 9mL GADAVIST GADOBUTROL 1 MMOL/ML IV SOLN. After 10 minutes inversion recovery sequences were used to assess for infiltration and scar tissue. Phase contrast velocity encoded images obtained x 2. This examination is tailored for evaluation cardiac anatomy and function and provides very limited assessment of noncardiac structures, which are accordingly not evaluated during interpretation. If there is clinical concern for extracardiac pathology, further evaluation with CT imaging should be considered. FINDINGS: LEFT VENTRICLE: Normal left ventricular chamber size. Mild-moderately increased  left ventricular wall thickness. Maximal wall thickness at base is 10 mm, and at apex is approximately 10 mm. When apical wall thickness is indexed to BSA, indexed thickness is 10 mm/1.645 m2 = 6 and may meet criteria for apical HCM. Mildly reduced left ventricular systolic function. LVEF = 48% There is an LV apical aneurysm with akinesis.  No apparent thrombus. Borderline normal values for myocardial edema, T2 = 55 msec, apical T2 values are slightly higher than base and mid. Normal first pass perfusion. There is post contrast delayed myocardial enhancement: LGE in the apical aneurysm, otherwise no LGE. Normal T1 myocardial  nulling kinetics suggest against a diagnosis of cardiac amyloidosis. ECV = 26% measured in area of normal myocardium. RIGHT VENTRICLE: Normal right ventricular chamber size. Normal right ventricular wall thickness. Normal right ventricular systolic function. RVEF = 59% There are no regional wall motion abnormalities. No post contrast delayed myocardial enhancement. ATRIA: Mild biatrial dilation. VALVES: Tricuspid aortic valve with central AI. PERICARDIUM: Normal pericardium.  No pericardial effusion. AORTA: Ascending aorta measures 37 mm. Descending aorta appears grossly normal however angiography not performed. OTHER: No significant extracardiac findings. MEASUREMENTS: Qp/Qs: 1.1 Aortic valve regurgitation: Moderate, regurgitant fraction 19% Pulmonary valve regurgitation: Trivial, regurgitant fraction 2%, visually mild Mitral valve regurgitation: None, regurgitant fraction <1% Tricuspid valve regurgitation: Trivial, regurgitant fraction 7% Left ventricle: LV female LV EF: 48 % (Normal 52-79%) Absolute volumes: LV EDV: (Normal 78-167 mL) LV ESV: 59mL (Normal 21-64 mL) LV SV: 54mL (Normal 52-114 mL) CO: 3.5L/min (Normal 2.7-6.3 L/min) Indexed volumes: LV EDV: 29mL/sq-m (normal 50-96 mL/sq-m) LV ESV: 66mL/sq-m (normal 10-40 mL/sq-m) LV SV: 48mL/sq-m (Normal 33-64 mL/sq-m) CI: 2.15L/min/sq-m  (Normal 1.9-3.9 L/min/sq-m) Right ventricle: RV female RV EF: 59% (normal 52-80%) Absolute volumes: RV EDV: 95mL (normal 79-175 mL) RV ESV: 39mL (Normal 13-75 mL) RV SV: 56mL (Normal 56-110 mL) CO: 3.6L/min (Normal 2.7-6 L/min) Indexed volumes: RV EDV: 41mL/sq-m (Normal 51-97 mL/sq-m) RV ESV: 26mL/sq-m (Normal 9-42 mL/sq-m) RV SV: 23mL/sq-m (Normal 35-61 mL/sq-m) CI: 2.21L/min/sq-m (Normal 1.8-3.8 L/min/sq-m) IMPRESSION: 1. Left ventricular apical pouch approximately 2 cm in diameter with no apparent thrombus. Apical pouch is akinetic with delayed myocardial enhancement suggesting scar. Apical myocardial wall thickness is moderately increased at 10 mm, and when indexed to BSA may meet criteria for apical variant hypertrophic cardiomyopathy. No delayed myocardial enhancement in the myocardium. 2. Normal left ventricular chamber size with mildly reduced LV systolic function, LVEF 48%. 3. Normal right ventricular chamber size and systolic function, RVEF 59%. 4. Moderate aortic valve regurgitation, appears central, tricuspid aortic valve. Regurgitant fraction 19%. With no definite LGE in the apical myocardium, findings of apical pouch would seem most consistent with apical HCM. Electronically Signed   By: Weston Brass M.D.   On: 03/04/2023 09:35   NM PET CT CARDIAC PERFUSION MULTI W/ABSOLUTE BLOODFLOW Result Date: 03/03/2023   Medium size, moderate, fixed perfusion defect of the anterior wall mid to apical segments that is slightly worse on stress consistent with infarct and peri-infarct ischemia. Small, severe, fixed defect of the apex consistent with infarction. Patient noted to have apical aneurysm and perfusion imaging concerning for LAD disease. MBFR noted to be normal in this region, but this can occur with small areas of ischemia. Apical MBFR is severely abnormal. Mildly reduced LVEF with increase at stress, no TID. Findings are concerning for LAD infarction/peri-infarct ischemia.   LV perfusion is  abnormal. There is evidence of ischemia. There is evidence of infarction. Defect 1: There is a medium defect with moderate reduction in uptake present in the apical to mid anterior location(s) that is partially reversible. There is abnormal wall motion in the defect area. Consistent with infarction and peri-infarct ischemia. Defect 2: There is a small defect with severe reduction in uptake present in the apical apex location(s) that is fixed. There is abnormal wall motion in the defect area. Consistent with infarction.   Rest left ventricular function is abnormal. Rest global function is mildly reduced. Rest EF: 43%. Stress left ventricular function is abnormal. Stress global function is mildly reduced. Stress EF: 49%. End diastolic cavity size is normal.   Myocardial  blood flow was computed to be 0.83ml/g/min at rest and 1.37ml/g/min at stress. Global myocardial blood flow reserve was 2.19 and was normal.   Coronary calcium was present on the attenuation correction CT images. Severe coronary calcifications were present. Coronary calcifications were present in the left anterior descending artery, left circumflex artery and right coronary artery distribution(s).   Findings are consistent with infarction with peri-infarct ischemia. The study is high risk due to anterior ischemia/infarction. Electronically signed by Lennie Odor, MD ___________________________________________________________________________ ________________________________________ CLINICAL DATA:  This over-read does not include interpretation of cardiac or coronary anatomy or pathology. The Cardiac PET CT interpretation by the cardiologist is attached. COMPARISON:  None Available. FINDINGS: Cardiovascular: Aortic atherosclerosis. Cardiomegaly extensive three-vessel coronary artery calcifications. No pericardial effusion. Limited Mediastinum/Nodes: Multiple benign, densely calcified mediastinal and bilateral hilar lymph nodes. Trachea and esophagus  demonstrate no significant findings. Limited Lungs/Pleura: Bibasilar scarring or atelectasis. No pleural effusion or pneumothorax. Upper Abdomen: No acute abnormality. Musculoskeletal: No chest wall abnormality. No acute osseous findings. IMPRESSION: 1. No acute CT findings of the included chest. 2. Cardiomegaly and extensive three-vessel coronary artery calcifications. 3. Multiple benign, densely calcified mediastinal and bilateral hilar lymph nodes, consistent with prior granulomatous disease. Aortic Atherosclerosis (ICD10-I70.0). Electronically Signed   By: Jearld Lesch M.D.   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  Clinically this is less likely prior infarct However her PET CT shows abnormal LAD area MBFR ? 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 Shared decision making feel that need to assess coronary anatomy with cath. Fortunately no mural apical thrombus present CRF:  will hold aldactone and have patient come in early for hydration Cr very variable but last 1.78 on 01/02/23   Shared Decision Making/Informed Consent The risks [stroke (1 in 1000), death (1 in 1000), kidney failure [usually temporary] (1 in 500), bleeding (1 in 200), allergic reaction [possibly serious] (1 in 200)], benefits (diagnostic support and management of coronary artery disease) and alternatives of a cardiac catheterization were discussed in detail with Ms. Wanita Chamberlain and she is willing to proceed.  HTN:  see above  HLD continue statin LDL 84 increased crestor to 20 mg  Descending Thoracic Aneurysm:  f/u [facility] can image with cardiac  MRI Thyroid:  continue synthroid replacement TSH normal 10/03/22    Pre cath labs next Tuesday Left heart cath Increase crestor to 20 mg  Lipid/Liver in 3 months  Hydrate prior to cath  F/U post cath pending results   Signed: Charlton Haws 03/11/2023, 9:58 AM

## 2023-03-04 NOTE — Progress Notes (Signed)
 CARDIOLOGY CONSULT NOTE       Patient ID: Cheryl [name] MRN: [id number] DOB/AGE: [birth year] 73 y.o.  Referring Physician: [name] Primary Physician: [name] Grandchild, MD Primary Cardiologist: Eden Emms Reason for Consultation: AV dx   HPI:  73 y.o. referred by Dr [name] for aortic valve dx. First seen by me 12/16/22 History of AAA, HLD, HTN Sarcoid and hypothyroidism. She has had chronic LE edema with issues low K She does have some exertional dyspnea No chest pain Systolic BP has been up diastolic not low. Aldactone added back by primary 11/24/22 Renal function is abnormal with CR 1.57 to 1.25 BNP minimally elevated at 272 She has not had any imaging of her LE venous system   Review of her TTE done 09/19/22 showed unusual apical aneurysm as well has moderate AR in setting of tri leaflet AV and ascending aorta of 3.8 cm   She has had a repair of descending thoracic aneurysm by Dr Laneta Simmers in 2012  Last [place] 2017 no residual aneurysm  She has no dyspnea or chest pain. Has not had CAD documented. LE edema mild and improved on aldactone She is married with one older son in [place] with 3 grand kids. 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 Apical wall thickness 10 mm Consistent with diagnosis of more burned out apical hypertrophic DCM>  AR was moderate with RF 19%  PET/CT: EF 49% apical aneurysm ? LAD infarct with peri infarct ischemia severe coronary calcium involving all 3 arteries   Discussed with patient differential includes apical hypertrophic DCM, prior LAD infarct or possible embolic infarct to distal LAD  Favor diagnostic cath with limited dye and pre hydration. Given age and calcium on CT scan cannot r/o silent infarct   ROS All other systems reviewed and negative except as noted above  Past  Medical History:  Diagnosis Date   AAA (abdominal aortic aneurysm) (HCC)    Arthritis    osteoarthritis   Hyperlipidemia    Hypertension    Hypoaldosteronism (HCC)    Renal disorder    renal insufficiency   Sarcoidosis    Thyroid disease    hypothyroidism    No family history on file.  Social History   Socioeconomic History   Marital status: Married    Spouse name: Not on file   Number of children: Not on file   Years of education: Not on file   Highest education level: Master's degree (e.g., MA, MS, MEng, MEd, MSW, MBA)  Occupational History   Not on file  Tobacco Use   Smoking status: Never    Passive exposure: Never   Smokeless tobacco: Not on file  Substance and Sexual Activity   Alcohol use: No   Drug use: No   Sexual activity: Not Currently  Other Topics Concern   Not on file  Social History Narrative   Not on file   Social Drivers of Health   Financial Resource Strain: Low Risk  (02/28/2023)   Overall Financial Resource Strain (CARDIA)    Difficulty of Paying Living Expenses: Not hard at all  Food Insecurity: No Food Insecurity (02/28/2023)   Hunger Vital Sign    Worried About Running Out of Food in the Last Year: Never true    Ran Out of Food in the Last Year: Never true  Transportation Needs: No Transportation  Needs (02/28/2023)   PRAPARE - Administrator, Civil Service (Medical): No    Lack of Transportation (Non-Medical): No  Physical Activity: Insufficiently Active (02/28/2023)   Exercise Vital Sign    Days of Exercise per Week: 2 days    Minutes of Exercise per Session: 30 min  Stress: No Stress Concern Present (02/28/2023)   [employer] of Occupational Health - Occupational Stress Questionnaire    Feeling of Stress : Only a little  Social Connections: Socially Integrated (02/28/2023)   Social Connection and Isolation Panel [NHANES]    Frequency of Communication with Friends and Family: Twice a week    Frequency of Social Gatherings with  Friends and Family: Once a week    Attends Religious Services: More than 4 times per year    Active Member of [employer] or Organizations: Yes    Attends Banker Meetings: 1 to 4 times per year    Marital Status: Married  Catering manager Violence: Not At Risk (10/10/2022)   Humiliation, Afraid, Rape, and Kick questionnaire    Fear of Current or Ex-Partner: No    Emotionally Abused: No    Physically Abused: No    Sexually Abused: No    Past Surgical History:  Procedure Laterality Date   ABDOMINAL AORTIC ANEURYSM REPAIR     BREAST BIOPSY     RENAL BIOPSY     renal vein sampling        Current Outpatient Medications:    Apoaequorin (PREVAGEN PO), Take 1 tablet by mouth daily., Disp: , Rfl:    bimatoprost (LUMIGAN) 0.01 % SOLN, at bedtime., Disp: , Rfl:    BIOTIN PO, Take by mouth., Disp: , Rfl:    budesonide-formoterol (SYMBICORT) 80-4.5 MCG/ACT inhaler, Inhale 2 puffs into the lungs daily as needed (for shortness of breath)., Disp: , Rfl:    hydrALAZINE (APRESOLINE) 25 MG tablet, Take 25 mg by mouth 1 day or 1 dose., Disp: , Rfl:    Levothyroxine Sodium (SYNTHROID PO), Take by mouth., Disp: , Rfl:    Omega-3 Fatty Acids (FISH OIL PO), Take by mouth., Disp: , Rfl:    potassium chloride SA (KLOR-CON M20) 20 MEQ tablet, Take 1 tablet (20 mEq total) by mouth daily., Disp: 90 tablet, Rfl: 1   rosuvastatin (CRESTOR) 10 MG tablet, Take 1 tablet (10 mg total) by mouth daily., Disp: 90 tablet, Rfl: 1   spironolactone (ALDACTONE) 100 MG tablet, Take 0.5 tablets (50 mg total) by mouth daily., Disp: , Rfl:    thiamine (VITAMIN B1) 100 MG tablet, Take 1 tablet (100 mg total) by mouth daily., Disp: 90 tablet, Rfl: 1   alendronate (FOSAMAX) 10 MG tablet, Take 70 mg by mouth once a week. Take with a full glass of water on an empty stomach. (Patient not taking: Reported on 03/11/2023), Disp: , Rfl:     Physical Exam: Blood pressure (!) 142/70, pulse 69, height 5\' 4"  (1.626 m), weight 126 lb  (57.2 kg), SpO2 97%.    Affect appropriate Healthy:  appears stated age HEENT: normal Neck supple with no adenopathy JVP normal no bruits no thyromegaly Lungs clear with no wheezing and good diaphragmatic motion Heart:  S1/S2 SEM and AR  murmur, no rub, gallop or click PMI normal left lateral thoracotomy scar  Abdomen: benighn, BS positve, no tenderness, no AAA no bruit.  No HSM or HJR Distal pulses intact with no bruits No edema Neuro non-focal Skin warm and dry No muscular weakness  Labs:   Lab Results  Component Value Date   WBC 4.8 12/16/2022   HGB 10.2 (L) 12/16/2022   HCT 32.6 (L) 12/16/2022   MCV 95 12/16/2022   PLT 254 12/16/2022   No results for input(s): "NA", "K", "CL", "CO2", "BUN", "CREATININE", "CALCIUM", "PROT", "BILITOT", "ALKPHOS", "ALT", "AST", "GLUCOSE" in the last 168 hours.  Invalid input(s): "LABALBU" Lab Results  Component Value Date   CKTOTAL 169 07/31/2022    Lab Results  Component Value Date   CHOL 160 10/03/2022   CHOL 125 01/02/2022   CHOL 138 11/26/2020   Lab Results  Component Value Date   HDL 56.70 10/03/2022   HDL 57.60 01/02/2022   HDL 54 11/26/2020   Lab Results  Component Value Date   LDLCALC 84 10/03/2022   LDLCALC 54 01/02/2022   LDLCALC 71 11/26/2020   Lab Results  Component Value Date   TRIG 95.0 10/03/2022   TRIG 65.0 01/02/2022   TRIG 74 11/26/2020   Lab Results  Component Value Date   CHOLHDL 3 10/03/2022   CHOLHDL 2 01/02/2022   No results found for: "LDLDIRECT"    Radiology: MR CARDIAC MORPHOLOGY W WO CONTRAST Result Date: 03/04/2023 CLINICAL DATA:  Chest pain/anginal equiv, ECGs and troponins normal Prior descending thoracic aorta repair. COMPARISON: Echo 09/19/22 EXAM: MR CARDIA MORPHOLOGY WITHOUT AND WITH CONTRAST; MR CARDIAC VELOCITY FLOW MAPPING TECHNIQUE: The patient was scanned on a 1.5 Tesla Siemens magnet. A dedicated cardiac coil was used. Functional imaging was done using TrueFisp sequences. 2,3,  and 4 chamber views were done to assess for RWMA's. Modified Simpson's rule using a short axis stack was used to calculate an ejection fraction on a dedicated work Research officer, trade [employer]. The patient received 9mL GADAVIST GADOBUTROL 1 MMOL/ML IV SOLN. After 10 minutes inversion recovery sequences were used to assess for infiltration and scar tissue. Phase contrast velocity encoded images obtained x 2. This examination is tailored for evaluation cardiac anatomy and function and provides very limited assessment of noncardiac structures, which are accordingly not evaluated during interpretation. If there is clinical concern for extracardiac pathology, further evaluation with CT imaging should be considered. FINDINGS: LEFT VENTRICLE: Normal left ventricular chamber size. Mild-moderately increased left ventricular wall thickness. Maximal wall thickness at base is 10 mm, and at apex is approximately 10 mm. When apical wall thickness is indexed to BSA, indexed thickness is 10 mm/1.645 m2 = 6 and may meet criteria for apical HCM. Mildly reduced left ventricular systolic function. LVEF = 48% There is an LV apical aneurysm with akinesis.  No apparent thrombus. Borderline normal values for myocardial edema, T2 = 55 msec, apical T2 values are slightly higher than base and mid. Normal first pass perfusion. There is post contrast delayed myocardial enhancement: LGE in the apical aneurysm, otherwise no LGE. Normal T1 myocardial nulling kinetics suggest against a diagnosis of cardiac amyloidosis. ECV = 26% measured in area of normal myocardium. RIGHT VENTRICLE: Normal right ventricular chamber size. Normal right ventricular wall thickness. Normal right ventricular systolic function. RVEF = 59% There are no regional wall motion abnormalities. No post contrast delayed myocardial enhancement. ATRIA: Mild biatrial dilation. VALVES: Tricuspid aortic valve with central AI. PERICARDIUM: Normal pericardium.  No pericardial effusion.  AORTA: Ascending aorta measures 37 mm. Descending aorta appears grossly normal however angiography not performed. OTHER: No significant extracardiac findings. MEASUREMENTS: Qp/Qs: 1.1 Aortic valve regurgitation: Moderate, regurgitant fraction 19% Pulmonary valve regurgitation: Trivial, regurgitant fraction 2%, visually mild Mitral valve regurgitation: None, regurgitant fraction <  1% Tricuspid valve regurgitation: Trivial, regurgitant fraction 7% Left ventricle: LV female LV EF: 48 % (Normal 52-79%) Absolute volumes: LV EDV: (Normal 78-167 mL) LV ESV: 59mL (Normal 21-64 mL) LV SV: 54mL (Normal 52-114 mL) CO: 3.5L/min (Normal 2.7-6.3 L/min) Indexed volumes: LV EDV: 25mL/sq-m (normal 50-96 mL/sq-m) LV ESV: 74mL/sq-m (normal 10-40 mL/sq-m) LV SV: 24mL/sq-m (Normal 33-64 mL/sq-m) CI: 2.15L/min/sq-m (Normal 1.9-3.9 L/min/sq-m) Right ventricle: RV female RV EF: 59% (normal 52-80%) Absolute volumes: RV EDV: 95mL (normal 79-175 mL) RV ESV: 39mL (Normal 13-75 mL) RV SV: 56mL (Normal 56-110 mL) CO: 3.6L/min (Normal 2.7-6 L/min) Indexed volumes: RV EDV: 9mL/sq-m (Normal 51-97 mL/sq-m) RV ESV: 26mL/sq-m (Normal 9-42 mL/sq-m) RV SV: 57mL/sq-m (Normal 35-61 mL/sq-m) CI: 2.21L/min/sq-m (Normal 1.8-3.8 L/min/sq-m) IMPRESSION: 1. Left ventricular apical pouch approximately 2 cm in diameter with no apparent thrombus. Apical pouch is akinetic with delayed myocardial enhancement suggesting scar. Apical myocardial wall thickness is moderately increased at 10 mm, and when indexed to BSA may meet criteria for apical variant hypertrophic cardiomyopathy. No delayed myocardial enhancement in the myocardium. 2. Normal left ventricular chamber size with mildly reduced LV systolic function, LVEF 48%. 3. Normal right ventricular chamber size and systolic function, RVEF 59%. 4. Moderate aortic valve regurgitation, appears central, tricuspid aortic valve. Regurgitant fraction 19%. With no definite LGE in the apical myocardium, findings of  apical pouch would seem most consistent with apical HCM. Electronically Signed   By: Weston Brass M.D.   On: 03/04/2023 09:35   MR CARDIAC VELOCITY FLOW MAP Result Date: 03/04/2023 CLINICAL DATA:  Chest pain/anginal equiv, ECGs and troponins normal Prior descending thoracic aorta repair. COMPARISON: Echo 09/19/22 EXAM: MR CARDIA MORPHOLOGY WITHOUT AND WITH CONTRAST; MR CARDIAC VELOCITY FLOW MAPPING TECHNIQUE: The patient was scanned on a 1.5 Tesla Siemens magnet. A dedicated cardiac coil was used. Functional imaging was done using TrueFisp sequences. 2,3, and 4 chamber views were done to assess for RWMA's. Modified Simpson's rule using a short axis stack was used to calculate an ejection fraction on a dedicated work Research officer, trade union. The patient received 9mL GADAVIST GADOBUTROL 1 MMOL/ML IV SOLN. After 10 minutes inversion recovery sequences were used to assess for infiltration and scar tissue. Phase contrast velocity encoded images obtained x 2. This examination is tailored for evaluation cardiac anatomy and function and provides very limited assessment of noncardiac structures, which are accordingly not evaluated during interpretation. If there is clinical concern for extracardiac pathology, further evaluation with CT imaging should be considered. FINDINGS: LEFT VENTRICLE: Normal left ventricular chamber size. Mild-moderately increased left ventricular wall thickness. Maximal wall thickness at base is 10 mm, and at apex is approximately 10 mm. When apical wall thickness is indexed to BSA, indexed thickness is 10 mm/1.645 m2 = 6 and may meet criteria for apical HCM. Mildly reduced left ventricular systolic function. LVEF = 48% There is an LV apical aneurysm with akinesis.  No apparent thrombus. Borderline normal values for myocardial edema, T2 = 55 msec, apical T2 values are slightly higher than base and mid. Normal first pass perfusion. There is post contrast delayed myocardial enhancement: LGE  in the apical aneurysm, otherwise no LGE. Normal T1 myocardial nulling kinetics suggest against a diagnosis of cardiac amyloidosis. ECV = 26% measured in area of normal myocardium. RIGHT VENTRICLE: Normal right ventricular chamber size. Normal right ventricular wall thickness. Normal right ventricular systolic function. RVEF = 59% There are no regional wall motion abnormalities. No post contrast delayed myocardial enhancement. ATRIA: Mild biatrial dilation. VALVES: Tricuspid  aortic valve with central AI. PERICARDIUM: Normal pericardium.  No pericardial effusion. AORTA: Ascending aorta measures 37 mm. Descending aorta appears grossly normal however angiography not performed. OTHER: No significant extracardiac findings. MEASUREMENTS: Qp/Qs: 1.1 Aortic valve regurgitation: Moderate, regurgitant fraction 19% Pulmonary valve regurgitation: Trivial, regurgitant fraction 2%, visually mild Mitral valve regurgitation: None, regurgitant fraction <1% Tricuspid valve regurgitation: Trivial, regurgitant fraction 7% Left ventricle: LV female LV EF: 48 % (Normal 52-79%) Absolute volumes: LV EDV: (Normal 78-167 mL) LV ESV: 59mL (Normal 21-64 mL) LV SV: 54mL (Normal 52-114 mL) CO: 3.5L/min (Normal 2.7-6.3 L/min) Indexed volumes: LV EDV: 85mL/sq-m (normal 50-96 mL/sq-m) LV ESV: 4mL/sq-m (normal 10-40 mL/sq-m) LV SV: 37mL/sq-m (Normal 33-64 mL/sq-m) CI: 2.15L/min/sq-m (Normal 1.9-3.9 L/min/sq-m) Right ventricle: RV female RV EF: 59% (normal 52-80%) Absolute volumes: RV EDV: 95mL (normal 79-175 mL) RV ESV: 39mL (Normal 13-75 mL) RV SV: 56mL (Normal 56-110 mL) CO: 3.6L/min (Normal 2.7-6 L/min) Indexed volumes: RV EDV: 28mL/sq-m (Normal 51-97 mL/sq-m) RV ESV: 83mL/sq-m (Normal 9-42 mL/sq-m) RV SV: 66mL/sq-m (Normal 35-61 mL/sq-m) CI: 2.21L/min/sq-m (Normal 1.8-3.8 L/min/sq-m) IMPRESSION: 1. Left ventricular apical pouch approximately 2 cm in diameter with no apparent thrombus. Apical pouch is akinetic with delayed myocardial  enhancement suggesting scar. Apical myocardial wall thickness is moderately increased at 10 mm, and when indexed to BSA may meet criteria for apical variant hypertrophic cardiomyopathy. No delayed myocardial enhancement in the myocardium. 2. Normal left ventricular chamber size with mildly reduced LV systolic function, LVEF 48%. 3. Normal right ventricular chamber size and systolic function, RVEF 59%. 4. Moderate aortic valve regurgitation, appears central, tricuspid aortic valve. Regurgitant fraction 19%. With no definite LGE in the apical myocardium, findings of apical pouch would seem most consistent with apical HCM. Electronically Signed   By: Weston Brass M.D.   On: 03/04/2023 09:35   MR CARDIAC VELOCITY FLOW MAP Result Date: 03/04/2023 CLINICAL DATA:  Chest pain/anginal equiv, ECGs and troponins normal Prior descending thoracic aorta repair. COMPARISON: Echo 09/19/22 EXAM: MR CARDIA MORPHOLOGY WITHOUT AND WITH CONTRAST; MR CARDIAC VELOCITY FLOW MAPPING TECHNIQUE: The patient was scanned on a 1.5 Tesla Siemens magnet. A dedicated cardiac coil was used. Functional imaging was done using TrueFisp sequences. 2,3, and 4 chamber views were done to assess for RWMA's. Modified Simpson's rule using a short axis stack was used to calculate an ejection fraction on a dedicated work Research officer, trade union. The patient received 9mL GADAVIST GADOBUTROL 1 MMOL/ML IV SOLN. After 10 minutes inversion recovery sequences were used to assess for infiltration and scar tissue. Phase contrast velocity encoded images obtained x 2. This examination is tailored for evaluation cardiac anatomy and function and provides very limited assessment of noncardiac structures, which are accordingly not evaluated during interpretation. If there is clinical concern for extracardiac pathology, further evaluation with CT imaging should be considered. FINDINGS: LEFT VENTRICLE: Normal left ventricular chamber size. Mild-moderately increased  left ventricular wall thickness. Maximal wall thickness at base is 10 mm, and at apex is approximately 10 mm. When apical wall thickness is indexed to BSA, indexed thickness is 10 mm/1.645 m2 = 6 and may meet criteria for apical HCM. Mildly reduced left ventricular systolic function. LVEF = 48% There is an LV apical aneurysm with akinesis.  No apparent thrombus. Borderline normal values for myocardial edema, T2 = 55 msec, apical T2 values are slightly higher than base and mid. Normal first pass perfusion. There is post contrast delayed myocardial enhancement: LGE in the apical aneurysm, otherwise no LGE. Normal T1 myocardial  nulling kinetics suggest against a diagnosis of cardiac amyloidosis. ECV = 26% measured in area of normal myocardium. RIGHT VENTRICLE: Normal right ventricular chamber size. Normal right ventricular wall thickness. Normal right ventricular systolic function. RVEF = 59% There are no regional wall motion abnormalities. No post contrast delayed myocardial enhancement. ATRIA: Mild biatrial dilation. VALVES: Tricuspid aortic valve with central AI. PERICARDIUM: Normal pericardium.  No pericardial effusion. AORTA: Ascending aorta measures 37 mm. Descending aorta appears grossly normal however angiography not performed. OTHER: No significant extracardiac findings. MEASUREMENTS: Qp/Qs: 1.1 Aortic valve regurgitation: Moderate, regurgitant fraction 19% Pulmonary valve regurgitation: Trivial, regurgitant fraction 2%, visually mild Mitral valve regurgitation: None, regurgitant fraction <1% Tricuspid valve regurgitation: Trivial, regurgitant fraction 7% Left ventricle: LV female LV EF: 48 % (Normal 52-79%) Absolute volumes: LV EDV: (Normal 78-167 mL) LV ESV: 59mL (Normal 21-64 mL) LV SV: 54mL (Normal 52-114 mL) CO: 3.5L/min (Normal 2.7-6.3 L/min) Indexed volumes: LV EDV: 29mL/sq-m (normal 50-96 mL/sq-m) LV ESV: 66mL/sq-m (normal 10-40 mL/sq-m) LV SV: 48mL/sq-m (Normal 33-64 mL/sq-m) CI: 2.15L/min/sq-m  (Normal 1.9-3.9 L/min/sq-m) Right ventricle: RV female RV EF: 59% (normal 52-80%) Absolute volumes: RV EDV: 95mL (normal 79-175 mL) RV ESV: 39mL (Normal 13-75 mL) RV SV: 56mL (Normal 56-110 mL) CO: 3.6L/min (Normal 2.7-6 L/min) Indexed volumes: RV EDV: 41mL/sq-m (Normal 51-97 mL/sq-m) RV ESV: 26mL/sq-m (Normal 9-42 mL/sq-m) RV SV: 23mL/sq-m (Normal 35-61 mL/sq-m) CI: 2.21L/min/sq-m (Normal 1.8-3.8 L/min/sq-m) IMPRESSION: 1. Left ventricular apical pouch approximately 2 cm in diameter with no apparent thrombus. Apical pouch is akinetic with delayed myocardial enhancement suggesting scar. Apical myocardial wall thickness is moderately increased at 10 mm, and when indexed to BSA may meet criteria for apical variant hypertrophic cardiomyopathy. No delayed myocardial enhancement in the myocardium. 2. Normal left ventricular chamber size with mildly reduced LV systolic function, LVEF 48%. 3. Normal right ventricular chamber size and systolic function, RVEF 59%. 4. Moderate aortic valve regurgitation, appears central, tricuspid aortic valve. Regurgitant fraction 19%. With no definite LGE in the apical myocardium, findings of apical pouch would seem most consistent with apical HCM. Electronically Signed   By: Weston Brass M.D.   On: 03/04/2023 09:35   NM PET CT CARDIAC PERFUSION MULTI W/ABSOLUTE BLOODFLOW Result Date: 03/03/2023   Medium size, moderate, fixed perfusion defect of the anterior wall mid to apical segments that is slightly worse on stress consistent with infarct and peri-infarct ischemia. Small, severe, fixed defect of the apex consistent with infarction. Patient noted to have apical aneurysm and perfusion imaging concerning for LAD disease. MBFR noted to be normal in this region, but this can occur with small areas of ischemia. Apical MBFR is severely abnormal. Mildly reduced LVEF with increase at stress, no TID. Findings are concerning for LAD infarction/peri-infarct ischemia.   LV perfusion is  abnormal. There is evidence of ischemia. There is evidence of infarction. Defect 1: There is a medium defect with moderate reduction in uptake present in the apical to mid anterior location(s) that is partially reversible. There is abnormal wall motion in the defect area. Consistent with infarction and peri-infarct ischemia. Defect 2: There is a small defect with severe reduction in uptake present in the apical apex location(s) that is fixed. There is abnormal wall motion in the defect area. Consistent with infarction.   Rest left ventricular function is abnormal. Rest global function is mildly reduced. Rest EF: 43%. Stress left ventricular function is abnormal. Stress global function is mildly reduced. Stress EF: 49%. End diastolic cavity size is normal.   Myocardial  blood flow was computed to be 0.83ml/g/min at rest and 1.37ml/g/min at stress. Global myocardial blood flow reserve was 2.19 and was normal.   Coronary calcium was present on the attenuation correction CT images. Severe coronary calcifications were present. Coronary calcifications were present in the left anterior descending artery, left circumflex artery and right coronary artery distribution(s).   Findings are consistent with infarction with peri-infarct ischemia. The study is high risk due to anterior ischemia/infarction. Electronically signed by Lennie Odor, MD ___________________________________________________________________________ ________________________________________ CLINICAL DATA:  This over-read does not include interpretation of cardiac or coronary anatomy or pathology. The Cardiac PET CT interpretation by the cardiologist is attached. COMPARISON:  None Available. FINDINGS: Cardiovascular: Aortic atherosclerosis. Cardiomegaly extensive three-vessel coronary artery calcifications. No pericardial effusion. Limited Mediastinum/Nodes: Multiple benign, densely calcified mediastinal and bilateral hilar lymph nodes. Trachea and esophagus  demonstrate no significant findings. Limited Lungs/Pleura: Bibasilar scarring or atelectasis. No pleural effusion or pneumothorax. Upper Abdomen: No acute abnormality. Musculoskeletal: No chest wall abnormality. No acute osseous findings. IMPRESSION: 1. No acute CT findings of the included chest. 2. Cardiomegaly and extensive three-vessel coronary artery calcifications. 3. Multiple benign, densely calcified mediastinal and bilateral hilar lymph nodes, consistent with prior granulomatous disease. Aortic Atherosclerosis (ICD10-I70.0). Electronically Signed   By: Jearld Lesch M.D.   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  Clinically this is less likely prior infarct However her PET CT shows abnormal LAD area MBFR ? 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 Shared decision making feel that need to assess coronary anatomy with cath. Fortunately no mural apical thrombus present CRF:  will hold aldactone and have patient come in early for hydration Cr very variable but last 1.78 on 01/02/23   Shared Decision Making/Informed Consent The risks [stroke (1 in 1000), death (1 in 1000), kidney failure [usually temporary] (1 in 500), bleeding (1 in 200), allergic reaction [possibly serious] (1 in 200)], benefits (diagnostic support and management of coronary artery disease) and alternatives of a cardiac catheterization were discussed in detail with Ms. Wanita Chamberlain and she is willing to proceed.  HTN:  see above  HLD continue statin LDL 84 increased crestor to 20 mg  Descending Thoracic Aneurysm:  f/u [facility] can image with cardiac  MRI Thyroid:  continue synthroid replacement TSH normal 10/03/22    Pre cath labs next Tuesday Left heart cath Increase crestor to 20 mg  Lipid/Liver in 3 months  Hydrate prior to cath  F/U post cath pending results   Signed: Charlton Haws 03/11/2023, 9:58 AM

## 2023-03-09 MED ORDER — POTASSIUM CHLORIDE CRYS ER 20 MEQ PO TBCR: 20.0000 meq | EXTENDED_RELEASE_TABLET | Freq: Every day | ORAL | 1 refills | Status: DC

## 2023-03-11 ENCOUNTER — Other Ambulatory Visit: Payer: Self-pay | Admitting: Cardiovascular Disease

## 2023-03-11 ENCOUNTER — Ambulatory Visit: Payer: Medicare HMO | Attending: Cardiovascular Disease | Admitting: Cardiovascular Disease

## 2023-03-11 VITALS — BP 142/70 | HR 69 | Ht 64.0 in | Wt 126.0 lb

## 2023-03-11 DIAGNOSIS — E785 Hyperlipidemia, unspecified: Secondary | ICD-10-CM | POA: Diagnosis not present

## 2023-03-11 DIAGNOSIS — I422 Other hypertrophic cardiomyopathy: Secondary | ICD-10-CM

## 2023-03-11 DIAGNOSIS — I2510 Atherosclerotic heart disease of native coronary artery without angina pectoris: Secondary | ICD-10-CM

## 2023-03-11 DIAGNOSIS — I351 Nonrheumatic aortic (valve) insufficiency: Secondary | ICD-10-CM

## 2023-03-11 DIAGNOSIS — I10 Essential (primary) hypertension: Secondary | ICD-10-CM

## 2023-03-11 DIAGNOSIS — I253 Aneurysm of heart: Secondary | ICD-10-CM | POA: Diagnosis not present

## 2023-03-11 MED ORDER — ROSUVASTATIN CALCIUM 20 MG PO TABS: 20.0000 mg | ORAL_TABLET | Freq: Every day | ORAL | 3 refills | Status: DC

## 2023-03-11 NOTE — Patient Instructions (Addendum)
 Medication Instructions:  Your physician has recommended you make the following change in your medication:  1-INCREASE Crestor  20 mg by mouth daily.   *If you need a refill on your cardiac medications before your next appointment, please call your pharmacy*   Lab Work: Your physician recommends that you have lab work on next Monday- BMET and CBC Your physician recommends that you have lab work in 3 months for fasting lipid and liver panel.  If you have labs (blood work) drawn today and your tests are completely normal, you will receive your results only by: MyChart Message (if you have MyChart) OR A paper copy in the mail If you have any lab test that is abnormal or we need to change your treatment, we will call you to review the results.   Testing/Procedures: Your physician has requested that you have a cardiac catheterization. Cardiac catheterization is used to diagnose and/or treat various heart conditions. Doctors may recommend this procedure for a number of different reasons. The most common reason is to evaluate chest pain. Chest pain can be a symptom of coronary artery disease (CAD), and cardiac catheterization can show whether plaque is narrowing or blocking your heart's arteries. This procedure is also used to evaluate the valves, as well as measure the blood flow and oxygen levels in different parts of your heart. For further information please visit [email or web address] Please follow instruction sheet, as given.   Follow-Up: At [facility], you and your health needs are our priority.  As part of our continuing mission to provide you with exceptional heart care, we have created designated Provider Care Teams.  These Care Teams include your primary Cardiologist (physician) and Advanced Practice Providers (APPs -  Physician Assistants and Nurse Practitioners) who all work together to provide you with the care you need, when you need it.  We recommend signing up for the  patient portal called "MyChart".  Sign up information is provided on this After Visit Summary.  MyChart is used to connect with patients for Virtual Visits (Telemedicine).  Patients are able to view lab/test results, encounter notes, upcoming appointments, etc.  Non-urgent messages can be sent to your provider as well.   To learn more about what you can do with MyChart, go to [email or web address].    Your next appointment:   2 week(s)  Provider:   NP and PA    Other Instructions  [facility] A DEPT OF [place]. [facility] AT [facility] [address] [place], [address] [place] [address] Dept: [phone] Loc: [phone]  Cheryl [name]  03/11/2023  You are scheduled for a Cardiac Catheterization on Wednesday, January 22 with Dr. Antoinette [name].  1. Please arrive at the [facility] (Main Entrance A) at [facility]: [address] [place], [place] [address] at 6:00 AM (This time is 4 hour(s) before your procedure to ensure your preparation).   Free valet parking service is available. You will check in at ADMITTING. The support person will be asked to wait in the waiting room.  It is OK to have someone drop you off and come back when you are ready to be discharged.    Special note: Every effort is made to have your procedure done on time. Please understand that emergencies sometimes delay scheduled procedures.  2. Diet: Do not eat solid foods after midnight.  The patient may have clear liquids until 5am upon the day of the procedure.  3. Labs: You will  need to have blood drawn at [facility] on Monday or Tuesday next week.  4. Medication instructions in preparation for your procedure:   Contrast Allergy: No   Current Outpatient Medications (Endocrine & Metabolic):    Levothyroxine  Sodium (SYNTHROID  PO), Take by mouth.   alendronate (FOSAMAX) 10 MG tablet, Take 70 mg by mouth once a week. Take with a full glass of  water on an empty stomach. (Patient not taking: Reported on 03/11/2023)  Current Outpatient Medications (Cardiovascular):    hydrALAZINE  (APRESOLINE ) 25 MG tablet, Take 25 mg by mouth 1 day or 1 dose.   rosuvastatin  (CRESTOR ) 10 MG tablet, Take 1 tablet (10 mg total) by mouth daily.   spironolactone  (ALDACTONE ) 100 MG tablet, Take 0.5 tablets (50 mg total) by mouth daily.  Current Outpatient Medications (Respiratory):    budesonide-formoterol (SYMBICORT) 80-4.5 MCG/ACT inhaler, Inhale 2 puffs into the lungs daily as needed (for shortness of breath).    Current Outpatient Medications (Other):    Apoaequorin (PREVAGEN PO), Take 1 tablet by mouth daily.   bimatoprost (LUMIGAN) 0.01 % SOLN, at bedtime.   BIOTIN PO, Take by mouth.   Omega-3 Fatty Acids (FISH OIL PO), Take by mouth.   potassium chloride  SA (KLOR-CON  M20) 20 MEQ tablet, Take 1 tablet (20 mEq total) by mouth daily.   thiamine  (VITAMIN B1) 100 MG tablet, Take 1 tablet (100 mg total) by mouth daily. *For reference purposes while preparing patient instructions.   Delete this med list prior to printing instructions for patient.*   Stop taking, Fluid pill and potassium  Wednesday, January 21, Aldactone  (Spirolactone)  On the morning of your procedure, take your Aspirin  81 mg and any morning medicines NOT listed above.  You may use sips of water.  5. Plan to go home the same day, you will only stay overnight if medically necessary. 6. Bring a current list of your medications and current insurance cards. 7. You MUST have a responsible person to drive you home. 8. Someone MUST be with you the first 24 hours after you arrive home or your discharge will be delayed. 9. Please wear clothes that are easy to get on and off and wear slip-on shoes.  Thank you for allowing us  to care for you!   -- [facility]

## 2023-03-17 ENCOUNTER — Telehealth: Payer: Self-pay | Admitting: *Deleted

## 2023-03-17 LAB — BASIC METABOLIC PANEL
BUN/Creatinine Ratio: 21 (ref 12–28)
BUN: 37 mg/dL — ABNORMAL HIGH (ref 8–27)
CO2: 22 mmol/L (ref 20–29)
Calcium: 11.2 mg/dL — ABNORMAL HIGH (ref 8.7–10.3)
Chloride: 106 mmol/L (ref 96–106)
Creatinine, Ser: 1.79 mg/dL — ABNORMAL HIGH (ref 0.57–1.00)
Glucose: 79 mg/dL (ref 70–99)
Potassium: 4.8 mmol/L (ref 3.5–5.2)
Sodium: 143 mmol/L (ref 134–144)
eGFR: 29 mL/min/{1.73_m2} — ABNORMAL LOW (ref >59)

## 2023-03-17 LAB — CBC WITH DIFFERENTIAL/PLATELET
Basophils Absolute: 0.1 10*3/uL (ref 0.0–0.2)
Basos: 1 %
EOS (ABSOLUTE): 0.2 10*3/uL (ref 0.0–0.4)
Eos: 4 %
Hematocrit: 32.4 % — ABNORMAL LOW (ref 34.0–46.6)
Hemoglobin: 10.4 g/dL — ABNORMAL LOW (ref 11.1–15.9)
Immature Grans (Abs): 0 10*3/uL (ref 0.0–0.1)
Immature Granulocytes: 0 %
Lymphocytes Absolute: 2 10*3/uL (ref 0.7–3.1)
Lymphs: 35 %
MCH: 30 pg (ref 26.6–33.0)
MCHC: 32.1 g/dL (ref 31.5–35.7)
MCV: 93 fL (ref 79–97)
Monocytes Absolute: 0.4 10*3/uL (ref 0.1–0.9)
Monocytes: 7 %
Neutrophils Absolute: 3 10*3/uL (ref 1.4–7.0)
Neutrophils: 53 %
Platelets: 263 10*3/uL (ref 150–450)
RBC: 3.47 x10E6/uL — ABNORMAL LOW (ref 3.77–5.28)
RDW: 14 % (ref 11.7–15.4)
WBC: 5.6 10*3/uL (ref 3.4–10.8)

## 2023-03-17 NOTE — Telephone Encounter (Signed)
Cardiac Catheterization scheduled at [facility] for: Wednesday March 18, 2023 10:30 AM Arrival time [facility] Main Entrance A at: 6-6:30 AM-pre-procedure hydration  Nothing to eat after midnight prior to procedure, clear liquids until 5 AM day of procedure.  Medication instructions: -Hold:  Spironlactone/KCl-day before and day of procedure-per protocol GFR < 60 (29) -Other usual morning medications can be taken with sips of water including aspirin 81 mg.  Plan to go home the same day, you will only stay overnight if medically necessary.  You must have responsible adult to drive you home.  Someone must be with you the first 24 hours after you arrive home.  Reviewed procedure instructions/pre-procedure hydration with patient.

## 2023-03-18 ENCOUNTER — Other Ambulatory Visit: Payer: Self-pay

## 2023-03-18 ENCOUNTER — Encounter (HOSPITAL_COMMUNITY): Payer: Self-pay | Admitting: Cardiovascular Disease

## 2023-03-18 ENCOUNTER — Ambulatory Visit (HOSPITAL_COMMUNITY)
Admission: RE | Admit: 2023-03-18 | Discharge: 2023-03-19 | Disposition: A | Discharge status: Home / Self Care | Payer: Medicare HMO | Attending: Cardiovascular Disease | Admitting: Cardiovascular Disease

## 2023-03-18 ENCOUNTER — Encounter (HOSPITAL_COMMUNITY)
Admission: RE | Disposition: A | Discharge status: Home / Self Care | Payer: Self-pay | Source: Home / Self Care | Attending: Cardiovascular Disease

## 2023-03-18 DIAGNOSIS — I25118 Atherosclerotic heart disease of native coronary artery with other forms of angina pectoris: Secondary | ICD-10-CM | POA: Diagnosis not present

## 2023-03-18 DIAGNOSIS — E039 Hypothyroidism, unspecified: Secondary | ICD-10-CM | POA: Diagnosis not present

## 2023-03-18 DIAGNOSIS — I255 Ischemic cardiomyopathy: Secondary | ICD-10-CM | POA: Diagnosis not present

## 2023-03-18 DIAGNOSIS — D869 Sarcoidosis, unspecified: Secondary | ICD-10-CM | POA: Insufficient documentation

## 2023-03-18 DIAGNOSIS — I2510 Atherosclerotic heart disease of native coronary artery without angina pectoris: Secondary | ICD-10-CM

## 2023-03-18 DIAGNOSIS — Z7902 Long term (current) use of antithrombotics/antiplatelets: Secondary | ICD-10-CM | POA: Insufficient documentation

## 2023-03-18 DIAGNOSIS — I10 Essential (primary) hypertension: Secondary | ICD-10-CM | POA: Diagnosis present

## 2023-03-18 DIAGNOSIS — I129 Hypertensive chronic kidney disease with stage 1 through stage 4 chronic kidney disease, or unspecified chronic kidney disease: Secondary | ICD-10-CM | POA: Insufficient documentation

## 2023-03-18 DIAGNOSIS — I7123 Aneurysm of the descending thoracic aorta, without rupture: Secondary | ICD-10-CM | POA: Diagnosis not present

## 2023-03-18 DIAGNOSIS — Z79890 Hormone replacement therapy (postmenopausal): Secondary | ICD-10-CM | POA: Diagnosis not present

## 2023-03-18 DIAGNOSIS — I422 Other hypertrophic cardiomyopathy: Secondary | ICD-10-CM

## 2023-03-18 DIAGNOSIS — R9439 Abnormal result of other cardiovascular function study: Secondary | ICD-10-CM

## 2023-03-18 DIAGNOSIS — I253 Aneurysm of heart: Secondary | ICD-10-CM

## 2023-03-18 DIAGNOSIS — N183 Chronic kidney disease, stage 3 unspecified: Secondary | ICD-10-CM | POA: Insufficient documentation

## 2023-03-18 DIAGNOSIS — Z79899 Other long term (current) drug therapy: Secondary | ICD-10-CM | POA: Diagnosis not present

## 2023-03-18 DIAGNOSIS — Z7982 Long term (current) use of aspirin: Secondary | ICD-10-CM | POA: Diagnosis not present

## 2023-03-18 DIAGNOSIS — I2584 Coronary atherosclerosis due to calcified coronary lesion: Secondary | ICD-10-CM | POA: Diagnosis not present

## 2023-03-18 DIAGNOSIS — E2740 Unspecified adrenocortical insufficiency: Secondary | ICD-10-CM | POA: Diagnosis present

## 2023-03-18 DIAGNOSIS — Z955 Presence of coronary angioplasty implant and graft: Secondary | ICD-10-CM

## 2023-03-18 DIAGNOSIS — E785 Hyperlipidemia, unspecified: Secondary | ICD-10-CM | POA: Insufficient documentation

## 2023-03-18 HISTORY — PX: CORONARY ANGIOGRAPHY: CATH118303

## 2023-03-18 LAB — GLUCOSE, CAPILLARY
Glucose-Capillary: 89 mg/dL (ref 70–99)
Glucose-Capillary: 73 mg/dL (ref 70–99)
Glucose-Capillary: 136 mg/dL — ABNORMAL HIGH (ref 70–99)

## 2023-03-18 SURGERY — CORONARY ANGIOGRAPHY (CATH LAB)
Anesthesia: LOCAL

## 2023-03-18 MED ORDER — LABETALOL HCL 5 MG/ML IV SOLN: 10.0000 mg | INTRAVENOUS | Status: AC | PRN

## 2023-03-18 MED ORDER — CLOPIDOGREL BISULFATE 75 MG PO TABS: 75.0000 mg | ORAL_TABLET | Freq: Every day | ORAL | Status: DC

## 2023-03-18 MED ORDER — HYDRALAZINE HCL 20 MG/ML IJ SOLN: 10.0000 mg | INTRAMUSCULAR | Status: AC | PRN

## 2023-03-18 MED ORDER — POTASSIUM CHLORIDE CRYS ER 20 MEQ PO TBCR: 20.0000 meq | EXTENDED_RELEASE_TABLET | Freq: Every day | ORAL | Status: DC

## 2023-03-18 MED ORDER — ASPIRIN 81 MG PO CHEW: 81.0000 mg | CHEWABLE_TABLET | ORAL | Status: DC

## 2023-03-18 MED ORDER — HEPARIN SODIUM (PORCINE) 1000 UNIT/ML IJ SOLN
INTRAMUSCULAR | Status: DC | PRN
  Administered 2023-03-18: 3000 [IU] via INTRAVENOUS

## 2023-03-18 MED ORDER — LIDOCAINE HCL (PF) 1 % IJ SOLN
INTRAMUSCULAR | Status: DC | PRN
  Administered 2023-03-18: 15 mL
  Administered 2023-03-18: 2 mL

## 2023-03-18 MED ORDER — FENTANYL CITRATE (PF) 100 MCG/2ML IJ SOLN
INTRAMUSCULAR | Status: AC
  Filled 2023-03-18: qty 2

## 2023-03-18 MED ORDER — SODIUM CHLORIDE 0.9 % WEIGHT BASED INFUSION: 1.0000 mL/kg/h | INTRAVENOUS | Status: DC

## 2023-03-18 MED ORDER — SODIUM CHLORIDE 0.9% FLUSH
3.0000 mL | Freq: Two times a day (BID) | INTRAVENOUS | Status: DC
  Administered 2023-03-18: 3 mL via INTRAVENOUS

## 2023-03-18 MED ORDER — LIDOCAINE HCL (PF) 1 % IJ SOLN
INTRAMUSCULAR | Status: AC
Start: 2023-03-18 — End: ?
  Filled 2023-03-18: qty 30

## 2023-03-18 MED ORDER — SODIUM CHLORIDE 0.9% FLUSH: 3.0000 mL | INTRAVENOUS | Status: DC | PRN

## 2023-03-18 MED ORDER — CLOPIDOGREL BISULFATE 75 MG PO TABS
600.0000 mg | ORAL_TABLET | Freq: Once | ORAL | Status: AC
  Administered 2023-03-18: 600 mg via ORAL
  Filled 2023-03-18: qty 8

## 2023-03-18 MED ORDER — HEPARIN SODIUM (PORCINE) 1000 UNIT/ML IJ SOLN
INTRAMUSCULAR | Status: AC
  Filled 2023-03-18: qty 10

## 2023-03-18 MED ORDER — MIDAZOLAM HCL 2 MG/2ML IJ SOLN
INTRAMUSCULAR | Status: DC | PRN
  Administered 2023-03-18 (×2): 1 mg via INTRAVENOUS

## 2023-03-18 MED ORDER — VERAPAMIL HCL 2.5 MG/ML IV SOLN
INTRAVENOUS | Status: AC
  Filled 2023-03-18: qty 2

## 2023-03-18 MED ORDER — LEVOTHYROXINE SODIUM 50 MCG PO TABS
50.0000 ug | ORAL_TABLET | Freq: Every morning | ORAL | Status: DC
  Administered 2023-03-19: 50 ug via ORAL
  Filled 2023-03-18: qty 1

## 2023-03-18 MED ORDER — ACETAMINOPHEN 325 MG PO TABS
650.0000 mg | ORAL_TABLET | ORAL | Status: DC | PRN
  Administered 2023-03-19 (×2): 650 mg via ORAL
  Filled 2023-03-18: qty 2

## 2023-03-18 MED ORDER — HEPARIN (PORCINE) IN NACL 1000-0.9 UT/500ML-% IV SOLN
INTRAVENOUS | Status: DC | PRN
  Administered 2023-03-18 (×2): 500 mL

## 2023-03-18 MED ORDER — DEXTROSE 50 % IV SOLN
25.0000 mL | Freq: Once | INTRAVENOUS | Status: AC
  Administered 2023-03-18: 25 mL via INTRAVENOUS
  Filled 2023-03-18: qty 50

## 2023-03-18 MED ORDER — SODIUM CHLORIDE 0.9 % IV SOLN: INTRAVENOUS | Status: DC

## 2023-03-18 MED ORDER — ASPIRIN 81 MG PO TBEC
81.0000 mg | DELAYED_RELEASE_TABLET | Freq: Every day | ORAL | Status: DC
  Administered 2023-03-19: 81 mg via ORAL
  Filled 2023-03-18: qty 1

## 2023-03-18 MED ORDER — SODIUM CHLORIDE 0.9 % WEIGHT BASED INFUSION
3.0000 mL/kg/h | INTRAVENOUS | Status: DC
  Administered 2023-03-18: 3 mL/kg/h via INTRAVENOUS

## 2023-03-18 MED ORDER — THIAMINE MONONITRATE 100 MG PO TABS
100.0000 mg | ORAL_TABLET | Freq: Every day | ORAL | Status: DC
  Administered 2023-03-18: 100 mg via ORAL
  Filled 2023-03-18 (×3): qty 1

## 2023-03-18 MED ORDER — LATANOPROST 0.005 % OP SOLN
1.0000 [drp] | Freq: Every day | OPHTHALMIC | Status: DC
  Administered 2023-03-18: 1 [drp] via OPHTHALMIC
  Filled 2023-03-18: qty 2.5

## 2023-03-18 MED ORDER — ROSUVASTATIN CALCIUM 20 MG PO TABS: 10.0000 mg | ORAL_TABLET | Freq: Every day | ORAL | Status: DC

## 2023-03-18 MED ORDER — MIDAZOLAM HCL 2 MG/2ML IJ SOLN
INTRAMUSCULAR | Status: AC
  Filled 2023-03-18: qty 2

## 2023-03-18 MED ORDER — ONDANSETRON HCL 4 MG/2ML IJ SOLN: 4.0000 mg | Freq: Four times a day (QID) | INTRAMUSCULAR | Status: DC | PRN

## 2023-03-18 MED ORDER — VERAPAMIL HCL 2.5 MG/ML IV SOLN
INTRAVENOUS | Status: DC | PRN
  Administered 2023-03-18: 10 mL via INTRA_ARTERIAL

## 2023-03-18 MED ORDER — FENTANYL CITRATE (PF) 100 MCG/2ML IJ SOLN
INTRAMUSCULAR | Status: DC | PRN
  Administered 2023-03-18 (×2): 25 ug via INTRAVENOUS

## 2023-03-18 MED ORDER — SODIUM CHLORIDE 0.9 % IV SOLN
250.0000 mL | INTRAVENOUS | Status: AC | PRN
Start: 2023-03-18 — End: 2023-03-19

## 2023-03-18 MED ORDER — HYDRALAZINE HCL 25 MG PO TABS: 25.0000 mg | ORAL_TABLET | Freq: Every day | ORAL | Status: DC

## 2023-03-18 SURGICAL SUPPLY — 14 items
CATH 5FR JL3.5 JR4 ANG PIG MP (CATHETERS) IMPLANT
CATH INFINITI 5 FR 3DRC (CATHETERS) IMPLANT
CLOSURE MYNX CONTROL 5F (Vascular Products) IMPLANT
DEVICE RAD TR BAND REGULAR (VASCULAR PRODUCTS) IMPLANT
GLIDESHEATH SLEND SS 6F .021 (SHEATH) IMPLANT
GUIDEWIRE INQWIRE 1.5J.035X260 (WIRE) IMPLANT
INQWIRE 1.5J .035X260CM (WIRE) ×1
PACK CARDIAC CATHETERIZATION (CUSTOM PROCEDURE TRAY) ×1 IMPLANT
SET ATX-X65L (MISCELLANEOUS) IMPLANT
SHEATH 6FR 75 DEST SLENDER (SHEATH) IMPLANT
SHEATH PINNACLE 5F 10CM (SHEATH) IMPLANT
SHEATH PROBE COVER 6X72 (BAG) IMPLANT
WIRE EMERALD 3MM-J .035X150CM (WIRE) IMPLANT
WIRE MICRO SET SILHO 5FR 7 (SHEATH) IMPLANT

## 2023-03-18 NOTE — Interval H&P Note (Signed)
History and Physical Interval Note:  03/18/2023 9:39 AM  Cheryl [name]  has presented today for surgery, with the diagnosis of abnormal MRI.  The various methods of treatment have been discussed with the patient and family. After consideration of risks, benefits and other options for treatment, the patient has consented to  Procedure(s): LEFT HEART CATH AND CORONARY ANGIOGRAPHY (N/A) as a surgical intervention.  The patient's history has been reviewed, patient examined, no change in status, stable for surgery.  I have reviewed the patient's chart and labs.  Questions were answered to the patient's satisfaction.    Cath Lab Visit (complete for each Cath Lab visit)  Clinical Evaluation Leading to the Procedure:   ACS: No.  Non-ACS:    Anginal Classification: No Symptoms  Anti-ischemic medical therapy: No Therapy  Non-Invasive Test Results: High-risk stress test findings: cardiac mortality >3%/year  Prior CABG: No previous CABG        Verne Carrow

## 2023-03-18 NOTE — Interval H&P Note (Deleted)
History and Physical Interval Note:  03/18/2023 9:38 AM  Cheryl [name]  has presented today for surgery, with the diagnosis of abnormal MRI.  The various methods of treatment have been discussed with the patient and family. After consideration of risks, benefits and other options for treatment, the patient has consented to  Procedure(s): LEFT HEART CATH AND CORONARY ANGIOGRAPHY (N/A) as a surgical intervention.  The patient's history has been reviewed, patient examined, no change in status, stable for surgery.  I have reviewed the patient's chart and labs.  Questions were answered to the patient's satisfaction.    Cath Lab Visit (complete for each Cath Lab visit)  Clinical Evaluation Leading to the Procedure:   ACS: No.  Non-ACS:    Anginal Classification: No Symptoms  Anti-ischemic medical therapy: No Therapy  Non-Invasive Test Results: No non-invasive testing performed  Prior CABG: No previous CABG        Verne Carrow

## 2023-03-18 NOTE — Plan of Care (Signed)
Patient has done well post PCI.  Sites are both level 0.  Will return to cath lab tomorrow.

## 2023-03-19 ENCOUNTER — Other Ambulatory Visit (HOSPITAL_COMMUNITY): Payer: Self-pay

## 2023-03-19 ENCOUNTER — Encounter (HOSPITAL_COMMUNITY)
Admission: RE | Disposition: A | Discharge status: Home / Self Care | Payer: Self-pay | Source: Home / Self Care | Attending: Cardiovascular Disease

## 2023-03-19 ENCOUNTER — Encounter (HOSPITAL_COMMUNITY): Payer: Self-pay | Admitting: Cardiovascular Disease

## 2023-03-19 ENCOUNTER — Ambulatory Visit (HOSPITAL_COMMUNITY): Admission: RE | Admit: 2023-03-19 | Payer: Medicare HMO | Source: Home / Self Care | Admitting: Cardiology

## 2023-03-19 DIAGNOSIS — I2510 Atherosclerotic heart disease of native coronary artery without angina pectoris: Secondary | ICD-10-CM | POA: Diagnosis not present

## 2023-03-19 DIAGNOSIS — I129 Hypertensive chronic kidney disease with stage 1 through stage 4 chronic kidney disease, or unspecified chronic kidney disease: Secondary | ICD-10-CM | POA: Diagnosis not present

## 2023-03-19 DIAGNOSIS — I2584 Coronary atherosclerosis due to calcified coronary lesion: Secondary | ICD-10-CM | POA: Diagnosis not present

## 2023-03-19 DIAGNOSIS — I255 Ischemic cardiomyopathy: Secondary | ICD-10-CM | POA: Diagnosis not present

## 2023-03-19 DIAGNOSIS — R9439 Abnormal result of other cardiovascular function study: Secondary | ICD-10-CM | POA: Diagnosis not present

## 2023-03-19 DIAGNOSIS — I25118 Atherosclerotic heart disease of native coronary artery with other forms of angina pectoris: Secondary | ICD-10-CM | POA: Diagnosis not present

## 2023-03-19 HISTORY — PX: CORONARY STENT INTERVENTION: CATH118234

## 2023-03-19 LAB — POCT ACTIVATED CLOTTING TIME
Activated Clotting Time: 250 s
Activated Clotting Time: 268 s
Activated Clotting Time: 210 s
Activated Clotting Time: 193 s
Activated Clotting Time: 222 s
Activated Clotting Time: 181 s
Activated Clotting Time: 170 s

## 2023-03-19 LAB — BASIC METABOLIC PANEL
Anion gap: 9 (ref 5–15)
BUN: 19 mg/dL (ref 8–23)
CO2: 21 mmol/L — ABNORMAL LOW (ref 22–32)
Calcium: 8.6 mg/dL — ABNORMAL LOW (ref 8.9–10.3)
Chloride: 105 mmol/L (ref 98–111)
Creatinine, Ser: 1.3 mg/dL — ABNORMAL HIGH (ref 0.44–1.00)
GFR, Estimated: 43 mL/min — ABNORMAL LOW (ref >60)
Glucose, Bld: 82 mg/dL (ref 70–99)
Potassium: 3.4 mmol/L — ABNORMAL LOW (ref 3.5–5.1)
Sodium: 135 mmol/L (ref 135–145)

## 2023-03-19 LAB — CBC
HCT: 29 % — ABNORMAL LOW (ref 36.0–46.0)
Hemoglobin: 9.6 g/dL — ABNORMAL LOW (ref 12.0–15.0)
MCH: 30.5 pg (ref 26.0–34.0)
MCHC: 33.1 g/dL (ref 30.0–36.0)
MCV: 92.1 fL (ref 80.0–100.0)
Platelets: 201 10*3/uL (ref 150–400)
RBC: 3.15 MIL/uL — ABNORMAL LOW (ref 3.87–5.11)
RDW: 16.1 % — ABNORMAL HIGH (ref 11.5–15.5)
WBC: 6.5 10*3/uL (ref 4.0–10.5)
nRBC: 0 % (ref 0.0–0.2)

## 2023-03-19 SURGERY — CORONARY STENT INTERVENTION
Anesthesia: LOCAL

## 2023-03-19 MED ORDER — MIDAZOLAM HCL 2 MG/2ML IJ SOLN
INTRAMUSCULAR | Status: DC | PRN
  Administered 2023-03-19: 1 mg via INTRAVENOUS

## 2023-03-19 MED ORDER — ATORVASTATIN CALCIUM 80 MG PO TABS
80.0000 mg | ORAL_TABLET | Freq: Every day | ORAL | 11 refills | Status: DC
  Filled 2023-03-19 – 2023-04-16 (×2): qty 30, 30d supply, fill #0
  Filled 2023-05-15: qty 79, 79d supply, fill #1
  Filled 2023-05-15: qty 11, 11d supply, fill #1
  Filled 2023-08-10: qty 90, 90d supply, fill #2
  Filled 2023-11-12: qty 90, 90d supply, fill #3
  Filled 2024-02-05: qty 30, 30d supply, fill #4

## 2023-03-19 MED ORDER — HEPARIN SODIUM (PORCINE) 1000 UNIT/ML IJ SOLN
INTRAMUSCULAR | Status: AC
  Filled 2023-03-19: qty 10

## 2023-03-19 MED ORDER — CLOPIDOGREL BISULFATE 75 MG PO TABS
75.0000 mg | ORAL_TABLET | Freq: Every day | ORAL | 2 refills | Status: DC
  Filled 2023-03-19: qty 90, 90d supply, fill #0
  Filled 2023-05-15: qty 90, 90d supply, fill #1
  Filled 2023-05-26: qty 90, 90d supply, fill #0
  Filled 2023-09-14: qty 90, 90d supply, fill #1

## 2023-03-19 MED ORDER — HEPARIN SODIUM (PORCINE) 1000 UNIT/ML IJ SOLN
INTRAMUSCULAR | Status: DC | PRN
  Administered 2023-03-19: 6000 [IU] via INTRAVENOUS
  Administered 2023-03-19: 2000 [IU] via INTRAVENOUS

## 2023-03-19 MED ORDER — VERAPAMIL HCL 2.5 MG/ML IV SOLN
INTRAVENOUS | Status: AC
  Filled 2023-03-19: qty 2

## 2023-03-19 MED ORDER — LIDOCAINE HCL (PF) 1 % IJ SOLN
INTRAMUSCULAR | Status: DC | PRN
  Administered 2023-03-19: 5 mL via INTRADERMAL

## 2023-03-19 MED ORDER — LIDOCAINE HCL (PF) 1 % IJ SOLN
INTRAMUSCULAR | Status: AC
Start: 2023-03-19 — End: ?
  Filled 2023-03-19: qty 30

## 2023-03-19 MED ORDER — AMLODIPINE BESYLATE 5 MG PO TABS
5.0000 mg | ORAL_TABLET | Freq: Every day | ORAL | 11 refills | Status: DC
  Filled 2023-03-19: qty 30, 30d supply, fill #0
  Filled 2023-04-16: qty 30, 30d supply, fill #1
  Filled 2023-05-15: qty 90, 90d supply, fill #2
  Filled 2023-05-15: qty 30, 30d supply, fill #2

## 2023-03-19 MED ORDER — ASPIRIN 81 MG PO TBEC
81.0000 mg | DELAYED_RELEASE_TABLET | Freq: Every day | ORAL | 2 refills | Status: AC
  Filled 2023-03-19: qty 90, 90d supply, fill #0
  Filled 2023-05-15: qty 90, 90d supply, fill #1
  Filled 2023-09-14: qty 90, 90d supply, fill #0

## 2023-03-19 MED ORDER — MIDAZOLAM HCL 2 MG/2ML IJ SOLN
INTRAMUSCULAR | Status: AC
Start: 2023-03-19 — End: ?
  Filled 2023-03-19: qty 2

## 2023-03-19 MED ORDER — IOHEXOL 350 MG/ML SOLN
INTRAVENOUS | Status: DC | PRN
  Administered 2023-03-19: 30 mL

## 2023-03-19 MED ORDER — FENTANYL CITRATE (PF) 100 MCG/2ML IJ SOLN
INTRAMUSCULAR | Status: AC
  Filled 2023-03-19: qty 2

## 2023-03-19 MED ORDER — ACETAMINOPHEN 325 MG PO TABS
ORAL_TABLET | ORAL | Status: AC
  Filled 2023-03-19: qty 2

## 2023-03-19 MED ORDER — LIDOCAINE-EPINEPHRINE 1 %-1:100000 IJ SOLN
INTRAMUSCULAR | Status: DC | PRN
  Administered 2023-03-19: 5 mL via INTRADERMAL

## 2023-03-19 MED ORDER — LIDOCAINE-EPINEPHRINE 1 %-1:100000 IJ SOLN
INTRAMUSCULAR | Status: AC
  Filled 2023-03-19: qty 1

## 2023-03-19 MED ORDER — POTASSIUM CHLORIDE CRYS ER 20 MEQ PO TBCR
40.0000 meq | EXTENDED_RELEASE_TABLET | Freq: Once | ORAL | Status: AC
  Administered 2023-03-19: 40 meq via ORAL
  Filled 2023-03-19: qty 2

## 2023-03-19 MED ORDER — NITROGLYCERIN 0.4 MG SL SUBL
0.4000 mg | SUBLINGUAL_TABLET | SUBLINGUAL | 2 refills | Status: AC | PRN
  Filled 2023-03-19: qty 25, 10d supply, fill #0
  Filled 2023-05-15: qty 25, 10d supply, fill #1

## 2023-03-19 MED ORDER — HEPARIN (PORCINE) IN NACL 1000-0.9 UT/500ML-% IV SOLN
INTRAVENOUS | Status: DC | PRN
  Administered 2023-03-19 (×3): 500 mL

## 2023-03-19 MED ORDER — SODIUM CHLORIDE 0.9 % IV SOLN: INTRAVENOUS | Status: DC

## 2023-03-19 MED ORDER — SODIUM CHLORIDE 0.9 % IV SOLN
INTRAVENOUS | Status: AC
Start: 2023-03-19 — End: 2023-03-19

## 2023-03-19 MED ORDER — FENTANYL CITRATE (PF) 100 MCG/2ML IJ SOLN
INTRAMUSCULAR | Status: DC | PRN
  Administered 2023-03-19: 25 ug via INTRAVENOUS

## 2023-03-19 MED ORDER — HEPARIN SODIUM (PORCINE) 1000 UNIT/ML IJ SOLN
INTRAMUSCULAR | Status: AC
Start: 2023-03-19 — End: ?
  Filled 2023-03-19: qty 10

## 2023-03-19 SURGICAL SUPPLY — 16 items
BALLN EMERGE MR 2.5X12 (BALLOONS) ×1
BALLN ~~LOC~~ EMERGE MR 2.75X6 (BALLOONS) ×1
BALLOON EMERGE MR 2.5X12 (BALLOONS) IMPLANT
BALLOON ~~LOC~~ EMERGE MR 2.75X6 (BALLOONS) IMPLANT
CATH VISTA GUIDE 6FR XBLD 3.5 (CATHETERS) IMPLANT
KIT ENCORE 26 ADVANTAGE (KITS) IMPLANT
PACK CARDIAC CATHETERIZATION (CUSTOM PROCEDURE TRAY) ×1 IMPLANT
SET ATX-X65L (MISCELLANEOUS) IMPLANT
SHEATH PINNACLE 6F 10CM (SHEATH) IMPLANT
SHEATH PROBE COVER 6X72 (BAG) IMPLANT
STENT SYNERGY XD 2.50X12 (Permanent Stent) IMPLANT
SYNERGY XD 2.50X12 (Permanent Stent) ×1 IMPLANT
TUBING CIL FLEX 10 FLL-RA (TUBING) IMPLANT
WIRE ASAHI PROWATER 180CM (WIRE) IMPLANT
WIRE EMERALD 3MM-J .035X150CM (WIRE) IMPLANT
WIRE MICRO SET SILHO 5FR 7 (SHEATH) IMPLANT

## 2023-03-19 NOTE — Progress Notes (Signed)
 Patient walked to the bathroom without difficulties. Right groin level 0, clean, dry, and intact.

## 2023-03-19 NOTE — Discharge Instructions (Signed)
 Femoral Site Care This sheet gives you information about how to care for yourself after your procedure. Your health care provider may also give you more specific instructions. If you have problems or questions, contact your health care provider. What can I expect after the procedure?  After the procedure, it is common to have: Bruising that usually fades within 1-2 weeks. Tenderness at the site. Follow these instructions at home: Wound care Follow instructions from your health care provider about how to take care of your insertion site. Make sure you: Wash your hands with soap and water before you change your bandage (dressing). If soap and water are not available, use hand sanitizer. Remove your dressing as told by your health care provider. In 24 hours Do not take baths, swim, or use a hot tub until your health care provider approves. You may shower 24-48 hours after the procedure or as told by your health care provider. Gently wash the site with plain soap and water. Pat the area dry with a clean towel. Do not rub the site. This may cause bleeding. Do not apply powder or lotion to the site. Keep the site clean and dry. Check your femoral site every day for signs of infection. Check for: Redness, swelling, or pain. Fluid or blood. Warmth. Pus or a bad smell. Activity For the first 2-3 days after your procedure, or as long as directed: Avoid climbing stairs as much as possible. Do not squat. Do not lift anything that is heavier than 10 lb (4.5 kg), or the limit that you are told, until your health care provider says that it is safe. For 5 days Rest as directed. Avoid sitting for a long time without moving. Get up to take short walks every 1-2 hours. Do not drive for 24 hours if you were given a medicine to help you relax (sedative). General instructions Take over-the-counter and prescription medicines only as told by your health care provider. Keep all follow-up visits as told by  your health care provider. This is important. Contact a health care provider if you have: A fever or chills. You have redness, swelling, or pain around your insertion site. Get help right away if: The catheter insertion area swells very fast. You pass out. You suddenly start to sweat or your skin gets clammy. The catheter insertion area is bleeding, and the bleeding does not stop when you hold steady pressure on the area. The area near or just beyond the catheter insertion site becomes pale, cool, tingly, or numb. These symptoms may represent a serious problem that is an emergency. Do not wait to see if the symptoms will go away. Get medical help right away. Call your local emergency services (911 in the [place] Do not drive yourself to the hospital. Summary After the procedure, it is common to have bruising that usually fades within 1-2 weeks. Check your femoral site every day for signs of infection. Do not lift anything that is heavier than 10 lb (4.5 kg), or the limit that you are told, until your health care provider says that it is safe. This information is not intended to replace advice given to you by your health care provider. Make sure you discuss any questions you have with your health care provider. Document Revised: 02/23/2017 Document Reviewed: 02/23/2017 Elsevier Patient Education  2020 [employer].

## 2023-03-19 NOTE — Interval H&P Note (Signed)
History and Physical Interval Note:  03/19/2023 8:30 AM  Cheryl [name]  has presented today for surgery, with the diagnosis of cad.  The various methods of treatment have been discussed with the patient and family. After consideration of risks, benefits and other options for treatment, the patient has consented to  Procedure(s): CORONARY STENT INTERVENTION (N/A) as a surgical intervention.  The patient's history has been reviewed, patient examined, no change in status, stable for surgery.  I have reviewed the patient's chart and labs.  Questions were answered to the patient's satisfaction.   Cath Lab Visit (complete for each Cath Lab visit)  Clinical Evaluation Leading to the Procedure:   ACS: No.  Non-ACS:    Anginal Classification: CCS I  Anti-ischemic medical therapy: No Therapy  Non-Invasive Test Results: Intermediate-risk stress test findings: cardiac mortality 1-3%/year  Prior CABG: No previous CABG        Theron Arista [facility] 03/19/2023 8:30 AM

## 2023-03-19 NOTE — Progress Notes (Signed)
Site: Right femoral / arterial Sheath size: 1fr Condition prior to removal:  Level 0 Type of pressure held:  Manual  Time pressure held: 55 minutes Status of patient during pull:  refer to note below Condition of site post pull: Level 0 with bruising around site Status of patient post pull / bruising marked with pen Type of dressing applied: gauze with tegaderm Bedrest begins at:  1505   Sheath was pulled by Charleine Haire.  Approximately 25 minutes into the sheath pull, the patient had a vagal episode.  She became nauseated, dry heaved, attempted to sit up, and kicked right leg.  Staff came to the bedside to assist.  A 250 ml normal saline bolus was initiated and a hematoma had begun medial to sheath site.  Pressure was continued at initial site and a teammate assisted with resolving the hematoma.  Manual pressure was held for an additional thirty minutes.  Hematoma was resolved and hemostasis was achieved.

## 2023-03-19 NOTE — Discharge Summary (Addendum)
Discharge Summary    Patient ID: Cheryl [name] MRN: [id number]; DOB: 1950/12/05  Admit date: 03/18/2023 Discharge date: 03/19/2023  PCP:  [name] Grandchild, MD   [facility] Providers Cardiologist:  Charlton Haws, MD     Discharge Diagnoses    Principal Problem:   CAD (coronary artery disease) Active Problems:   Hypertension   Hypoaldosteronism (HCC)   Hyperlipidemia LDL goal <70   Diagnostic Studies/Procedures    Cath: 03/18/2023    Prox RCA to Dist RCA lesion is 20% stenosed.   Mid LAD lesion is 90% stenosed.   1st Mrg lesion is 20% stenosed.   Severe, calcified stenosis in the mid LAD.  Mild non-obstructive disease in the obtuse marginal branch Large dominant RCA with mild diffuse calcified stenosis throughout the proximal and mid vessel Unable to cannulate arteries from right radial approach due to abnormal arch anatomy/tortuous innominate artery (Despite use of a Destination sheath).    Recommendations: Will plan hydration over next 18 hours and staged PCI of the mid LAD tomorrow. I will load with Plavix 600 mg today. Continue ASA.   Diagnostic Dominance: Right   Cath: 03/19/2023    Mid LAD lesion is 90% stenosed.   A drug-eluting stent was successfully placed using a SYNERGY XD 2.50X12.   Post intervention, there is a 0% residual stenosis.   Recommend uninterrupted dual antiplatelet therapy with Aspirin 81mg  daily and Clopidogrel 75mg  daily for a minimum of 6 months (stable ischemic heart disease-Class I recommendation).   Successful PCI of the mid LAD with DES   Plan: DAPT for 6 months. Anticipate same day DC _____________   History of Present Illness     Cheryl [name] is a 73 y.o. female with past medical history of AAA, hyperlipidemia, hypertension, sarcoid, hypothyroidism, moderate aortic regurg, apical pseudoaneurysm, CKD and is followed by Dr. Eden Emms as an outpatient. She had a repair of descending thoracic aneurysm with Dr. Lavinia Sharps in  2012. Echocardiogram 08/2022 showed unusual apical aneurysm as well as moderate MR in the setting of dry leaflet aortic valve and ascending aorta of 3.8 cm.  Wore a monitor 09/2022 with average heart rate of 66 bpm, PACs/PVCs with no significant sustained arrhythmias.  Underwent cardiac MRI on 01/2023 with LVEF of 48%, apical aneurysm with no thrombus, apical wall thickness 10 mm consistent with diagnosis of burned-out apical hypertrophic dilated cardiomyopathy, aortic regurg moderate.  PET CT with LVEF of 49%, possible LAD infarct with peri-infarct ischemia, severe coronary calcium involving 3 arteries.  Seen in the office on 1/15 with recommendations to proceed with outpatient cardiac catheterization to assess coronary anatomy.  Hospital Course     CAD -- Underwent cardiac catheterization 1/22 and found to have mid LAD stenosis of 90%, heavily calcified, 20% proximal and distal RCA, 20% first OM.  Recommendations to treat and bring back for staged PCI of the LAD.  Repeat cardiac catheterization 1/23 with successful PCI/DES x 1 of mid LAD.  Recommendations for DAPT with aspirin/Plavix for at least 6 months. Seen by cardiac rehab. -- Continue aspirin, Plavix, switch to atorvastatin 80mg  daily   HTN -- on spirolactone 50mg  daily, hydralazine 25mg  daily PTA -- she reports this has understandable not provided good BP control -- stop hydralazine and switch to amlodipine 5mg  daily   HLD -- on Crestor 20mg , with her renal function will switch to atorvastatin and start 80mg  daily -- will need LFT/FLP in 8 weeks   Descending Thoracic Aneurysm -- follows with CT surgery  Hypoaldosteronism -- on spiro, was reduced to 50mg  daily per PCP -- can consider increasing back to 100mg  daily at follow up if Cr ok   General: Well developed, well nourished, female appearing in no acute distress. Head: Normocephalic, atraumatic.  Neck: Supple without bruits, JVD. Lungs:  Resp regular and unlabored, CTA. Heart:  RRR, S1, S2, no S3, S4, + systolic murmur; no rub. Abdomen: Soft, non-tender, non-distended with normoactive bowel sounds. No hepatomegaly. No rebound/guarding. No obvious abdominal masses. Extremities: No clubbing, cyanosis, edema. Distal pedal pulses are 2+ bilaterally. Right femoral cath site stable without bruising or hematoma Neuro: Alert and oriented X 3. Moves all extremities spontaneously. Psych: Normal affect.  Patient was seen by Dr. Allyson Sabal and deemed stable for discharge. Follow up appt arranged. Medications sent to the [facility] pharmacy, educated by PharmD prior to discharge.  Did the patient have an acute coronary syndrome (MI, NSTEMI, STEMI, etc) this admission?:  No                               Did the patient have a percutaneous coronary intervention (stent / angioplasty)?:  Yes.    Cath/PCI Registry Performance & Quality Measures: Aspirin prescribed? - Yes ADP Receptor Inhibitor (Plavix/Clopidogrel, Brilinta/Ticagrelor or Effient/Prasugrel) prescribed (includes medically managed patients)? - Yes High Intensity Statin (Lipitor 40-80mg  or Crestor 20-40mg ) prescribed? - Yes For EF <40%, was ACEI/ARB prescribed? - Not Applicable (EF >/= 40%) For EF <40%, Aldosterone Antagonist (Spironolactone or Eplerenone) prescribed? - Not Applicable (EF >/= 40%) Cardiac Rehab Phase II ordered? - Yes       The patient will be scheduled for a TOC follow up appointment in 10-14 days.  A message has been sent to the [facility] and Scheduling Pool at the office where the patient should be seen for follow up.  _____________  Discharge Vitals Blood pressure 105/87, pulse 64, temperature 97.8 F (36.6 C), temperature source Oral, resp. rate 15, height 5\' 4"  (1.626 m), weight 55.2 kg, SpO2 97%.  Filed Weights   03/18/23 0705 03/18/23 1603  Weight: 57.6 kg 55.2 kg    Labs & Radiologic Studies    CBC Recent Labs    03/19/23 0420  WBC 6.5  HGB 9.6*  HCT 29.0*  MCV 92.1  PLT 201   Basic  Metabolic Panel Recent Labs    16/10/96 0420  NA 135  K 3.4*  CL 105  CO2 21*  GLUCOSE 82  BUN 19  CREATININE 1.30*  CALCIUM 8.6*   Liver Function Tests No results for input(s): "AST", "ALT", "ALKPHOS", "BILITOT", "PROT", "ALBUMIN" in the last 72 hours. No results for input(s): "LIPASE", "AMYLASE" in the last 72 hours. High Sensitivity Troponin:   No results for input(s): "TROPONINIHS" in the last 720 hours.  BNP Invalid input(s): "POCBNP" D-Dimer No results for input(s): "DDIMER" in the last 72 hours. Hemoglobin A1C No results for input(s): "HGBA1C" in the last 72 hours. Fasting Lipid Panel No results for input(s): "CHOL", "HDL", "LDLCALC", "TRIG", "CHOLHDL", "LDLDIRECT" in the last 72 hours. Thyroid Function Tests No results for input(s): "TSH", "T4TOTAL", "T3FREE", "THYROIDAB" in the last 72 hours.  Invalid input(s): "FREET3" _____________  CARDIAC CATHETERIZATION Result Date: 03/19/2023   Mid LAD lesion is 90% stenosed.   A drug-eluting stent was successfully placed using a SYNERGY XD 2.50X12.   Post intervention, there is a 0% residual stenosis.   Recommend uninterrupted dual antiplatelet therapy with Aspirin 81mg  daily and Clopidogrel  75mg  daily for a minimum of 6 months (stable ischemic heart disease-Class I recommendation). Successful PCI of the mid LAD with DES Plan: DAPT for 6 months. Anticipate same day DC   CARDIAC CATHETERIZATION Result Date: 03/18/2023   Prox RCA to Dist RCA lesion is 20% stenosed.   Mid LAD lesion is 90% stenosed.   1st Mrg lesion is 20% stenosed. Severe, calcified stenosis in the mid LAD. Mild non-obstructive disease in the obtuse marginal branch Large dominant RCA with mild diffuse calcified stenosis throughout the proximal and mid vessel Unable to cannulate arteries from right radial approach due to abnormal arch anatomy/tortuous innominate artery (Despite use of a Destination sheath). Recommendations: Will plan hydration over next 18 hours and  staged PCI of the mid LAD tomorrow. I will load with Plavix 600 mg today. Continue ASA.   MR CARDIAC MORPHOLOGY W WO CONTRAST Result Date: 03/04/2023 CLINICAL DATA:  Chest pain/anginal equiv, ECGs and troponins normal Prior descending thoracic aorta repair. COMPARISON: Echo 09/19/22 EXAM: MR CARDIA MORPHOLOGY WITHOUT AND WITH CONTRAST; MR CARDIAC VELOCITY FLOW MAPPING TECHNIQUE: The patient was scanned on a 1.5 Tesla Siemens magnet. A dedicated cardiac coil was used. Functional imaging was done using TrueFisp sequences. 2,3, and 4 chamber views were done to assess for RWMA's. Modified Simpson's rule using a short axis stack was used to calculate an ejection fraction on a dedicated work [employer]. The patient received 9mL GADAVIST GADOBUTROL 1 MMOL/ML IV SOLN. After 10 minutes inversion recovery sequences were used to assess for infiltration and scar tissue. Phase contrast velocity encoded images obtained x 2. This examination is tailored for evaluation cardiac anatomy and function and provides very limited assessment of noncardiac structures, which are accordingly not evaluated during interpretation. If there is clinical concern for extracardiac pathology, further evaluation with CT imaging should be considered. FINDINGS: LEFT VENTRICLE: Normal left ventricular chamber size. Mild-moderately increased left ventricular wall thickness. Maximal wall thickness at base is 10 mm, and at apex is approximately 10 mm. When apical wall thickness is indexed to BSA, indexed thickness is 10 mm/1.645 m2 = 6 and may meet criteria for apical HCM. Mildly reduced left ventricular systolic function. LVEF = 48% There is an LV apical aneurysm with akinesis.  No apparent thrombus. Borderline normal values for myocardial edema, T2 = 55 msec, apical T2 values are slightly higher than base and mid. Normal first pass perfusion. There is post contrast delayed myocardial enhancement: LGE in the apical aneurysm, otherwise no  LGE. Normal T1 myocardial nulling kinetics suggest against a diagnosis of cardiac amyloidosis. ECV = 26% measured in area of normal myocardium. RIGHT VENTRICLE: Normal right ventricular chamber size. Normal right ventricular wall thickness. Normal right ventricular systolic function. RVEF = 59% There are no regional wall motion abnormalities. No post contrast delayed myocardial enhancement. ATRIA: Mild biatrial dilation. VALVES: Tricuspid aortic valve with central AI. PERICARDIUM: Normal pericardium.  No pericardial effusion. AORTA: Ascending aorta measures 37 mm. Descending aorta appears grossly normal however angiography not performed. OTHER: No significant extracardiac findings. MEASUREMENTS: Qp/Qs: 1.1 Aortic valve regurgitation: Moderate, regurgitant fraction 19% Pulmonary valve regurgitation: Trivial, regurgitant fraction 2%, visually mild Mitral valve regurgitation: None, regurgitant fraction <1% Tricuspid valve regurgitation: Trivial, regurgitant fraction 7% Left ventricle: LV female LV EF: 48 % (Normal 52-79%) Absolute volumes: LV EDV: (Normal 78-167 mL) LV ESV: 59mL (Normal 21-64 mL) LV SV: 54mL (Normal 52-114 mL) CO: 3.5L/min (Normal 2.7-6.3 L/min) Indexed volumes: LV EDV: 75mL/sq-m (normal 50-96 mL/sq-m) LV ESV: 30mL/sq-m (  normal 10-40 mL/sq-m) LV SV: 69mL/sq-m (Normal 33-64 mL/sq-m) CI: 2.15L/min/sq-m (Normal 1.9-3.9 L/min/sq-m) Right ventricle: RV female RV EF: 59% (normal 52-80%) Absolute volumes: RV EDV: 95mL (normal 79-175 mL) RV ESV: 39mL (Normal 13-75 mL) RV SV: 56mL (Normal 56-110 mL) CO: 3.6L/min (Normal 2.7-6 L/min) Indexed volumes: RV EDV: 16mL/sq-m (Normal 51-97 mL/sq-m) RV ESV: 44mL/sq-m (Normal 9-42 mL/sq-m) RV SV: 70mL/sq-m (Normal 35-61 mL/sq-m) CI: 2.21L/min/sq-m (Normal 1.8-3.8 L/min/sq-m) IMPRESSION: 1. Left ventricular apical pouch approximately 2 cm in diameter with no apparent thrombus. Apical pouch is akinetic with delayed myocardial enhancement suggesting scar. Apical  myocardial wall thickness is moderately increased at 10 mm, and when indexed to BSA may meet criteria for apical variant hypertrophic cardiomyopathy. No delayed myocardial enhancement in the myocardium. 2. Normal left ventricular chamber size with mildly reduced LV systolic function, LVEF 48%. 3. Normal right ventricular chamber size and systolic function, RVEF 59%. 4. Moderate aortic valve regurgitation, appears central, tricuspid aortic valve. Regurgitant fraction 19%. With no definite LGE in the apical myocardium, findings of apical pouch would seem most consistent with apical HCM. Electronically Signed   By: Weston Brass M.D.   On: 03/04/2023 09:35   MR CARDIAC VELOCITY FLOW MAP Result Date: 03/04/2023 CLINICAL DATA:  Chest pain/anginal equiv, ECGs and troponins normal Prior descending thoracic aorta repair. COMPARISON: Echo 09/19/22 EXAM: MR CARDIA MORPHOLOGY WITHOUT AND WITH CONTRAST; MR CARDIAC VELOCITY FLOW MAPPING TECHNIQUE: The patient was scanned on a 1.5 Tesla Siemens magnet. A dedicated cardiac coil was used. Functional imaging was done using TrueFisp sequences. 2,3, and 4 chamber views were done to assess for RWMA's. Modified Simpson's rule using a short axis stack was used to calculate an ejection fraction on a dedicated work [employer], [employer]. The patient received 9mL GADAVIST GADOBUTROL 1 MMOL/ML IV SOLN. After 10 minutes inversion recovery sequences were used to assess for infiltration and scar tissue. Phase contrast velocity encoded images obtained x 2. This examination is tailored for evaluation cardiac anatomy and function and provides very limited assessment of noncardiac structures, which are accordingly not evaluated during interpretation. If there is clinical concern for extracardiac pathology, further evaluation with CT imaging should be considered. FINDINGS: LEFT VENTRICLE: Normal left ventricular chamber size. Mild-moderately increased left ventricular wall thickness.  Maximal wall thickness at base is 10 mm, and at apex is approximately 10 mm. When apical wall thickness is indexed to BSA, indexed thickness is 10 mm/1.645 m2 = 6 and may meet criteria for apical HCM. Mildly reduced left ventricular systolic function. LVEF = 48% There is an LV apical aneurysm with akinesis.  No apparent thrombus. Borderline normal values for myocardial edema, T2 = 55 msec, apical T2 values are slightly higher than base and mid. Normal first pass perfusion. There is post contrast delayed myocardial enhancement: LGE in the apical aneurysm, otherwise no LGE. Normal T1 myocardial nulling kinetics suggest against a diagnosis of cardiac amyloidosis. ECV = 26% measured in area of normal myocardium. RIGHT VENTRICLE: Normal right ventricular chamber size. Normal right ventricular wall thickness. Normal right ventricular systolic function. RVEF = 59% There are no regional wall motion abnormalities. No post contrast delayed myocardial enhancement. ATRIA: Mild biatrial dilation. VALVES: Tricuspid aortic valve with central AI. PERICARDIUM: Normal pericardium.  No pericardial effusion. AORTA: Ascending aorta measures 37 mm. Descending aorta appears grossly normal however angiography not performed. OTHER: No significant extracardiac findings. MEASUREMENTS: Qp/Qs: 1.1 Aortic valve regurgitation: Moderate, regurgitant fraction 19% Pulmonary valve regurgitation: Trivial, regurgitant fraction 2%, visually mild Mitral valve regurgitation:  None, regurgitant fraction <1% Tricuspid valve regurgitation: Trivial, regurgitant fraction 7% Left ventricle: LV female LV EF: 48 % (Normal 52-79%) Absolute volumes: LV EDV: (Normal 78-167 mL) LV ESV: 59mL (Normal 21-64 mL) LV SV: 54mL (Normal 52-114 mL) CO: 3.5L/min (Normal 2.7-6.3 L/min) Indexed volumes: LV EDV: 47mL/sq-m (normal 50-96 mL/sq-m) LV ESV: 25mL/sq-m (normal 10-40 mL/sq-m) LV SV: 50mL/sq-m (Normal 33-64 mL/sq-m) CI: 2.15L/min/sq-m (Normal 1.9-3.9 L/min/sq-m) Right  ventricle: RV female RV EF: 59% (normal 52-80%) Absolute volumes: RV EDV: 95mL (normal 79-175 mL) RV ESV: 39mL (Normal 13-75 mL) RV SV: 56mL (Normal 56-110 mL) CO: 3.6L/min (Normal 2.7-6 L/min) Indexed volumes: RV EDV: 52mL/sq-m (Normal 51-97 mL/sq-m) RV ESV: 73mL/sq-m (Normal 9-42 mL/sq-m) RV SV: 46mL/sq-m (Normal 35-61 mL/sq-m) CI: 2.21L/min/sq-m (Normal 1.8-3.8 L/min/sq-m) IMPRESSION: 1. Left ventricular apical pouch approximately 2 cm in diameter with no apparent thrombus. Apical pouch is akinetic with delayed myocardial enhancement suggesting scar. Apical myocardial wall thickness is moderately increased at 10 mm, and when indexed to BSA may meet criteria for apical variant hypertrophic cardiomyopathy. No delayed myocardial enhancement in the myocardium. 2. Normal left ventricular chamber size with mildly reduced LV systolic function, LVEF 48%. 3. Normal right ventricular chamber size and systolic function, RVEF 59%. 4. Moderate aortic valve regurgitation, appears central, tricuspid aortic valve. Regurgitant fraction 19%. With no definite LGE in the apical myocardium, findings of apical pouch would seem most consistent with apical HCM. Electronically Signed   By: Weston Brass M.D.   On: 03/04/2023 09:35   MR CARDIAC VELOCITY FLOW MAP Result Date: 03/04/2023 CLINICAL DATA:  Chest pain/anginal equiv, ECGs and troponins normal Prior descending thoracic aorta repair. COMPARISON: Echo 09/19/22 EXAM: MR CARDIA MORPHOLOGY WITHOUT AND WITH CONTRAST; MR CARDIAC VELOCITY FLOW MAPPING TECHNIQUE: The patient was scanned on a 1.5 Tesla Siemens magnet. A dedicated cardiac coil was used. Functional imaging was done using TrueFisp sequences. 2,3, and 4 chamber views were done to assess for RWMA's. Modified Simpson's rule using a short axis stack was used to calculate an ejection fraction on a dedicated work [employer], [employer]. The patient received 9mL GADAVIST GADOBUTROL 1 MMOL/ML IV SOLN. After 10 minutes  inversion recovery sequences were used to assess for infiltration and scar tissue. Phase contrast velocity encoded images obtained x 2. This examination is tailored for evaluation cardiac anatomy and function and provides very limited assessment of noncardiac structures, which are accordingly not evaluated during interpretation. If there is clinical concern for extracardiac pathology, further evaluation with CT imaging should be considered. FINDINGS: LEFT VENTRICLE: Normal left ventricular chamber size. Mild-moderately increased left ventricular wall thickness. Maximal wall thickness at base is 10 mm, and at apex is approximately 10 mm. When apical wall thickness is indexed to BSA, indexed thickness is 10 mm/1.645 m2 = 6 and may meet criteria for apical HCM. Mildly reduced left ventricular systolic function. LVEF = 48% There is an LV apical aneurysm with akinesis.  No apparent thrombus. Borderline normal values for myocardial edema, T2 = 55 msec, apical T2 values are slightly higher than base and mid. Normal first pass perfusion. There is post contrast delayed myocardial enhancement: LGE in the apical aneurysm, otherwise no LGE. Normal T1 myocardial nulling kinetics suggest against a diagnosis of cardiac amyloidosis. ECV = 26% measured in area of normal myocardium. RIGHT VENTRICLE: Normal right ventricular chamber size. Normal right ventricular wall thickness. Normal right ventricular systolic function. RVEF = 59% There are no regional wall motion abnormalities. No post contrast delayed myocardial enhancement. ATRIA: Mild biatrial  dilation. VALVES: Tricuspid aortic valve with central AI. PERICARDIUM: Normal pericardium.  No pericardial effusion. AORTA: Ascending aorta measures 37 mm. Descending aorta appears grossly normal however angiography not performed. OTHER: No significant extracardiac findings. MEASUREMENTS: Qp/Qs: 1.1 Aortic valve regurgitation: Moderate, regurgitant fraction 19% Pulmonary valve  regurgitation: Trivial, regurgitant fraction 2%, visually mild Mitral valve regurgitation: None, regurgitant fraction <1% Tricuspid valve regurgitation: Trivial, regurgitant fraction 7% Left ventricle: LV female LV EF: 48 % (Normal 52-79%) Absolute volumes: LV EDV: (Normal 78-167 mL) LV ESV: 59mL (Normal 21-64 mL) LV SV: 54mL (Normal 52-114 mL) CO: 3.5L/min (Normal 2.7-6.3 L/min) Indexed volumes: LV EDV: 33mL/sq-m (normal 50-96 mL/sq-m) LV ESV: 31mL/sq-m (normal 10-40 mL/sq-m) LV SV: 55mL/sq-m (Normal 33-64 mL/sq-m) CI: 2.15L/min/sq-m (Normal 1.9-3.9 L/min/sq-m) Right ventricle: RV female RV EF: 59% (normal 52-80%) Absolute volumes: RV EDV: 95mL (normal 79-175 mL) RV ESV: 39mL (Normal 13-75 mL) RV SV: 56mL (Normal 56-110 mL) CO: 3.6L/min (Normal 2.7-6 L/min) Indexed volumes: RV EDV: 32mL/sq-m (Normal 51-97 mL/sq-m) RV ESV: 85mL/sq-m (Normal 9-42 mL/sq-m) RV SV: 6mL/sq-m (Normal 35-61 mL/sq-m) CI: 2.21L/min/sq-m (Normal 1.8-3.8 L/min/sq-m) IMPRESSION: 1. Left ventricular apical pouch approximately 2 cm in diameter with no apparent thrombus. Apical pouch is akinetic with delayed myocardial enhancement suggesting scar. Apical myocardial wall thickness is moderately increased at 10 mm, and when indexed to BSA may meet criteria for apical variant hypertrophic cardiomyopathy. No delayed myocardial enhancement in the myocardium. 2. Normal left ventricular chamber size with mildly reduced LV systolic function, LVEF 48%. 3. Normal right ventricular chamber size and systolic function, RVEF 59%. 4. Moderate aortic valve regurgitation, appears central, tricuspid aortic valve. Regurgitant fraction 19%. With no definite LGE in the apical myocardium, findings of apical pouch would seem most consistent with apical HCM. Electronically Signed   By: Weston Brass M.D.   On: 03/04/2023 09:35   NM PET CT CARDIAC PERFUSION MULTI W/ABSOLUTE BLOODFLOW Result Date: 03/03/2023   Medium size, moderate, fixed perfusion defect of  the anterior wall mid to apical segments that is slightly worse on stress consistent with infarct and peri-infarct ischemia. Small, severe, fixed defect of the apex consistent with infarction. Patient noted to have apical aneurysm and perfusion imaging concerning for LAD disease. MBFR noted to be normal in this region, but this can occur with small areas of ischemia. Apical MBFR is severely abnormal. Mildly reduced LVEF with increase at stress, no TID. Findings are concerning for LAD infarction/peri-infarct ischemia.   LV perfusion is abnormal. There is evidence of ischemia. There is evidence of infarction. Defect 1: There is a medium defect with moderate reduction in uptake present in the apical to mid anterior location(s) that is partially reversible. There is abnormal wall motion in the defect area. Consistent with infarction and peri-infarct ischemia. Defect 2: There is a small defect with severe reduction in uptake present in the apical apex location(s) that is fixed. There is abnormal wall motion in the defect area. Consistent with infarction.   Rest left ventricular function is abnormal. Rest global function is mildly reduced. Rest EF: 43%. Stress left ventricular function is abnormal. Stress global function is mildly reduced. Stress EF: 49%. End diastolic cavity size is normal.   Myocardial blood flow was computed to be 0.34ml/g/min at rest and 1.46ml/g/min at stress. Global myocardial blood flow reserve was 2.19 and was normal.   Coronary calcium was present on the attenuation correction CT images. Severe coronary calcifications were present. Coronary calcifications were present in the left anterior descending artery, left circumflex artery and  right coronary artery distribution(s).   Findings are consistent with infarction with peri-infarct ischemia. The study is high risk due to anterior ischemia/infarction. Electronically signed by Lennie Odor, MD  ___________________________________________________________________________ ________________________________________ CLINICAL DATA:  This over-read does not include interpretation of cardiac or coronary anatomy or pathology. The Cardiac PET CT interpretation by the cardiologist is attached. COMPARISON:  None Available. FINDINGS: Cardiovascular: Aortic atherosclerosis. Cardiomegaly extensive three-vessel coronary artery calcifications. No pericardial effusion. Limited Mediastinum/Nodes: Multiple benign, densely calcified mediastinal and bilateral hilar lymph nodes. Trachea and esophagus demonstrate no significant findings. Limited Lungs/Pleura: Bibasilar scarring or atelectasis. No pleural effusion or pneumothorax. Upper Abdomen: No acute abnormality. Musculoskeletal: No chest wall abnormality. No acute osseous findings. IMPRESSION: 1. No acute CT findings of the included chest. 2. Cardiomegaly and extensive three-vessel coronary artery calcifications. 3. Multiple benign, densely calcified mediastinal and bilateral hilar lymph nodes, consistent with prior granulomatous disease. Aortic Atherosclerosis (ICD10-I70.0). Electronically Signed   By: Jearld Lesch M.D.   On: 03/03/2023 13:23  Disposition   Pt is being discharged home today in good condition.  Follow-up Plans & Appointments     Discharge Instructions     Amb Referral to Cardiac Rehabilitation   Complete by: As directed    Diagnosis: Coronary Stents   After initial evaluation and assessments completed: Virtual Based Care may be provided alone or in conjunction with Phase 2 Cardiac Rehab based on patient barriers.: Yes   Intensive Cardiac Rehabilitation (ICR) [facility] location only OR Traditional Cardiac Rehabilitation (TCR) *If criteria for ICR are not met will enroll in TCR [facility] only): Yes   Call MD for:  difficulty breathing, headache or visual disturbances   Complete by: As directed    Call MD for:  persistant dizziness or light-headedness    Complete by: As directed    Call MD for:  redness, tenderness, or signs of infection (pain, swelling, redness, odor or green/yellow discharge around incision site)   Complete by: As directed    Diet - low sodium heart healthy   Complete by: As directed    Discharge instructions   Complete by: As directed    Groin Site Care Refer to this sheet in the next few weeks. These instructions provide you with information on caring for yourself after your procedure. Your caregiver may also give you more specific instructions. Your treatment has been planned according to current medical practices, but problems sometimes occur. Call your caregiver if you have any problems or questions after your procedure. HOME CARE INSTRUCTIONS You may shower 24 hours after the procedure. Remove the bandage (dressing) and gently wash the site with plain soap and water. Gently pat the site dry.  Do not apply powder or lotion to the site.  Do not sit in a bathtub, swimming pool, or whirlpool for 5 to 7 days.  No bending, squatting, or lifting anything over 10 pounds (4.5 kg) as directed by your caregiver.  Inspect the site at least twice daily.  Do not drive home if you are discharged the same day of the procedure. Have someone else drive you.  You may drive 24 hours after the procedure unless otherwise instructed by your caregiver.  What to expect: Any bruising will usually fade within 1 to 2 weeks.  Blood that collects in the tissue (hematoma) may be painful to the touch. It should usually decrease in size and tenderness within 1 to 2 weeks.  SEEK IMMEDIATE MEDICAL CARE IF: You have unusual pain at the groin site or down the  affected leg.  You have redness, warmth, swelling, or pain at the groin site.  You have drainage (other than a small amount of blood on the dressing).  You have chills.  You have a fever or persistent symptoms for more than 72 hours.  You have a fever and your symptoms suddenly get worse.  Your  leg becomes pale, cool, tingly, or numb.  You have heavy bleeding from the site. Hold pressure on the site. Marland Kitchen  PLEASE DO NOT MISS ANY DOSES OF YOUR PLAVIX!!!!! Also keep a log of you blood pressures and bring back to your follow up appt. Please call the office with any questions.   Patients taking blood thinners should generally stay away from medicines like ibuprofen, Advil, Motrin, naproxen, and Aleve due to risk of stomach bleeding. You may take Tylenol as directed or talk to your primary doctor about alternatives.   PLEASE ENSURE THAT YOU DO NOT RUN OUT OF YOUR PLAVIX. This medication is very important to remain on for at least 6months. IF you have issues obtaining this medication due to cost please CALL the office 3-5 business days prior to running out in order to prevent missing doses of this medication.   Increase activity slowly   Complete by: As directed         Discharge Medications   Allergies as of 03/19/2023       Reactions   Erythromycin Hives   Penicillins Hives        Medication List     STOP taking these medications    Excedrin Extra Strength 250-250-65 MG tablet Generic drug: aspirin-acetaminophen-caffeine   hydrALAZINE 25 MG tablet Commonly known as: APRESOLINE   rosuvastatin 20 MG tablet Commonly known as: CRESTOR       TAKE these medications    amLODipine 5 MG tablet Commonly known as: NORVASC Take 1 tablet (5 mg total) by mouth daily.   aspirin EC 81 MG tablet Take 1 tablet (81 mg total) by mouth daily. Swallow whole. Start taking on: March 20, 2023   atorvastatin 80 MG tablet Commonly known as: Lipitor Take 1 tablet (80 mg total) by mouth daily.   bimatoprost 0.01 % Soln Commonly known as: LUMIGAN Place 1 drop into both eyes at bedtime.   BIOTIN PO Take 1 tablet by mouth daily.   budesonide-formoterol 80-4.5 MCG/ACT inhaler Commonly known as: SYMBICORT Inhale 2 puffs into the lungs daily as needed (for shortness of breath).    clopidogrel 75 MG tablet Commonly known as: PLAVIX Take 1 tablet (75 mg total) by mouth daily.   FISH OIL PO Take 1 capsule by mouth daily.   nitroGLYCERIN 0.4 MG SL tablet Commonly known as: Nitrostat Place 1 tablet (0.4 mg total) under the tongue every 5 (five) minutes as needed.   potassium chloride SA 20 MEQ tablet Commonly known as: Klor-Con M20 Take 1 tablet (20 mEq total) by mouth daily.   PREVAGEN PO Take 1 tablet by mouth daily.   spironolactone 100 MG tablet Commonly known as: ALDACTONE Take 0.5 tablets (50 mg total) by mouth daily.   Synthroid 50 MCG tablet Generic drug: levothyroxine Take 50 mcg by mouth every morning.   thiamine 100 MG tablet Commonly known as: VITAMIN B1 Take 1 tablet (100 mg total) by mouth daily.         Outstanding Labs/Studies   FLP/LFTs in 8 weeks  BMET at follow up appt  Duration of Discharge Encounter: APP Time: 25 minutes   Signed, Lillia Abed  Su Hilt, NP 03/19/2023, 3:45 PM   Agree with note by Laverda Page NP-C  Pt of Dr Fabio Bering who underwent diagnostic cath by Dr Sanjuana Kava revealing a focal prox/mid LAD stenosis who had staged PCI/DES by Dr [place] this AM with an excellent result. Done femorally secondary to right subclavian tortuosity. OK to DC home after sheath removed and pt remained supine for the designated time period. DAPT for 12 months. F/U with Dr Eden Emms.   Runell Gess, M.D., FACP, [facility], [facility] [address] [place], [place]  [address]  [phone] 03/19/2023 3:50 PM

## 2023-03-19 NOTE — Progress Notes (Signed)
CARDIAC REHAB PHASE I     Post stent education including site care, restrictions, risk factors, exercise guidelines, NTG use, antiplatelet therapy importance, heart healthy diet and CRP2 reviewed. All questions and concerns addressed. Will refer to AP for CRP2. Plan for home later today.    [id number] Woodroe Chen, RN BSN 03/19/2023 2:56 PM

## 2023-03-20 ENCOUNTER — Encounter (HOSPITAL_COMMUNITY): Payer: Self-pay | Admitting: Cardiology

## 2023-03-26 NOTE — Progress Notes (Signed)
Cardiology Office Note    Patient Name: Cheryl [name] Date of Encounter: 03/27/2023  Primary Care Provider:  [name] Grandchild, MD Primary Cardiologist:  Charlton Haws, MD Primary Electrophysiologist: None   Past Medical History    Past Medical History:  Diagnosis Date   AAA (abdominal aortic aneurysm) (HCC)    Arthritis    osteoarthritis   Hyperlipidemia    Hypertension    Hypoaldosteronism (HCC)    Renal disorder    renal insufficiency   Sarcoidosis    Thyroid disease    hypothyroidism    History of Present Illness  Cheryl [name] is a 73 y.o. female with a PMH of CAD s/p LHC with 90% stenosis mid LAD with DES x 1, HTN, HLD ascending thoracic aneurysm s/p repair 2012, AAA, sarcoidosis, hypoaldosteronism, hypothyroidism who presents for post PCI follow-up.  [name] is been followed by Dr. Eden Emms since 03/2022 for referral of aortic valve disease.  She has a history of descending thoracic aneurysm repair in 2012 by Dr. Laneta Simmers.  She completed a 2D echo on 08/2022 that showed EF of 45-50% with grade 1 DD and mildly dilated LA/RA, moderate AVR.  She underwent a cardiac MRI due to possible apical hypertrophic DCM that showed apical wall thickness of 10 mm moderate AR and apical hypertrophic DCM.  She underwent a PET stress test that showed questionable apical aneurysm with LAD infarct and peri-infarct ischemia with severe calcium involving all 3 arteries.  She underwent a LHC on 03/18/2023 that showed severe calcified stenosis in the mid LAD treated with staged PCI/DES x 1 following hydration, 20% stenosed first marginal lesion, and proximal RCA to distal RCA lesion of 20%.  They were unable to cannulate arteries from radial approach and instead used the femoral artery with a staged PCI and hydration.  She was placed on DAPT for 6 months with Plavix and ASA 81 mg.  She was also switched to atorvastatin 80 mg and hydralazine was discontinued and patient was started on amlodipine 5  mg.  [name] presents today for post PCI follow-up.  She presents with ongoing dizziness and fatigue, which have worsened since the stent placement. The dizziness is particularly noticeable upon standing and has been severe enough to cause the patient to need to sit down while out shopping. The fatigue is significant, with the patient reporting feeling "worn out" and needing to rest after activities. The patient also reports a lack of appetite and significant weight loss over the past six months.  During today's visit she reports not eating much and drinking very little.  We discussed the importance of staying hydrated and good p.o. intake and preventing dizziness.  The patient occasionally experiences palpitations, described as a prominent heartbeat that can be heard and a sensation of the heart "skipping a beat" for about a second, but these episodes are infrequent and do not cause distress. Patient denies chest pain, palpitations, dyspnea, PND, orthopnea, nausea, vomiting, dizziness, syncope, edema, weight gain, or early satiety.   Review of Systems  Please see the history of present illness.    All other systems reviewed and are otherwise negative except as noted above.  Physical Exam    Wt Readings from Last 3 Encounters:  03/27/23 123 lb 6.4 oz (56 kg)  03/18/23 121 lb 11.2 oz (55.2 kg)  03/11/23 126 lb (57.2 kg)   VS: Vitals:   03/27/23 1342  BP: (!) 120/54  Pulse: 66  SpO2: 99%  ,Body mass index is 21.18 kg/m.  GEN: Well nourished, well developed in no acute distress Neck: No JVD; No carotid bruits Pulmonary: Clear to auscultation without rales, wheezing or rhonchi  Cardiovascular: Normal rate. Regular right femoral artery access with ecchymosis and no evidence of hematoma present.  Rhythm.  Normal S1. Normal S2.   Murmurs: There is no murmur.  ABDOMEN: Soft, non-tender, non-distended EXTREMITIES:  No edema; No deformity   EKG/LABS/ Recent Cardiac Studies   ECG personally  reviewed by me today -completed today  Risk Assessment/Calculations:          Lab Results  Component Value Date   WBC 6.5 03/19/2023   HGB 9.6 (L) 03/19/2023   HCT 29.0 (L) 03/19/2023   MCV 92.1 03/19/2023   PLT 201 03/19/2023   Lab Results  Component Value Date   CREATININE 1.30 (H) 03/19/2023   BUN 19 03/19/2023   NA 135 03/19/2023   K 3.4 (L) 03/19/2023   CL 105 03/19/2023   CO2 21 (L) 03/19/2023   Lab Results  Component Value Date   CHOL 160 10/03/2022   HDL 56.70 10/03/2022   LDLCALC 84 10/03/2022   TRIG 95.0 10/03/2022   CHOLHDL 3 10/03/2022    No results found for: "HGBA1C" Assessment & Plan    1.  Coronary artery disease: -Patient underwent PET/CT that showed possible LAD infarct with near coronary calcium in all 3 arteries. -Outpatient LHC was ordered and patient found to have 90% mid LAD stenosis that was heavily calcified, 3% distal RCA, 3% first OM with staged PCI of the LAD following day. -Today patient reports no ongoing chest pain but does report some fatigue. -Encouraged increased hydration and regular meals to prevent dizziness. -Continue current medications including Norvasc 5 mg, Spironolactone 50 mg, Atorvastatin 80 mg, Plavix 75 mg, and Aspirin 81 mg.  2.  Essential hypertension: -Patient's blood pressure today was controlled at 120/54 -Continue Norvasc 5 mg daily, and spironolactone 50 mg daily  3.  History of descending thoracic aneurysm: -s/p repair by Dr. Laneta Simmers in 2012 -Currently followed by CT surgery  4.  Hyperlipidemia: -Patient's last LDL cholesterol was 84 -Continue atorvastatin 80 mg daily  5.  Hypoaldosteronism: -Patient currently on spironolactone 50 mg daily    Cardiac Rehabilitation Eligibility Assessment  The patient is ready to start cardiac rehabilitation from a cardiac standpoint.  Patient would like to complete cardiac rehab at [facility]  Disposition: Follow-up with Charlton Haws, MD or APP in 3 months      Signed, Napoleon Form, Leodis Rains, NP 03/27/2023, 5:33 PM [facility]

## 2023-03-27 ENCOUNTER — Encounter: Payer: Self-pay | Admitting: Nurse Practitioner

## 2023-03-27 ENCOUNTER — Ambulatory Visit: Payer: Medicare HMO | Attending: Nurse Practitioner | Admitting: Nurse Practitioner

## 2023-03-27 VITALS — BP 120/54 | HR 66 | Ht 64.0 in | Wt 123.4 lb

## 2023-03-27 DIAGNOSIS — I351 Nonrheumatic aortic (valve) insufficiency: Secondary | ICD-10-CM

## 2023-03-27 DIAGNOSIS — E785 Hyperlipidemia, unspecified: Secondary | ICD-10-CM

## 2023-03-27 DIAGNOSIS — I2510 Atherosclerotic heart disease of native coronary artery without angina pectoris: Secondary | ICD-10-CM | POA: Diagnosis not present

## 2023-03-27 DIAGNOSIS — E2740 Unspecified adrenocortical insufficiency: Secondary | ICD-10-CM

## 2023-03-27 DIAGNOSIS — I10 Essential (primary) hypertension: Secondary | ICD-10-CM | POA: Diagnosis not present

## 2023-03-27 NOTE — Progress Notes (Signed)
Patient would prefer cardiac rehab at [facility]

## 2023-03-27 NOTE — Patient Instructions (Addendum)
Medication Instructions:  Your physician recommends that you continue on your current medications as directed. Please refer to the Current Medication list given to you today.  *If you need a refill on your cardiac medications before your next appointment, please call your pharmacy*  Lab Work: LFTs and Lipids in 6 weeks  If you have labs (blood work) drawn today and your tests are completely normal, you will receive your results only by: MyChart Message (if you have MyChart) OR A paper copy in the mail If you have any lab test that is abnormal or we need to change your treatment, we will call you to review the results.  Testing/Procedures: None ordered.  Follow-Up:  Follow up with Dr [name] ASAP!  At [facility], you and your health needs are our priority.  As part of our continuing mission to provide you with exceptional heart care, we have created designated Provider Care Teams.  These Care Teams include your primary Cardiologist (physician) and Advanced Practice Providers (APPs -  Physician Assistants and Nurse Practitioners) who all work together to provide you with the care you need, when you need it.   Your next appointment:   3 month   The format for your next appointment:   In Person  Provider:   Robin Searing, NP

## 2023-03-30 ENCOUNTER — Encounter (HOSPITAL_COMMUNITY): Payer: Self-pay

## 2023-04-03 ENCOUNTER — Other Ambulatory Visit: Payer: Self-pay | Admitting: Internal Medicine

## 2023-04-03 DIAGNOSIS — E876 Hypokalemia: Secondary | ICD-10-CM

## 2023-04-03 DIAGNOSIS — E2740 Unspecified adrenocortical insufficiency: Secondary | ICD-10-CM

## 2023-04-06 ENCOUNTER — Encounter (HOSPITAL_COMMUNITY)
Admission: RE | Admit: 2023-04-06 | Discharge: 2023-04-06 | Disposition: A | Discharge status: Home / Self Care | Payer: Medicare HMO | Source: Ambulatory Visit | Attending: Cardiovascular Disease | Admitting: Cardiovascular Disease

## 2023-04-06 ENCOUNTER — Ambulatory Visit: Payer: Medicare HMO | Admitting: Internal Medicine

## 2023-04-06 VITALS — Ht 64.0 in | Wt 124.8 lb

## 2023-04-06 DIAGNOSIS — Z955 Presence of coronary angioplasty implant and graft: Secondary | ICD-10-CM | POA: Insufficient documentation

## 2023-04-06 NOTE — Patient Instructions (Signed)
 Patient Instructions  Patient Details  Name: Cheryl [name] MRN: [id number] Date of Birth: [birth year] Referring Provider:  Odie Benne*  Below are your personal goals for exercise, nutrition, and risk factors. Our goal is to help you stay on track towards obtaining and maintaining these goals. We will be discussing your progress on these goals with you throughout the program.  Initial Exercise Prescription:  Initial Exercise Prescription - 04/06/23 1400       Date of Initial Exercise RX and Referring Provider   Date 04/06/23    Referring Provider Antoinette Batman MD   Primary Cardiologist: Dr. Janelle Mediate     Oxygen   Maintain Oxygen Saturation 88% or higher      Treadmill   MPH 1.7    Grade 0.5    Minutes 15    METs 2.42      NuStep   Level 1    SPM 80    Minutes 15    METs 2      Prescription Details   Frequency (times per week) 2    Duration Progress to 30 minutes of continuous aerobic without signs/symptoms of physical distress      Intensity   THRR 40-80% of Max Heartrate 94-129    Ratings of Perceived Exertion 11-13    Perceived Dyspnea 0-4      Progression   Progression Continue to progress workloads to maintain intensity without signs/symptoms of physical distress.      Resistance Training   Training Prescription Yes    Weight 2 lb    Reps 10-15             Exercise Goals: Frequency: Be able to perform aerobic exercise two to three times per week in program working toward 2-5 days per week of home exercise.  Intensity: Work with a perceived exertion of 11 (fairly light) - 15 (hard) while following your exercise prescription.  We will make changes to your prescription with you as you progress through the program.   Duration: Be able to do 30 to 45 minutes of continuous aerobic exercise in addition to a 5 minute warm-up and a 5 minute cool-down routine.   Nutrition Goals: Your personal nutrition goals will be established when you  do your nutrition analysis with the dietician.  The following are general nutrition guidelines to follow: Cholesterol < 200mg /day Sodium < 1500mg /day Fiber: Women over 50 yrs - 21 grams per day  Personal Goals:  Personal Goals and Risk Factors at Admission - 04/06/23 1559       Core Components/Risk Factors/Patient Goals on Admission    Weight Management Yes;Weight Gain;Weight Maintenance    Intervention Weight Management: Develop a combined nutrition and exercise program designed to reach desired caloric intake, while maintaining appropriate intake of nutrient and fiber, sodium and fats, and appropriate energy expenditure required for the weight goal.;Weight Management: Provide education and appropriate resources to help participant work on and attain dietary goals.    Admit Weight 124 lb 12.8 oz (56.6 kg)    Goal Weight: Short Term 126 lb (57.2 kg)    Goal Weight: Long Term 126 lb (57.2 kg)    Expected Outcomes Short Term: Continue to assess and modify interventions until short term weight is achieved;Long Term: Adherence to nutrition and physical activity/exercise program aimed toward attainment of established weight goal;Weight Maintenance: Understanding of the daily nutrition guidelines, which includes 25-35% calories from fat, 7% or less cal from saturated fats, less than 200mg  cholesterol, less than 1.5gm  of sodium, & 5 or more servings of fruits and vegetables daily;Weight Gain: Understanding of general recommendations for a high calorie, high protein meal plan that promotes weight gain by distributing calorie intake throughout the day with the consumption for 4-5 meals, snacks, and/or supplements    Hypertension Yes    Intervention Provide education on lifestyle modifcations including regular physical activity/exercise, weight management, moderate sodium restriction and increased consumption of fresh fruit, vegetables, and low fat dairy, alcohol moderation, and smoking cessation.;Monitor  prescription use compliance.    Expected Outcomes Short Term: Continued assessment and intervention until BP is < 140/52mm HG in hypertensive participants. < 130/42mm HG in hypertensive participants with diabetes, heart failure or chronic kidney disease.;Long Term: Maintenance of blood pressure at goal levels.    Lipids Yes    Intervention Provide education and support for participant on nutrition & aerobic/resistive exercise along with prescribed medications to achieve LDL 70mg , HDL >40mg .    Expected Outcomes Short Term: Participant states understanding of desired cholesterol values and is compliant with medications prescribed. Participant is following exercise prescription and nutrition guidelines.;Long Term: Cholesterol controlled with medications as prescribed, with individualized exercise RX and with personalized nutrition plan. Value goals: LDL < 70mg , HDL > 40 mg.             Tobacco Use Initial Evaluation: Social History   Tobacco Use  Smoking Status Never   Passive exposure: Never  Smokeless Tobacco Not on file    Exercise Goals and Review:  Exercise Goals     Row Name 04/06/23 1459             Exercise Goals   Increase Physical Activity Yes       Intervention Provide advice, education, support and counseling about physical activity/exercise needs.;Develop an individualized exercise prescription for aerobic and resistive training based on initial evaluation findings, risk stratification, comorbidities and participant's personal goals.       Expected Outcomes Short Term: Attend rehab on a regular basis to increase amount of physical activity.;Long Term: Add in home exercise to make exercise part of routine and to increase amount of physical activity.;Long Term: Exercising regularly at least 3-5 days a week.       Increase Strength and Stamina Yes       Intervention Provide advice, education, support and counseling about physical activity/exercise needs.;Develop an  individualized exercise prescription for aerobic and resistive training based on initial evaluation findings, risk stratification, comorbidities and participant's personal goals.       Expected Outcomes Short Term: Increase workloads from initial exercise prescription for resistance, speed, and METs.;Short Term: Perform resistance training exercises routinely during rehab and add in resistance training at home;Long Term: Improve cardiorespiratory fitness, muscular endurance and strength as measured by increased METs and functional capacity ( )       Able to understand and use rate of perceived exertion (RPE) scale Yes       Intervention Provide education and explanation on how to use RPE scale       Expected Outcomes Short Term: Able to use RPE daily in rehab to express subjective intensity level;Long Term:  Able to use RPE to guide intensity level when exercising independently       Able to understand and use Dyspnea scale Yes       Intervention Provide education and explanation on how to use Dyspnea scale       Expected Outcomes Short Term: Able to use Dyspnea scale daily in rehab to express subjective  sense of shortness of breath during exertion;Long Term: Able to use Dyspnea scale to guide intensity level when exercising independently       Knowledge and understanding of Target Heart Rate Range (THRR) Yes       Intervention Provide education and explanation of THRR including how the numbers were predicted and where they are located for reference       Expected Outcomes Short Term: Able to state/look up THRR;Long Term: Able to use THRR to govern intensity when exercising independently;Short Term: Able to use daily as guideline for intensity in rehab       Able to check pulse independently Yes       Intervention Provide education and demonstration on how to check pulse in carotid and radial arteries.;Review the importance of being able to check your own pulse for safety during independent exercise        Expected Outcomes Short Term: Able to explain why pulse checking is important during independent exercise;Long Term: Able to check pulse independently and accurately       Understanding of Exercise Prescription Yes       Intervention Provide education, explanation, and written materials on patient's individual exercise prescription       Expected Outcomes Short Term: Able to explain program exercise prescription;Long Term: Able to explain home exercise prescription to exercise independently                Copy of goals given to participant.

## 2023-04-06 NOTE — Progress Notes (Signed)
Cardiac Individual Treatment Plan  Patient Details  Name: Cheryl [name] MRN: [id number] Date of Birth: 1950-06-22 Referring Provider:   Flowsheet Row CARDIAC REHAB PHASE II ORIENTATION from 04/06/2023 in [facility] CARDIAC REHABILITATION  Referring Provider Verne Carrow MD  [Primary Cardiologist: Dr. Theron Arista Nishan]       Initial Encounter Date:  Flowsheet Row CARDIAC REHAB PHASE II ORIENTATION from 04/06/2023 in [place] CARDIAC REHABILITATION  Date 04/06/23       Visit Diagnosis: Status post coronary artery stent placement  Patient's Home Medications on Admission:  Current Outpatient Medications:    alendronate (FOSAMAX) 70 MG tablet, Take 70 mg by mouth once a week. Take with a full glass of water on an empty stomach., Disp: , Rfl:    amLODipine (NORVASC) 5 MG tablet, Take 1 tablet (5 mg total) by mouth daily., Disp: 30 tablet, Rfl: 11   Apoaequorin (PREVAGEN PO), Take 1 tablet by mouth daily., Disp: , Rfl:    aspirin EC 81 MG tablet, Take 1 tablet (81 mg total) by mouth daily. Swallow whole., Disp: 90 tablet, Rfl: 2   atorvastatin (LIPITOR) 80 MG tablet, Take 1 tablet (80 mg total) by mouth daily., Disp: 30 tablet, Rfl: 11   bimatoprost (LUMIGAN) 0.01 % SOLN, Place 1 drop into both eyes at bedtime., Disp: , Rfl:    BIOTIN PO, Take 1 tablet by mouth daily., Disp: , Rfl:    budesonide-formoterol (SYMBICORT) 80-4.5 MCG/ACT inhaler, Inhale 2 puffs into the lungs daily as needed (for shortness of breath)., Disp: , Rfl:    clopidogrel (PLAVIX) 75 MG tablet, Take 1 tablet (75 mg total) by mouth daily., Disp: 90 tablet, Rfl: 2   nitroGLYCERIN (NITROSTAT) 0.4 MG SL tablet, Place 1 tablet (0.4 mg total) under the tongue every 5 (five) minutes as needed., Disp: 25 tablet, Rfl: 2   Omega-3 Fatty Acids (FISH OIL PO), Take 1 capsule by mouth daily., Disp: , Rfl:    potassium chloride SA (KLOR-CON M20) 20 MEQ tablet, Take 1 tablet (20 mEq total) by mouth daily., Disp: 90 tablet, Rfl:  1   spironolactone (ALDACTONE) 100 MG tablet, Take 0.5 tablets (50 mg total) by mouth daily., Disp: , Rfl:    SYNTHROID 50 MCG tablet, Take 50 mcg by mouth every morning., Disp: , Rfl:    thiamine (VITAMIN B1) 100 MG tablet, Take 1 tablet (100 mg total) by mouth daily., Disp: 90 tablet, Rfl: 1  Past Medical History: Past Medical History:  Diagnosis Date   AAA (abdominal aortic aneurysm) (HCC)    Arthritis    osteoarthritis   Hyperlipidemia    Hypertension    Hypoaldosteronism (HCC)    Renal disorder    renal insufficiency   Sarcoidosis    Thyroid disease    hypothyroidism    Tobacco Use: Social History   Tobacco Use  Smoking Status Never   Passive exposure: Never  Smokeless Tobacco Not on file    Labs: Review Flowsheet       Latest Ref Rng & Units 11/26/2020 01/02/2022 10/03/2022  Labs for ITP Cardiac and Pulmonary Rehab  Cholestrol 0 - 200 mg/dL 401     027  253   LDL (calc) 0 - 99 mg/dL 71     54  84   HDL-C >66.44 mg/dL 54     03.47  42.59   Trlycerides 0.0 - 149.0 mg/dL 74     56.3  87.5     Details       This  result is from an external source.         Capillary Blood Glucose: Lab Results  Component Value Date   GLUCAP 136 (H) 03/18/2023   GLUCAP 73 03/18/2023   GLUCAP 89 03/18/2023     Exercise Target Goals: Exercise Program Goal: Individual exercise prescription set using results from initial 6 min walk test and THRR while considering  patient's activity barriers and safety.   Exercise Prescription Goal: Starting with aerobic activity 30 plus minutes a day, 3 days per week for initial exercise prescription. Provide home exercise prescription and guidelines that participant acknowledges understanding prior to discharge.  Activity Barriers & Risk Stratification:  Activity Barriers & Cardiac Risk Stratification - 04/06/23 1318       Activity Barriers & Cardiac Risk Stratification   Activity Barriers Arthritis;Back Problems;Neck/Spine  Problems;Balance Concerns;History of Falls;Deconditioning;Muscular Weakness   on going back pain, dizzy spells since stent   Cardiac Risk Stratification Moderate             6 Minute Walk:  6 Minute Walk     Row Name 04/06/23 1457         6 Minute Walk   Phase Initial     Distance 1050 feet     Walk Time 6 minutes     # of Rest Breaks 0     MPH 1.99     METS 2.54     RPE 13     Perceived Dyspnea  1     VO2 Peak 8.88     Symptoms Yes (comment)     Comments R knee pain 3/10, SOB     Resting HR 59 bpm     Resting BP 112/58     Resting Oxygen Saturation  99 %     Exercise Oxygen Saturation  during 6 min walk 99 %     Max Ex. HR 105 bpm     Max Ex. BP 128/74     2 Minute Post BP 108/56              Oxygen Initial Assessment:   Oxygen Re-Evaluation:   Oxygen Discharge (Final Oxygen Re-Evaluation):   Initial Exercise Prescription:  Initial Exercise Prescription - 04/06/23 1400       Date of Initial Exercise RX and Referring Provider   Date 04/06/23    Referring Provider Verne Carrow MD   Primary Cardiologist: Dr. Charlton Haws     Oxygen   Maintain Oxygen Saturation 88% or higher      Treadmill   MPH 1.7    Grade 0.5    Minutes 15    METs 2.42      NuStep   Level 1    SPM 80    Minutes 15    METs 2      Prescription Details   Frequency (times per week) 2    Duration Progress to 30 minutes of continuous aerobic without signs/symptoms of physical distress      Intensity   THRR 40-80% of Max Heartrate 94-129    Ratings of Perceived Exertion 11-13    Perceived Dyspnea 0-4      Progression   Progression Continue to progress workloads to maintain intensity without signs/symptoms of physical distress.      Resistance Training   Training Prescription Yes    Weight 2 lb    Reps 10-15             Perform Capillary Blood Glucose checks as needed.  Exercise Prescription Changes:   Exercise Prescription Changes     Row Name  04/06/23 1400             Response to Exercise   Blood Pressure (Admit) 112/58       Blood Pressure (Exercise) 128/74       Blood Pressure (Exit) 108/56       Heart Rate (Admit) 59 bpm       Heart Rate (Exercise) 105 bpm       Heart Rate (Exit) 62 bpm       Oxygen Saturation (Admit) 99 %       Oxygen Saturation (Exercise) 99 %       Rating of Perceived Exertion (Exercise) 13       Perceived Dyspnea (Exercise) 1       Symptoms R knee pain 3/10, SOB       Comments walk test results                Exercise Comments:   Exercise Goals and Review:   Exercise Goals     Row Name 04/06/23 1459             Exercise Goals   Increase Physical Activity Yes       Intervention Provide advice, education, support and counseling about physical activity/exercise needs.;Develop an individualized exercise prescription for aerobic and resistive training based on initial evaluation findings, risk stratification, comorbidities and participant's personal goals.       Expected Outcomes Short Term: Attend rehab on a regular basis to increase amount of physical activity.;Long Term: Add in home exercise to make exercise part of routine and to increase amount of physical activity.;Long Term: Exercising regularly at least 3-5 days a week.       Increase Strength and Stamina Yes       Intervention Provide advice, education, support and counseling about physical activity/exercise needs.;Develop an individualized exercise prescription for aerobic and resistive training based on initial evaluation findings, risk stratification, comorbidities and participant's personal goals.       Expected Outcomes Short Term: Increase workloads from initial exercise prescription for resistance, speed, and METs.;Short Term: Perform resistance training exercises routinely during rehab and add in resistance training at home;Long Term: Improve cardiorespiratory fitness, muscular endurance and strength as measured by increased  METs and functional capacity ( )       Able to understand and use rate of perceived exertion (RPE) scale Yes       Intervention Provide education and explanation on how to use RPE scale       Expected Outcomes Short Term: Able to use RPE daily in rehab to express subjective intensity level;Long Term:  Able to use RPE to guide intensity level when exercising independently       Able to understand and use Dyspnea scale Yes       Intervention Provide education and explanation on how to use Dyspnea scale       Expected Outcomes Short Term: Able to use Dyspnea scale daily in rehab to express subjective sense of shortness of breath during exertion;Long Term: Able to use Dyspnea scale to guide intensity level when exercising independently       Knowledge and understanding of Target Heart Rate Range (THRR) Yes       Intervention Provide education and explanation of THRR including how the numbers were predicted and where they are located for reference       Expected Outcomes Short Term: Able to state/look up  THRR;Long Term: Able to use THRR to govern intensity when exercising independently;Short Term: Able to use daily as guideline for intensity in rehab       Able to check pulse independently Yes       Intervention Provide education and demonstration on how to check pulse in carotid and radial arteries.;Review the importance of being able to check your own pulse for safety during independent exercise       Expected Outcomes Short Term: Able to explain why pulse checking is important during independent exercise;Long Term: Able to check pulse independently and accurately       Understanding of Exercise Prescription Yes       Intervention Provide education, explanation, and written materials on patient's individual exercise prescription       Expected Outcomes Short Term: Able to explain program exercise prescription;Long Term: Able to explain home exercise prescription to exercise independently                 Exercise Goals Re-Evaluation :    Discharge Exercise Prescription (Final Exercise Prescription Changes):  Exercise Prescription Changes - 04/06/23 1400       Response to Exercise   Blood Pressure (Admit) 112/58    Blood Pressure (Exercise) 128/74    Blood Pressure (Exit) 108/56    Heart Rate (Admit) 59 bpm    Heart Rate (Exercise) 105 bpm    Heart Rate (Exit) 62 bpm    Oxygen Saturation (Admit) 99 %    Oxygen Saturation (Exercise) 99 %    Rating of Perceived Exertion (Exercise) 13    Perceived Dyspnea (Exercise) 1    Symptoms R knee pain 3/10, SOB    Comments walk test results             Nutrition:  Target Goals: Understanding of nutrition guidelines, daily intake of sodium 1500mg , cholesterol 200mg , calories 30% from fat and 7% or less from saturated fats, daily to have 5 or more servings of fruits and vegetables.  Biometrics:  Pre Biometrics - 04/06/23 1500       Pre Biometrics   Height 5\' 4"  (1.626 m)    Weight 56.6 kg    Waist Circumference 28 inches    Hip Circumference 34 inches    Waist to Hip Ratio 0.82 %    BMI (Calculated) 21.41    Grip Strength 21.5 kg    Single Leg Stand 7.8 seconds              Nutrition Therapy Plan and Nutrition Goals:  Nutrition Therapy & Goals - 04/06/23 1326       Intervention Plan   Intervention Prescribe, educate and counsel regarding individualized specific dietary modifications aiming towards targeted core components such as weight, hypertension, lipid management, diabetes, heart failure and other comorbidities.;Nutrition handout(s) given to patient.    Expected Outcomes Short Term Goal: Understand basic principles of dietary content, such as calories, fat, sodium, cholesterol and nutrients.;Long Term Goal: Adherence to prescribed nutrition plan.             Nutrition Assessments:  MEDIFICTS Score Key: >=70 Need to make dietary changes  40-70 Heart Healthy Diet <= 40 Therapeutic Level  Cholesterol Diet   Picture Your Plate Scores: <16 Unhealthy dietary pattern with much room for improvement. 41-50 Dietary pattern unlikely to meet recommendations for good health and room for improvement. 51-60 More healthful dietary pattern, with some room for improvement.  >60 Healthy dietary pattern, although there may be some specific behaviors that  could be improved.    Nutrition Goals Re-Evaluation:   Nutrition Goals Discharge (Final Nutrition Goals Re-Evaluation):   Psychosocial: Target Goals: Acknowledge presence or absence of significant depression and/or stress, maximize coping skills, provide positive support system. Participant is able to verbalize types and ability to use techniques and skills needed for reducing stress and depression.  Initial Review & Psychosocial Screening:  Initial Psych Review & Screening - 04/06/23 1324       Initial Review   Current issues with Current Stress Concerns;Current Depression    Source of Stress Concerns Unable to participate in former interests or hobbies;Unable to perform yard/household activities;Chronic Illness    Comments no appetite and not eating thus has loss of energy, not getting enough protein, dizziness and fatigue causing her not to be able to do things      Family Dynamics   Good Support System? Yes   husband, sisters, has son as well she can call on     Barriers   Psychosocial barriers to participate in program Psychosocial barriers identified (see note);The patient should benefit from training in stress management and relaxation.      Screening Interventions   Interventions Encouraged to exercise;To provide support and resources with identified psychosocial needs;Provide feedback about the scores to participant    Expected Outcomes Short Term goal: Utilizing psychosocial counselor, staff and physician to assist with identification of specific Stressors or current issues interfering with healing process. Setting  desired goal for each stressor or current issue identified.;Long Term Goal: Stressors or current issues are controlled or eliminated.;Short Term goal: Identification and review with participant of any Quality of Life or Depression concerns found by scoring the questionnaire.;Long Term goal: The participant improves quality of Life and PHQ9 Scores as seen by post scores and/or verbalization of changes             Quality of Life Scores:  Scores of 19 and below usually indicate a poorer quality of life in these areas.  A difference of  2-3 points is a clinically meaningful difference.  A difference of 2-3 points in the total score of the Quality of Life Index has been associated with significant improvement in overall quality of life, self-image, physical symptoms, and general health in studies assessing change in quality of life.  PHQ-9: Review Flowsheet  More data exists      04/06/2023 10/10/2022 10/03/2022 07/31/2022 07/29/2021  Depression screen PHQ 2/9  Decreased Interest 1 1 1  0 0 0  Down, Depressed, Hopeless 1 0 0 0 0 0  PHQ - 2 Score 2 1 1  0 0 0  Altered sleeping 0 - - 0 -  Tired, decreased energy 3 - - 0 -  Change in appetite 3 - - 0 -  Feeling bad or failure about yourself  0 - - 0 -  Trouble concentrating 0 - - 0 -  Moving slowly or fidgety/restless 0 - - 0 -  Suicidal thoughts 0 - - 0 -  PHQ-9 Score 8 - - 0 -  Difficult doing work/chores Very difficult - - - -    Details       Multiple values from one day are sorted in reverse-chronological order        Interpretation of Total Score  Total Score Depression Severity:  1-4 = Minimal depression, 5-9 = Mild depression, 10-14 = Moderate depression, 15-19 = Moderately severe depression, 20-27 = Severe depression   Psychosocial Evaluation and Intervention:  Psychosocial Evaluation - 04/06/23 1500  Psychosocial Evaluation & Interventions   Interventions Stress management education;Encouraged to exercise with the  program and follow exercise prescription    Comments Marelin is coming into cardiac rehab after a stent to her LAD.  Since her stent she has been having several dizzy spells and dealing with extreme fatigue which have prevented her from being able to do everything that she wants to do.  She is eager to feel better.  She is concerned that she will not be able to do all of the exercise.  She was pleased with how well she did today and not needing to stop during her walk test.  She was assured that she could take rest breaks as needed and that we would only compare her to herself and not others in her class.  She does also have a history of sarcoidosis and renal disease.  She wants to be able to go and do as she pleases and not get tired in the grocery store. We talked about how exercise can help her improve her strength and stamina.  Her PHQ did note some depression and she admitted to having some down days, but felt that it does not last, but last few weeks have definitely be tough on her.  She usually sleeps well.  She has a good support syster in her husband and sister and will call her son when needed.  She also attends church every Sunday.  Her doctors feel that her dizziness and fatigue may be coming from her loss of appetite as she is not eating and has lost weight.  She was encouraged to make sure she is eating healthy and eating enough protein to maintain her weight and normal activities.  She was open to continuing to work on her diet and trying to eat more.    Expected Outcomes Short: Attend rehab to build up stamina Long: Be able to go shopping like she enjoys    Continue Psychosocial Services  Follow up required by staff             Psychosocial Re-Evaluation:   Psychosocial Discharge (Final Psychosocial Re-Evaluation):   Vocational Rehabilitation: Provide vocational rehab assistance to qualifying candidates.   Vocational Rehab Evaluation & Intervention:  Vocational Rehab - 04/06/23  1319       Initial Vocational Rehab Evaluation & Intervention   Assessment shows need for Vocational Rehabilitation No   retired            Education: Education Goals: Education classes will be provided on a weekly basis, covering required topics. Participant will state understanding/return demonstration of topics presented.  Learning Barriers/Preferences:  Learning Barriers/Preferences - 04/06/23 1327       Learning Barriers/Preferences   Learning Barriers Sight   glasses for reading   Learning Preferences Skilled Demonstration;Written Material             Education Topics: Hypertension, Hypertension Reduction -Define heart disease and high blood pressure. Discus how high blood pressure affects the body and ways to reduce high blood pressure.   Exercise and Your Heart -Discuss why it is important to exercise, the FITT principles of exercise, normal and abnormal responses to exercise, and how to exercise safely.   Angina -Discuss definition of angina, causes of angina, treatment of angina, and how to decrease risk of having angina.   Cardiac Medications -Review what the following cardiac medications are used for, how they affect the body, and side effects that may occur when taking the medications.  Medications include  Aspirin, Beta blockers, calcium channel blockers, ACE Inhibitors, angiotensin receptor blockers, diuretics, digoxin, and antihyperlipidemics.   Congestive Heart Failure -Discuss the definition of CHF, how to live with CHF, the signs and symptoms of CHF, and how keep track of weight and sodium intake.   Heart Disease and Intimacy -Discus the effect sexual activity has on the heart, how changes occur during intimacy as we age, and safety during sexual activity.   Smoking Cessation / COPD -Discuss different methods to quit smoking, the health benefits of quitting smoking, and the definition of COPD.   Nutrition I: Fats -Discuss the types of  cholesterol, what cholesterol does to the heart, and how cholesterol levels can be controlled.   Nutrition II: Labels -Discuss the different components of food labels and how to read food label   Heart Parts/Heart Disease and PAD -Discuss the anatomy of the heart, the pathway of blood circulation through the heart, and these are affected by heart disease.   Stress I: Signs and Symptoms -Discuss the causes of stress, how stress may lead to anxiety and depression, and ways to limit stress.   Stress II: Relaxation -Discuss different types of relaxation techniques to limit stress.   Warning Signs of Stroke / TIA -Discuss definition of a stroke, what the signs and symptoms are of a stroke, and how to identify when someone is having stroke.   Knowledge Questionnaire Score:   Core Components/Risk Factors/Patient Goals at Admission:  Personal Goals and Risk Factors at Admission - 04/06/23 1559       Core Components/Risk Factors/Patient Goals on Admission    Weight Management Yes;Weight Gain;Weight Maintenance    Intervention Weight Management: Develop a combined nutrition and exercise program designed to reach desired caloric intake, while maintaining appropriate intake of nutrient and fiber, sodium and fats, and appropriate energy expenditure required for the weight goal.;Weight Management: Provide education and appropriate resources to help participant work on and attain dietary goals.    Admit Weight 124 lb 12.8 oz (56.6 kg)    Goal Weight: Short Term 126 lb (57.2 kg)    Goal Weight: Long Term 126 lb (57.2 kg)    Expected Outcomes Short Term: Continue to assess and modify interventions until short term weight is achieved;Long Term: Adherence to nutrition and physical activity/exercise program aimed toward attainment of established weight goal;Weight Maintenance: Understanding of the daily nutrition guidelines, which includes 25-35% calories from fat, 7% or less cal from saturated fats,  less than 200mg  cholesterol, less than 1.5gm of sodium, & 5 or more servings of fruits and vegetables daily;Weight Gain: Understanding of general recommendations for a high calorie, high protein meal plan that promotes weight gain by distributing calorie intake throughout the day with the consumption for 4-5 meals, snacks, and/or supplements    Hypertension Yes    Intervention Provide education on lifestyle modifcations including regular physical activity/exercise, weight management, moderate sodium restriction and increased consumption of fresh fruit, vegetables, and low fat dairy, alcohol moderation, and smoking cessation.;Monitor prescription use compliance.    Expected Outcomes Short Term: Continued assessment and intervention until BP is < 140/69mm HG in hypertensive participants. < 130/77mm HG in hypertensive participants with diabetes, heart failure or chronic kidney disease.;Long Term: Maintenance of blood pressure at goal levels.    Lipids Yes    Intervention Provide education and support for participant on nutrition & aerobic/resistive exercise along with prescribed medications to achieve LDL 70mg , HDL >40mg .    Expected Outcomes Short Term: Participant states understanding of desired  cholesterol values and is compliant with medications prescribed. Participant is following exercise prescription and nutrition guidelines.;Long Term: Cholesterol controlled with medications as prescribed, with individualized exercise RX and with personalized nutrition plan. Value goals: LDL < 70mg , HDL > 40 mg.             Core Components/Risk Factors/Patient Goals Review:    Core Components/Risk Factors/Patient Goals at Discharge (Final Review):    ITP Comments:  ITP Comments     Row Name 04/06/23 1451           ITP Comments Patient attend orientation today.  Patient is attending Cardiac Rehabilitation Program.  Documentation for diagnosis can be found in 03/18/23.  Reviewed medical chart, RPE/RPD,  gym safety, and program guidelines.  Patient was fitted to equipment they will be using during rehab.  Patient is scheduled to start exercise on Tuesday 04/14/23 at 1330.   Initial ITP created and sent for review and signature by Dr. Dina Rich, Medical Director for Cardiac Rehabilitation Program.                Comments: Initial ITP

## 2023-04-13 ENCOUNTER — Ambulatory Visit: Payer: Medicare HMO | Admitting: Cardiovascular Disease

## 2023-04-14 ENCOUNTER — Encounter (HOSPITAL_COMMUNITY)
Admission: RE | Admit: 2023-04-14 | Discharge: 2023-04-14 | Disposition: A | Discharge status: Home / Self Care | Payer: Medicare HMO | Source: Ambulatory Visit | Attending: Cardiovascular Disease | Admitting: Cardiovascular Disease

## 2023-04-14 DIAGNOSIS — Z955 Presence of coronary angioplasty implant and graft: Secondary | ICD-10-CM

## 2023-04-14 NOTE — Progress Notes (Signed)
Daily Session Note  Patient Details  Name: Cheryl [name] MRN: [id number] Date of Birth: 31-Oct-1950 Referring Provider:   Flowsheet Row CARDIAC REHAB PHASE II ORIENTATION from 04/06/2023 in [facility] CARDIAC REHABILITATION  Referring Provider Verne Carrow MD  [Primary Cardiologist: Dr. Theron Arista Nishan]       Encounter Date: 04/14/2023  Check In:  Session Check In - 04/14/23 1346       Check-In   Supervising physician immediately available to respond to emergencies See telemetry face sheet for immediately available MD    Staff Present Fabio Pierce, MA, RCEP, CCRP, Dow Adolph, RN, BSN;Brittany Foley, BSN, RN    Virtual Visit No    Medication changes reported     No    Fall or balance concerns reported    No    Warm-up and Cool-down Performed on first and last piece of equipment    Resistance Training Performed Yes    VAD Patient? No    PAD/SET Patient? No      Pain Assessment   Currently in Pain? No/denies             Capillary Blood Glucose: No results found for this or any previous visit (from the past 24 hours).    Social History   Tobacco Use  Smoking Status Never   Passive exposure: Never  Smokeless Tobacco Not on file    Goals Met:  Exercise tolerated well No report of concerns or symptoms today Strength training completed today  Goals Unmet:  Not Applicable  Comments: First full day of exercise!  Patient was oriented to gym and equipment including functions, settings, policies, and procedures.  Patient's individual exercise prescription and treatment plan were reviewed.  All starting workloads were established based on the results of the 6 minute walk test done at initial orientation visit.  The plan for exercise progression was also introduced and progression will be customized based on patient's performance and goals.

## 2023-04-16 ENCOUNTER — Other Ambulatory Visit (HOSPITAL_COMMUNITY): Payer: Self-pay

## 2023-04-16 ENCOUNTER — Encounter (HOSPITAL_COMMUNITY): Payer: Medicare HMO

## 2023-04-16 ENCOUNTER — Inpatient Hospital Stay: Payer: Medicare HMO | Admitting: Internal Medicine

## 2023-04-17 ENCOUNTER — Encounter (HOSPITAL_COMMUNITY)
Admission: RE | Admit: 2023-04-17 | Discharge: 2023-04-17 | Disposition: A | Discharge status: Home / Self Care | Payer: Medicare HMO | Source: Ambulatory Visit | Attending: Cardiovascular Disease | Admitting: Cardiovascular Disease

## 2023-04-17 DIAGNOSIS — Z955 Presence of coronary angioplasty implant and graft: Secondary | ICD-10-CM | POA: Diagnosis not present

## 2023-04-17 NOTE — Progress Notes (Signed)
Daily Session Note  Patient Details  Name: Cheryl [name] MRN: [id number] Date of Birth: Jan 26, 1951 Referring Provider:   Flowsheet Row CARDIAC REHAB PHASE II ORIENTATION from 04/06/2023 in [facility] CARDIAC REHABILITATION  Referring Provider Verne Carrow MD  [Primary Cardiologist: Dr. Theron Arista Nishan]       Encounter Date: 04/17/2023  Check In:  Session Check In - 04/17/23 1430       Check-In   Supervising physician immediately available to respond to emergencies See telemetry face sheet for immediately available MD    Location [facility]    Staff Present Ross Ludwig, BS, Exercise Physiologist;Phyllis Billingsley, RN    Virtual Visit No    Medication changes reported     No    Fall or balance concerns reported    No    Tobacco Cessation No Change    Warm-up and Cool-down Performed on first and last piece of equipment    Resistance Training Performed Yes    VAD Patient? No    PAD/SET Patient? No      Pain Assessment   Currently in Pain? No/denies    Multiple Pain Sites No             Capillary Blood Glucose: No results found for this or any previous visit (from the past 24 hours).    Social History   Tobacco Use  Smoking Status Never   Passive exposure: Never  Smokeless Tobacco Not on file    Goals Met:  Independence with exercise equipment Exercise tolerated well No report of concerns or symptoms today Strength training completed today  Goals Unmet:  Not Applicable  Comments: Pt able to follow exercise prescription today without complaint.  Will continue to monitor for progression.

## 2023-04-21 ENCOUNTER — Encounter (HOSPITAL_COMMUNITY)
Admission: RE | Admit: 2023-04-21 | Discharge: 2023-04-21 | Disposition: A | Discharge status: Home / Self Care | Payer: Medicare HMO | Source: Ambulatory Visit | Attending: Cardiovascular Disease

## 2023-04-21 DIAGNOSIS — Z955 Presence of coronary angioplasty implant and graft: Secondary | ICD-10-CM

## 2023-04-21 NOTE — Progress Notes (Signed)
 Daily Session Note  Patient Details  Name: Cheryl [name] MRN: [id number] Date of Birth: 1950/11/19 Referring Provider:   Flowsheet Row CARDIAC REHAB PHASE II ORIENTATION from 04/06/2023 in [facility] CARDIAC REHABILITATION  Referring Provider Verne Carrow MD  [Primary Cardiologist: Dr. Theron Arista Nishan]       Encounter Date: 04/21/2023  Check In:  Session Check In - 04/21/23 1330       Check-In   Supervising physician immediately available to respond to emergencies See telemetry face sheet for immediately available MD    Location [facility]    Staff Present Ross Ludwig, BS, Exercise Physiologist;Phyllis Billingsley, RN    Virtual Visit No    Medication changes reported     No    Fall or balance concerns reported    No    Tobacco Cessation No Change    Warm-up and Cool-down Performed on first and last piece of equipment    Resistance Training Performed Yes    VAD Patient? No    PAD/SET Patient? No      Pain Assessment   Currently in Pain? No/denies    Multiple Pain Sites No             Capillary Blood Glucose: No results found for this or any previous visit (from the past 24 hours).    Social History   Tobacco Use  Smoking Status Never   Passive exposure: Never  Smokeless Tobacco Not on file    Goals Met:  Independence with exercise equipment Exercise tolerated well No report of concerns or symptoms today Strength training completed today  Goals Unmet:  Not Applicable  Comments: Pt able to follow exercise prescription today without complaint.  Will continue to monitor for progression.

## 2023-04-22 ENCOUNTER — Ambulatory Visit (INDEPENDENT_AMBULATORY_CARE_PROVIDER_SITE_OTHER): Payer: Medicare HMO | Admitting: Internal Medicine

## 2023-04-22 ENCOUNTER — Encounter (HOSPITAL_COMMUNITY): Payer: Self-pay | Admitting: *Deleted

## 2023-04-22 ENCOUNTER — Encounter: Payer: Self-pay | Admitting: Internal Medicine

## 2023-04-22 VITALS — BP 124/64 | HR 73 | Temp 97.1°F | Resp 16 | Ht 64.0 in | Wt 123.8 lb

## 2023-04-22 DIAGNOSIS — Z0001 Encounter for general adult medical examination with abnormal findings: Secondary | ICD-10-CM

## 2023-04-22 DIAGNOSIS — M3500 Sicca syndrome, unspecified: Secondary | ICD-10-CM | POA: Insufficient documentation

## 2023-04-22 DIAGNOSIS — D869 Sarcoidosis, unspecified: Secondary | ICD-10-CM

## 2023-04-22 DIAGNOSIS — J301 Allergic rhinitis due to pollen: Secondary | ICD-10-CM | POA: Insufficient documentation

## 2023-04-22 DIAGNOSIS — I152 Hypertension secondary to endocrine disorders: Secondary | ICD-10-CM

## 2023-04-22 DIAGNOSIS — Z955 Presence of coronary angioplasty implant and graft: Secondary | ICD-10-CM

## 2023-04-22 DIAGNOSIS — J339 Nasal polyp, unspecified: Secondary | ICD-10-CM | POA: Insufficient documentation

## 2023-04-22 DIAGNOSIS — E785 Hyperlipidemia, unspecified: Secondary | ICD-10-CM | POA: Diagnosis not present

## 2023-04-22 DIAGNOSIS — N1832 Chronic kidney disease, stage 3b: Secondary | ICD-10-CM | POA: Diagnosis not present

## 2023-04-22 DIAGNOSIS — H9313 Tinnitus, bilateral: Secondary | ICD-10-CM | POA: Insufficient documentation

## 2023-04-22 DIAGNOSIS — M545 Low back pain, unspecified: Secondary | ICD-10-CM | POA: Insufficient documentation

## 2023-04-22 DIAGNOSIS — E519 Thiamine deficiency, unspecified: Secondary | ICD-10-CM

## 2023-04-22 DIAGNOSIS — D538 Other specified nutritional anemias: Secondary | ICD-10-CM

## 2023-04-22 DIAGNOSIS — R700 Elevated erythrocyte sedimentation rate: Secondary | ICD-10-CM | POA: Insufficient documentation

## 2023-04-22 DIAGNOSIS — H04129 Dry eye syndrome of unspecified lacrimal gland: Secondary | ICD-10-CM | POA: Insufficient documentation

## 2023-04-22 DIAGNOSIS — Z0000 Encounter for general adult medical examination without abnormal findings: Secondary | ICD-10-CM | POA: Diagnosis not present

## 2023-04-22 DIAGNOSIS — M7061 Trochanteric bursitis, right hip: Secondary | ICD-10-CM | POA: Insufficient documentation

## 2023-04-22 DIAGNOSIS — Z23 Encounter for immunization: Secondary | ICD-10-CM | POA: Insufficient documentation

## 2023-04-22 DIAGNOSIS — Z1211 Encounter for screening for malignant neoplasm of colon: Secondary | ICD-10-CM | POA: Insufficient documentation

## 2023-04-22 DIAGNOSIS — M818 Other osteoporosis without current pathological fracture: Secondary | ICD-10-CM | POA: Diagnosis not present

## 2023-04-22 DIAGNOSIS — J309 Allergic rhinitis, unspecified: Secondary | ICD-10-CM | POA: Insufficient documentation

## 2023-04-22 DIAGNOSIS — Z1231 Encounter for screening mammogram for malignant neoplasm of breast: Secondary | ICD-10-CM

## 2023-04-22 DIAGNOSIS — N184 Chronic kidney disease, stage 4 (severe): Secondary | ICD-10-CM

## 2023-04-22 DIAGNOSIS — M150 Primary generalized (osteo)arthritis: Secondary | ICD-10-CM | POA: Insufficient documentation

## 2023-04-22 LAB — CBC WITH DIFFERENTIAL/PLATELET
Basophils Absolute: 0 10*3/uL (ref 0.0–0.1)
Basophils Relative: 0.7 % (ref 0.0–3.0)
Eosinophils Absolute: 0.2 10*3/uL (ref 0.0–0.7)
Eosinophils Relative: 3.3 % (ref 0.0–5.0)
HCT: 29.3 % — ABNORMAL LOW (ref 36.0–46.0)
Hemoglobin: 9.6 g/dL — ABNORMAL LOW (ref 12.0–15.0)
Lymphocytes Relative: 27.2 % (ref 12.0–46.0)
Lymphs Abs: 1.6 10*3/uL (ref 0.7–4.0)
MCHC: 32.7 g/dL (ref 30.0–36.0)
MCV: 96.9 fL (ref 78.0–100.0)
Monocytes Absolute: 0.4 10*3/uL (ref 0.1–1.0)
Monocytes Relative: 6.7 % (ref 3.0–12.0)
Neutro Abs: 3.6 10*3/uL (ref 1.4–7.7)
Neutrophils Relative %: 62.1 % (ref 43.0–77.0)
Platelets: 229 10*3/uL (ref 150.0–400.0)
RBC: 3.03 Mil/uL — ABNORMAL LOW (ref 3.87–5.11)
RDW: 15.6 % — ABNORMAL HIGH (ref 11.5–15.5)
WBC: 5.8 10*3/uL (ref 4.0–10.5)

## 2023-04-22 LAB — PHOSPHORUS: Phosphorus: 4.4 mg/dL (ref 2.3–4.6)

## 2023-04-22 LAB — BASIC METABOLIC PANEL
BUN: 33 mg/dL — ABNORMAL HIGH (ref 6–23)
CO2: 22 meq/L (ref 19–32)
Calcium: 10.3 mg/dL (ref 8.4–10.5)
Chloride: 108 meq/L (ref 96–112)
Creatinine, Ser: 1.68 mg/dL — ABNORMAL HIGH (ref 0.40–1.20)
GFR: 29.69 mL/min — ABNORMAL LOW (ref >60.00)
Glucose, Bld: 94 mg/dL (ref 70–99)
Potassium: 4.5 meq/L (ref 3.5–5.1)
Sodium: 140 meq/L (ref 135–145)

## 2023-04-22 MED ORDER — BOOSTRIX 5-2.5-18.5 LF-MCG/0.5 IM SUSP: 0.5000 mL | Freq: Once | INTRAMUSCULAR | 0 refills | Status: AC

## 2023-04-22 NOTE — Patient Instructions (Signed)

## 2023-04-22 NOTE — Progress Notes (Unsigned)
 Subjective:  Patient ID: Cheryl [name], female    DOB: [birth year]  Age: 73 y.o. MRN: [id number]  CC: Annual Exam, Hypothyroidism, and Hyperlipidemia   HPI Cheryl [name] presents for a CPX and f/up ---   Discussed the use of AI scribe software for clinical note transcription with the patient, who gave verbal consent to proceed.  History of Present Illness   Cheryl [name] is a 73 year old female who presents with worsening fatigue following a cardiac stent placement.  She has been experiencing worsening fatigue since undergoing a cardiac stent placement. Her energy levels have significantly decreased, and she describes having even less energy than before the procedure. This fatigue has impacted her daily activities, as she was unable to complete grocery shopping due to exhaustion and had to call her husband for assistance.  Her medications were adjusted following the stent placement, with some being removed and others switched to new ones. She mentions having four new medications but does not specify their names or dosages.  No chest pain, shortness of breath, or irregular heartbeats. She reports occasional dizziness and lightheadedness but has not noticed any swelling in her legs or feet.        Outpatient Medications Prior to Visit  Medication Sig Dispense Refill   alendronate (FOSAMAX) 70 MG tablet Take 70 mg by mouth once a week. Take with a full glass of water on an empty stomach.     amLODipine (NORVASC) 5 MG tablet Take 1 tablet (5 mg total) by mouth daily. 30 tablet 11   Apoaequorin (PREVAGEN PO) Take 1 tablet by mouth daily.     aspirin EC 81 MG tablet Take 1 tablet (81 mg total) by mouth daily. Swallow whole. 90 tablet 2   atorvastatin (LIPITOR) 80 MG tablet Take 1 tablet (80 mg total) by mouth daily. 30 tablet 11   bimatoprost (LUMIGAN) 0.01 % SOLN Place 1 drop into both eyes at bedtime.     BIOTIN PO Take 1 tablet by mouth daily.     budesonide-formoterol  (SYMBICORT) 80-4.5 MCG/ACT inhaler Inhale 2 puffs into the lungs daily as needed (for shortness of breath).     clopidogrel (PLAVIX) 75 MG tablet Take 1 tablet (75 mg total) by mouth daily. 90 tablet 2   nitroGLYCERIN (NITROSTAT) 0.4 MG SL tablet Place 1 tablet (0.4 mg total) under the tongue every 5 (five) minutes as needed. 25 tablet 2   Omega-3 Fatty Acids (FISH OIL PO) Take 1 capsule by mouth daily.     potassium chloride SA (KLOR-CON M20) 20 MEQ tablet Take 1 tablet (20 mEq total) by mouth daily. 90 tablet 1   spironolactone (ALDACTONE) 100 MG tablet Take 0.5 tablets (50 mg total) by mouth daily.     SYNTHROID 50 MCG tablet Take 50 mcg by mouth every morning.     thiamine (VITAMIN B1) 100 MG tablet Take 1 tablet (100 mg total) by mouth daily. 90 tablet 1   No facility-administered medications prior to visit.    ROS Review of Systems  Objective:  BP 124/64 (BP Location: Left Arm, Patient Position: Sitting, Cuff Size: Normal)   Pulse 73   Temp (!) 97.1 F (36.2 C) (Oral)   Resp 16   Ht 5\' 4"  (1.626 m)   Wt 123 lb 12.8 oz (56.2 kg)   SpO2 95%   BMI 21.25 kg/m   BP Readings from Last 3 Encounters:  04/22/23 124/64  03/27/23 (!) 120/54  03/19/23 137/78    Wt  Readings from Last 3 Encounters:  04/22/23 123 lb 12.8 oz (56.2 kg)  04/06/23 124 lb 12.8 oz (56.6 kg)  03/27/23 123 lb 6.4 oz (56 kg)    Physical Exam  Lab Results  Component Value Date   WBC 6.5 03/19/2023   HGB 9.6 (L) 03/19/2023   HCT 29.0 (L) 03/19/2023   PLT 201 03/19/2023   GLUCOSE 82 03/19/2023   CHOL 160 10/03/2022   TRIG 95.0 10/03/2022   HDL 56.70 10/03/2022   LDLCALC 84 10/03/2022   ALT 10 10/03/2022   AST 22 10/03/2022   NA 135 03/19/2023   K 3.4 (L) 03/19/2023   CL 105 03/19/2023   CREATININE 1.30 (H) 03/19/2023   BUN 19 03/19/2023   CO2 21 (L) 03/19/2023   TSH 1.34 10/03/2022    No results found.  Assessment & Plan:  Stage 3b chronic kidney disease (HCC) -     Basic metabolic  panel; Future -     VITAMIN D 25 Hydroxy (Vit-D Deficiency, Fractures); Future  Anemia due to acquired thiamine deficiency -     CBC with Differential/Platelet; Future  Sarcoidosis -     Basic metabolic panel; Future -     VITAMIN D 25 Hydroxy (Vit-D Deficiency, Fractures); Future  Hyperlipidemia LDL goal <70  Encounter for general adult medical examination with abnormal findings  Visit for screening mammogram -     Digital Screening Mammogram, Left and Right; Future  Other osteoporosis without current pathological fracture -     Phosphorus; Future -     VITAMIN D 25 Hydroxy (Vit-D Deficiency, Fractures); Future  Need for prophylactic vaccination with combined diphtheria-tetanus-pertussis (DTP) vaccine -     Boostrix; Inject 0.5 mLs into the muscle once for 1 dose.  Dispense: 0.5 mL; Refill: 0  Screening for colon cancer -     Cologuard     Follow-up: Return in about 6 months (around 10/20/2023).  Sanda Linger, MD

## 2023-04-22 NOTE — Progress Notes (Signed)
 Cardiac Individual Treatment Plan  Patient Details  Name: Cheryl [name] MRN: [id number] Date of Birth: [birth year] Referring Provider:   Flowsheet Row CARDIAC REHAB PHASE II ORIENTATION from 04/06/2023 in [facility] CARDIAC REHABILITATION  Referring Provider Verne Carrow MD  [Primary Cardiologist: Dr. Theron Arista Nishan]       Initial Encounter Date:  Flowsheet Row CARDIAC REHAB PHASE II ORIENTATION from 04/06/2023 in [place] CARDIAC REHABILITATION  Date 04/06/23       Visit Diagnosis: Status post coronary artery stent placement  Patient's Home Medications on Admission:  Current Outpatient Medications:    alendronate (FOSAMAX) 70 MG tablet, Take 70 mg by mouth once a week. Take with a full glass of water on an empty stomach., Disp: , Rfl:    amLODipine (NORVASC) 5 MG tablet, Take 1 tablet (5 mg total) by mouth daily., Disp: 30 tablet, Rfl: 11   Apoaequorin (PREVAGEN PO), Take 1 tablet by mouth daily., Disp: , Rfl:    aspirin EC 81 MG tablet, Take 1 tablet (81 mg total) by mouth daily. Swallow whole., Disp: 90 tablet, Rfl: 2   atorvastatin (LIPITOR) 80 MG tablet, Take 1 tablet (80 mg total) by mouth daily., Disp: 30 tablet, Rfl: 11   bimatoprost (LUMIGAN) 0.01 % SOLN, Place 1 drop into both eyes at bedtime., Disp: , Rfl:    BIOTIN PO, Take 1 tablet by mouth daily., Disp: , Rfl:    budesonide-formoterol (SYMBICORT) 80-4.5 MCG/ACT inhaler, Inhale 2 puffs into the lungs daily as needed (for shortness of breath)., Disp: , Rfl:    clopidogrel (PLAVIX) 75 MG tablet, Take 1 tablet (75 mg total) by mouth daily., Disp: 90 tablet, Rfl: 2   nitroGLYCERIN (NITROSTAT) 0.4 MG SL tablet, Place 1 tablet (0.4 mg total) under the tongue every 5 (five) minutes as needed., Disp: 25 tablet, Rfl: 2   Omega-3 Fatty Acids (FISH OIL PO), Take 1 capsule by mouth daily., Disp: , Rfl:    potassium chloride SA (KLOR-CON M20) 20 MEQ tablet, Take 1 tablet (20 mEq total) by mouth daily., Disp: 90 tablet, Rfl:  1   spironolactone (ALDACTONE) 100 MG tablet, Take 0.5 tablets (50 mg total) by mouth daily., Disp: , Rfl:    SYNTHROID 50 MCG tablet, Take 50 mcg by mouth every morning., Disp: , Rfl:    Tdap (BOOSTRIX) 5-2.5-18.5 LF-MCG/0.5 injection, Inject 0.5 mLs into the muscle once for 1 dose., Disp: 0.5 mL, Rfl: 0   thiamine (VITAMIN B1) 100 MG tablet, Take 1 tablet (100 mg total) by mouth daily., Disp: 90 tablet, Rfl: 1  Past Medical History: Past Medical History:  Diagnosis Date   AAA (abdominal aortic aneurysm) (HCC)    Arthritis    osteoarthritis   Hyperlipidemia    Hypertension    Hypoaldosteronism (HCC)    Renal disorder    renal insufficiency   Sarcoidosis    Thyroid disease    hypothyroidism    Tobacco Use: Social History   Tobacco Use  Smoking Status Never   Passive exposure: Never  Smokeless Tobacco Not on file    Labs: Review Flowsheet       Latest Ref Rng & Units 11/26/2020 01/02/2022 10/03/2022  Labs for ITP Cardiac and Pulmonary Rehab  Cholestrol 0 - 200 mg/dL 478     295  621   LDL (calc) 0 - 99 mg/dL 71     54  84   HDL-C >30.86 mg/dL 54     57.84  69.62   Trlycerides 0.0 -  149.0 mg/dL 74     16.1  09.6     Details       This result is from an external source.         Capillary Blood Glucose: Lab Results  Component Value Date   GLUCAP 136 (H) 03/18/2023   GLUCAP 73 03/18/2023   GLUCAP 89 03/18/2023     Exercise Target Goals: Exercise Program Goal: Individual exercise prescription set using results from initial 6 min walk test and THRR while considering  patient's activity barriers and safety.   Exercise Prescription Goal: Starting with aerobic activity 30 plus minutes a day, 3 days per week for initial exercise prescription. Provide home exercise prescription and guidelines that participant acknowledges understanding prior to discharge.  Activity Barriers & Risk Stratification:  Activity Barriers & Cardiac Risk Stratification - 04/06/23 1318        Activity Barriers & Cardiac Risk Stratification   Activity Barriers Arthritis;Back Problems;Neck/Spine Problems;Balance Concerns;History of Falls;Deconditioning;Muscular Weakness   on going back pain, dizzy spells since stent   Cardiac Risk Stratification Moderate             6 Minute Walk:  6 Minute Walk     Row Name 04/06/23 1457         6 Minute Walk   Phase Initial     Distance 1050 feet     Walk Time 6 minutes     # of Rest Breaks 0     MPH 1.99     METS 2.54     RPE 13     Perceived Dyspnea  1     VO2 Peak 8.88     Symptoms Yes (comment)     Comments R knee pain 3/10, SOB     Resting HR 59 bpm     Resting BP 112/58     Resting Oxygen Saturation  99 %     Exercise Oxygen Saturation  during 6 min walk 99 %     Max Ex. HR 105 bpm     Max Ex. BP 128/74     2 Minute Post BP 108/56              Oxygen Initial Assessment:   Oxygen Re-Evaluation:   Oxygen Discharge (Final Oxygen Re-Evaluation):   Initial Exercise Prescription:  Initial Exercise Prescription - 04/06/23 1400       Date of Initial Exercise RX and Referring Provider   Date 04/06/23    Referring Provider Verne Carrow MD   Primary Cardiologist: Dr. Charlton Haws     Oxygen   Maintain Oxygen Saturation 88% or higher      Treadmill   MPH 1.7    Grade 0.5    Minutes 15    METs 2.42      NuStep   Level 1    SPM 80    Minutes 15    METs 2      Prescription Details   Frequency (times per week) 2    Duration Progress to 30 minutes of continuous aerobic without signs/symptoms of physical distress      Intensity   THRR 40-80% of Max Heartrate 94-129    Ratings of Perceived Exertion 11-13    Perceived Dyspnea 0-4      Progression   Progression Continue to progress workloads to maintain intensity without signs/symptoms of physical distress.      Resistance Training   Training Prescription Yes    Weight 2 lb  Reps 10-15             Perform Capillary Blood  Glucose checks as needed.  Exercise Prescription Changes:   Exercise Prescription Changes     Row Name 04/06/23 1400 04/14/23 1500 04/21/23 1500         Response to Exercise   Blood Pressure (Admit) 112/58 108/60 116/62     Blood Pressure (Exercise) 128/74 132/70 110/50     Blood Pressure (Exit) 108/56 112/70 118/62     Heart Rate (Admit) 59 bpm 90 bpm 82 bpm     Heart Rate (Exercise) 105 bpm 98 bpm 92 bpm     Heart Rate (Exit) 62 bpm 84 bpm 78 bpm     Oxygen Saturation (Admit) 99 % -- --     Oxygen Saturation (Exercise) 99 % -- --     Rating of Perceived Exertion (Exercise) 13 13 12      Perceived Dyspnea (Exercise) 1 1 --     Symptoms R knee pain 3/10, SOB -- --     Comments walk test results -- --     Duration -- Continue with 30 min of aerobic exercise without signs/symptoms of physical distress. Continue with 30 min of aerobic exercise without signs/symptoms of physical distress.     Intensity -- THRR unchanged THRR unchanged       Progression   Progression -- Continue to progress workloads to maintain intensity without signs/symptoms of physical distress. Continue to progress workloads to maintain intensity without signs/symptoms of physical distress.       Resistance Training   Training Prescription -- Yes Yes     Weight -- 2 2     Reps -- 10-15 10-15       Treadmill   MPH -- 1.3 1.7     Grade -- 0.5 0     Minutes -- 15 15     METs -- 2.08 2.3       NuStep   Level -- 1 2     SPM -- 60 62     Minutes -- 15 15     METs -- 1.7 1.5              Exercise Comments:   Exercise Comments     Row Name 04/14/23 1347           Exercise Comments : First full day of exercise!  Patient was oriented to gym and equipment including functions, settings, policies, and procedures.  Patient's individual exercise prescription and treatment plan were reviewed.  All starting workloads were established based on the results of the 6 minute walk test done at initial orientation  visit.  The plan for exercise progression was also introduced and progression will be customized based on patient's performance and goals.                Exercise Goals and Review:   Exercise Goals     Row Name 04/06/23 1459             Exercise Goals   Increase Physical Activity Yes       Intervention Provide advice, education, support and counseling about physical activity/exercise needs.;Develop an individualized exercise prescription for aerobic and resistive training based on initial evaluation findings, risk stratification, comorbidities and participant's personal goals.       Expected Outcomes Short Term: Attend rehab on a regular basis to increase amount of physical activity.;Long Term: Add in home exercise to make exercise part of routine  and to increase amount of physical activity.;Long Term: Exercising regularly at least 3-5 days a week.       Increase Strength and Stamina Yes       Intervention Provide advice, education, support and counseling about physical activity/exercise needs.;Develop an individualized exercise prescription for aerobic and resistive training based on initial evaluation findings, risk stratification, comorbidities and participant's personal goals.       Expected Outcomes Short Term: Increase workloads from initial exercise prescription for resistance, speed, and METs.;Short Term: Perform resistance training exercises routinely during rehab and add in resistance training at home;Long Term: Improve cardiorespiratory fitness, muscular endurance and strength as measured by increased METs and functional capacity ( )       Able to understand and use rate of perceived exertion (RPE) scale Yes       Intervention Provide education and explanation on how to use RPE scale       Expected Outcomes Short Term: Able to use RPE daily in rehab to express subjective intensity level;Long Term:  Able to use RPE to guide intensity level when exercising independently        Able to understand and use Dyspnea scale Yes       Intervention Provide education and explanation on how to use Dyspnea scale       Expected Outcomes Short Term: Able to use Dyspnea scale daily in rehab to express subjective sense of shortness of breath during exertion;Long Term: Able to use Dyspnea scale to guide intensity level when exercising independently       Knowledge and understanding of Target Heart Rate Range (THRR) Yes       Intervention Provide education and explanation of THRR including how the numbers were predicted and where they are located for reference       Expected Outcomes Short Term: Able to state/look up THRR;Long Term: Able to use THRR to govern intensity when exercising independently;Short Term: Able to use daily as guideline for intensity in rehab       Able to check pulse independently Yes       Intervention Provide education and demonstration on how to check pulse in carotid and radial arteries.;Review the importance of being able to check your own pulse for safety during independent exercise       Expected Outcomes Short Term: Able to explain why pulse checking is important during independent exercise;Long Term: Able to check pulse independently and accurately       Understanding of Exercise Prescription Yes       Intervention Provide education, explanation, and written materials on patient's individual exercise prescription       Expected Outcomes Short Term: Able to explain program exercise prescription;Long Term: Able to explain home exercise prescription to exercise independently                Exercise Goals Re-Evaluation :  Exercise Goals Re-Evaluation     Row Name 04/14/23 1347             Exercise Goal Re-Evaluation   Exercise Goals Review Able to understand and use rate of perceived exertion (RPE) scale;Knowledge and understanding of Target Heart Rate Range (THRR);Understanding of Exercise Prescription;Able to understand and use Dyspnea scale        Comments Reviewed RPE and dyspnea scale, THR and program prescription with pt today.  Pt voiced understanding and was given a copy of goals to take home.       Expected Outcomes Short: Use RPE daily to regulate intensity.  Long: Follow program prescription in THR.                 Discharge Exercise Prescription (Final Exercise Prescription Changes):  Exercise Prescription Changes - 04/21/23 1500       Response to Exercise   Blood Pressure (Admit) 116/62    Blood Pressure (Exercise) 110/50    Blood Pressure (Exit) 118/62    Heart Rate (Admit) 82 bpm    Heart Rate (Exercise) 92 bpm    Heart Rate (Exit) 78 bpm    Rating of Perceived Exertion (Exercise) 12    Duration Continue with 30 min of aerobic exercise without signs/symptoms of physical distress.    Intensity THRR unchanged      Progression   Progression Continue to progress workloads to maintain intensity without signs/symptoms of physical distress.      Resistance Training   Training Prescription Yes    Weight 2    Reps 10-15      Treadmill   MPH 1.7    Grade 0    Minutes 15    METs 2.3      NuStep   Level 2    SPM 62    Minutes 15    METs 1.5             Nutrition:  Target Goals: Understanding of nutrition guidelines, daily intake of sodium 1500mg , cholesterol 200mg , calories 30% from fat and 7% or less from saturated fats, daily to have 5 or more servings of fruits and vegetables.  Biometrics:  Pre Biometrics - 04/06/23 1500       Pre Biometrics   Height 5\' 4"  (1.626 m)    Weight 124 lb 12.8 oz (56.6 kg)    Waist Circumference 28 inches    Hip Circumference 34 inches    Waist to Hip Ratio 0.82 %    BMI (Calculated) 21.41    Grip Strength 21.5 kg    Single Leg Stand 7.8 seconds              Nutrition Therapy Plan and Nutrition Goals:  Nutrition Therapy & Goals - 04/06/23 1326       Intervention Plan   Intervention Prescribe, educate and counsel regarding individualized specific  dietary modifications aiming towards targeted core components such as weight, hypertension, lipid management, diabetes, heart failure and other comorbidities.;Nutrition handout(s) given to patient.    Expected Outcomes Short Term Goal: Understand basic principles of dietary content, such as calories, fat, sodium, cholesterol and nutrients.;Long Term Goal: Adherence to prescribed nutrition plan.             Nutrition Assessments:  MEDIFICTS Score Key: >=70 Need to make dietary changes  40-70 Heart Healthy Diet <= 40 Therapeutic Level Cholesterol Diet   Picture Your Plate Scores: <16 Unhealthy dietary pattern with much room for improvement. 41-50 Dietary pattern unlikely to meet recommendations for good health and room for improvement. 51-60 More healthful dietary pattern, with some room for improvement.  >60 Healthy dietary pattern, although there may be some specific behaviors that could be improved.    Nutrition Goals Re-Evaluation:   Nutrition Goals Discharge (Final Nutrition Goals Re-Evaluation):   Psychosocial: Target Goals: Acknowledge presence or absence of significant depression and/or stress, maximize coping skills, provide positive support system. Participant is able to verbalize types and ability to use techniques and skills needed for reducing stress and depression.  Initial Review & Psychosocial Screening:  Initial Psych Review & Screening - 04/06/23 1324  Initial Review   Current issues with Current Stress Concerns;Current Depression    Source of Stress Concerns Unable to participate in former interests or hobbies;Unable to perform yard/household activities;Chronic Illness    Comments no appetite and not eating thus has loss of energy, not getting enough protein, dizziness and fatigue causing her not to be able to do things      Family Dynamics   Good Support System? Yes   husband, sisters, has son as well she can call on     Barriers   Psychosocial  barriers to participate in program Psychosocial barriers identified (see note);The patient should benefit from training in stress management and relaxation.      Screening Interventions   Interventions Encouraged to exercise;To provide support and resources with identified psychosocial needs;Provide feedback about the scores to participant    Expected Outcomes Short Term goal: Utilizing psychosocial counselor, staff and physician to assist with identification of specific Stressors or current issues interfering with healing process. Setting desired goal for each stressor or current issue identified.;Long Term Goal: Stressors or current issues are controlled or eliminated.;Short Term goal: Identification and review with participant of any Quality of Life or Depression concerns found by scoring the questionnaire.;Long Term goal: The participant improves quality of Life and PHQ9 Scores as seen by post scores and/or verbalization of changes             Quality of Life Scores:  Quality of Life - 04/14/23 1554       Quality of Life   Select Quality of Life      Quality of Life Scores   Health/Function Pre 28.23 %    Socioeconomic Pre 28.21 %    Psych/Spiritual Pre 27.43 %    Family Pre 28.8 %    GLOBAL Pre 26.82 %            Scores of 19 and below usually indicate a poorer quality of life in these areas.  A difference of  2-3 points is a clinically meaningful difference.  A difference of 2-3 points in the total score of the Quality of Life Index has been associated with significant improvement in overall quality of life, self-image, physical symptoms, and general health in studies assessing change in quality of life.  PHQ-9: Review Flowsheet  More data exists      04/06/2023 10/10/2022 10/03/2022 07/31/2022 07/29/2021  Depression screen PHQ 2/9  Decreased Interest 1 1 1  0 0 0  Down, Depressed, Hopeless 1 0 0 0 0 0  PHQ - 2 Score 2 1 1  0 0 0  Altered sleeping 0 - - 0 -  Tired, decreased  energy 3 - - 0 -  Change in appetite 3 - - 0 -  Feeling bad or failure about yourself  0 - - 0 -  Trouble concentrating 0 - - 0 -  Moving slowly or fidgety/restless 0 - - 0 -  Suicidal thoughts 0 - - 0 -  PHQ-9 Score 8 - - 0 -  Difficult doing work/chores Very difficult - - - -    Details       Multiple values from one day are sorted in reverse-chronological order        Interpretation of Total Score  Total Score Depression Severity:  1-4 = Minimal depression, 5-9 = Mild depression, 10-14 = Moderate depression, 15-19 = Moderately severe depression, 20-27 = Severe depression   Psychosocial Evaluation and Intervention:  Psychosocial Evaluation - 04/06/23 1500  Psychosocial Evaluation & Interventions   Interventions Stress management education;Encouraged to exercise with the program and follow exercise prescription    Comments Maygan is coming into cardiac rehab after a stent to her LAD.  Since her stent she has been having several dizzy spells and dealing with extreme fatigue which have prevented her from being able to do everything that she wants to do.  She is eager to feel better.  She is concerned that she will not be able to do all of the exercise.  She was pleased with how well she did today and not needing to stop during her walk test.  She was assured that she could take rest breaks as needed and that we would only compare her to herself and not others in her class.  She does also have a history of sarcoidosis and renal disease.  She wants to be able to go and do as she pleases and not get tired in the grocery store. We talked about how exercise can help her improve her strength and stamina.  Her PHQ did note some depression and she admitted to having some down days, but felt that it does not last, but last few weeks have definitely be tough on her.  She usually sleeps well.  She has a good support syster in her husband and sister and will call her son when needed.  She also  attends church every Sunday.  Her doctors feel that her dizziness and fatigue may be coming from her loss of appetite as she is not eating and has lost weight.  She was encouraged to make sure she is eating healthy and eating enough protein to maintain her weight and normal activities.  She was open to continuing to work on her diet and trying to eat more.    Expected Outcomes Short: Attend rehab to build up stamina Long: Be able to go shopping like she enjoys    Continue Psychosocial Services  Follow up required by staff             Psychosocial Re-Evaluation:   Psychosocial Discharge (Final Psychosocial Re-Evaluation):   Vocational Rehabilitation: Provide vocational rehab assistance to qualifying candidates.   Vocational Rehab Evaluation & Intervention:  Vocational Rehab - 04/06/23 1319       Initial Vocational Rehab Evaluation & Intervention   Assessment shows need for Vocational Rehabilitation No   retired            Education: Education Goals: Education classes will be provided on a weekly basis, covering required topics. Participant will state understanding/return demonstration of topics presented.  Learning Barriers/Preferences:  Learning Barriers/Preferences - 04/06/23 1327       Learning Barriers/Preferences   Learning Barriers Sight   glasses for reading   Learning Preferences Skilled Demonstration;Written Material             Education Topics: Hypertension, Hypertension Reduction -Define heart disease and high blood pressure. Discus how high blood pressure affects the body and ways to reduce high blood pressure.   Exercise and Your Heart -Discuss why it is important to exercise, the FITT principles of exercise, normal and abnormal responses to exercise, and how to exercise safely.   Angina -Discuss definition of angina, causes of angina, treatment of angina, and how to decrease risk of having angina.   Cardiac Medications -Review what the  following cardiac medications are used for, how they affect the body, and side effects that may occur when taking the medications.  Medications include  Aspirin, Beta blockers, calcium channel blockers, ACE Inhibitors, angiotensin receptor blockers, diuretics, digoxin, and antihyperlipidemics.   Congestive Heart Failure -Discuss the definition of CHF, how to live with CHF, the signs and symptoms of CHF, and how keep track of weight and sodium intake.   Heart Disease and Intimacy -Discus the effect sexual activity has on the heart, how changes occur during intimacy as we age, and safety during sexual activity.   Smoking Cessation / COPD -Discuss different methods to quit smoking, the health benefits of quitting smoking, and the definition of COPD.   Nutrition I: Fats -Discuss the types of cholesterol, what cholesterol does to the heart, and how cholesterol levels can be controlled.   Nutrition II: Labels -Discuss the different components of food labels and how to read food label   Heart Parts/Heart Disease and PAD -Discuss the anatomy of the heart, the pathway of blood circulation through the heart, and these are affected by heart disease.   Stress I: Signs and Symptoms -Discuss the causes of stress, how stress may lead to anxiety and depression, and ways to limit stress.   Stress II: Relaxation -Discuss different types of relaxation techniques to limit stress.   Warning Signs of Stroke / TIA -Discuss definition of a stroke, what the signs and symptoms are of a stroke, and how to identify when someone is having stroke.   Knowledge Questionnaire Score:  Knowledge Questionnaire Score - 04/14/23 1554       Knowledge Questionnaire Score   Pre Score 20/24             Core Components/Risk Factors/Patient Goals at Admission:  Personal Goals and Risk Factors at Admission - 04/06/23 1559       Core Components/Risk Factors/Patient Goals on Admission    Weight Management  Yes;Weight Gain;Weight Maintenance    Intervention Weight Management: Develop a combined nutrition and exercise program designed to reach desired caloric intake, while maintaining appropriate intake of nutrient and fiber, sodium and fats, and appropriate energy expenditure required for the weight goal.;Weight Management: Provide education and appropriate resources to help participant work on and attain dietary goals.    Admit Weight 124 lb 12.8 oz (56.6 kg)    Goal Weight: Short Term 126 lb (57.2 kg)    Goal Weight: Long Term 126 lb (57.2 kg)    Expected Outcomes Short Term: Continue to assess and modify interventions until short term weight is achieved;Long Term: Adherence to nutrition and physical activity/exercise program aimed toward attainment of established weight goal;Weight Maintenance: Understanding of the daily nutrition guidelines, which includes 25-35% calories from fat, 7% or less cal from saturated fats, less than 200mg  cholesterol, less than 1.5gm of sodium, & 5 or more servings of fruits and vegetables daily;Weight Gain: Understanding of general recommendations for a high calorie, high protein meal plan that promotes weight gain by distributing calorie intake throughout the day with the consumption for 4-5 meals, snacks, and/or supplements    Hypertension Yes    Intervention Provide education on lifestyle modifcations including regular physical activity/exercise, weight management, moderate sodium restriction and increased consumption of fresh fruit, vegetables, and low fat dairy, alcohol moderation, and smoking cessation.;Monitor prescription use compliance.    Expected Outcomes Short Term: Continued assessment and intervention until BP is < 140/33mm HG in hypertensive participants. < 130/22mm HG in hypertensive participants with diabetes, heart failure or chronic kidney disease.;Long Term: Maintenance of blood pressure at goal levels.    Lipids Yes    Intervention Provide education and  support for participant on nutrition & aerobic/resistive exercise along with prescribed medications to achieve LDL 70mg , HDL >40mg .    Expected Outcomes Short Term: Participant states understanding of desired cholesterol values and is compliant with medications prescribed. Participant is following exercise prescription and nutrition guidelines.;Long Term: Cholesterol controlled with medications as prescribed, with individualized exercise RX and with personalized nutrition plan. Value goals: LDL < 70mg , HDL > 40 mg.             Core Components/Risk Factors/Patient Goals Review:    Core Components/Risk Factors/Patient Goals at Discharge (Final Review):    ITP Comments:  ITP Comments     Row Name 04/06/23 1451 04/14/23 1347 04/22/23 1357       ITP Comments Patient attend orientation today.  Patient is attending Cardiac Rehabilitation Program.  Documentation for diagnosis can be found in 03/18/23.  Reviewed medical chart, RPE/RPD, gym safety, and program guidelines.  Patient was fitted to equipment they will be using during rehab.  Patient is scheduled to start exercise on Tuesday 04/14/23 at 1330.   Initial ITP created and sent for review and signature by Dr. Dina Rich, Medical Director for Cardiac Rehabilitation Program. : First full day of exercise!  Patient was oriented to gym and equipment including functions, settings, policies, and procedures.  Patient's individual exercise prescription and treatment plan were reviewed.  All starting workloads were established based on the results of the 6 minute walk test done at initial orientation visit.  The plan for exercise progression was also introduced and progression will be customized based on patient's performance and goals. 30 day review completed. ITP sent to Dr. Dina Rich, Medical Director of Cardiac Rehab. Continue with ITP unless changes are made by physician. Still new to program.              Comments: 30 day review

## 2023-04-23 ENCOUNTER — Encounter (HOSPITAL_COMMUNITY)
Admission: RE | Admit: 2023-04-23 | Discharge: 2023-04-23 | Disposition: A | Discharge status: Home / Self Care | Payer: Medicare HMO | Source: Ambulatory Visit | Attending: Cardiovascular Disease

## 2023-04-23 ENCOUNTER — Telehealth: Payer: Self-pay

## 2023-04-23 DIAGNOSIS — Z955 Presence of coronary angioplasty implant and graft: Secondary | ICD-10-CM | POA: Diagnosis not present

## 2023-04-23 LAB — VITAMIN D 25 HYDROXY (VIT D DEFICIENCY, FRACTURES): VITD: 109.53 ng/mL (ref 30.00–100.00)

## 2023-04-23 NOTE — Progress Notes (Signed)
 Daily Session Note  Patient Details  Name: Saranya Harlin MRN: [id number] Date of Birth: January 15, 1951 Referring Provider:   Flowsheet Row CARDIAC REHAB PHASE II ORIENTATION from 04/06/2023 in [facility] CARDIAC REHABILITATION  Referring Provider Verne Carrow MD  [Primary Cardiologist: Dr. Theron Arista Nishan]       Encounter Date: 04/23/2023  Check In:  Session Check In - 04/23/23 1315       Check-In   Supervising physician immediately available to respond to emergencies See telemetry face sheet for immediately available MD    Location [facility]    Staff Present Avanell Shackleton BSN, RN;Debra Laural Benes, RN, BSN    Virtual Visit No    Medication changes reported     No    Fall or balance concerns reported    No    Tobacco Cessation No Change    Warm-up and Cool-down Performed on first and last piece of equipment    Resistance Training Performed Yes    VAD Patient? No    PAD/SET Patient? No      Pain Assessment   Currently in Pain? No/denies    Multiple Pain Sites No             Capillary Blood Glucose: No results found for this or any previous visit (from the past 24 hours).    Social History   Tobacco Use  Smoking Status Never   Passive exposure: Never  Smokeless Tobacco Not on file    Goals Met:  Independence with exercise equipment Exercise tolerated well No report of concerns or symptoms today Strength training completed today  Goals Unmet:  Not Applicable  Comments: Marland KitchenMarland KitchenPt able to follow exercise prescription today without complaint.  Will continue to monitor for progression.

## 2023-04-23 NOTE — Telephone Encounter (Signed)
 CRITICAL VALUE STICKER  CRITICAL VALUE: Vit. D 109.53   DATE & TIME NOTIFIED: 04/23/23 -1045A  MESSENGER (representative from lab): Si  MD NOTIFIED: [name]

## 2023-04-23 NOTE — Telephone Encounter (Signed)
 This has been handled by other means of communication.

## 2023-04-24 ENCOUNTER — Encounter: Payer: Self-pay | Admitting: Internal Medicine

## 2023-04-24 DIAGNOSIS — N184 Chronic kidney disease, stage 4 (severe): Secondary | ICD-10-CM | POA: Insufficient documentation

## 2023-04-28 ENCOUNTER — Other Ambulatory Visit (INDEPENDENT_AMBULATORY_CARE_PROVIDER_SITE_OTHER): Payer: Medicare HMO | Admitting: Pharmacist

## 2023-04-28 ENCOUNTER — Encounter (HOSPITAL_COMMUNITY)
Admission: RE | Admit: 2023-04-28 | Discharge: 2023-04-28 | Disposition: A | Discharge status: Home / Self Care | Payer: Medicare HMO | Source: Ambulatory Visit | Attending: Cardiovascular Disease | Admitting: Cardiovascular Disease

## 2023-04-28 DIAGNOSIS — I2510 Atherosclerotic heart disease of native coronary artery without angina pectoris: Secondary | ICD-10-CM

## 2023-04-28 DIAGNOSIS — Z955 Presence of coronary angioplasty implant and graft: Secondary | ICD-10-CM | POA: Insufficient documentation

## 2023-04-28 DIAGNOSIS — I351 Nonrheumatic aortic (valve) insufficiency: Secondary | ICD-10-CM

## 2023-04-28 DIAGNOSIS — E785 Hyperlipidemia, unspecified: Secondary | ICD-10-CM

## 2023-04-28 DIAGNOSIS — E2740 Unspecified adrenocortical insufficiency: Secondary | ICD-10-CM

## 2023-04-28 DIAGNOSIS — I10 Essential (primary) hypertension: Secondary | ICD-10-CM

## 2023-04-28 NOTE — Progress Notes (Signed)
 Daily Session Note  Patient Details  Name: Anisah Kuck MRN: [id number] Date of Birth: February 27, 1950 Referring Provider:   Flowsheet Row CARDIAC REHAB PHASE II ORIENTATION from 04/06/2023 in [facility] CARDIAC REHABILITATION  Referring Provider Verne Carrow MD  [Primary Cardiologist: Dr. Theron Arista Nishan]       Encounter Date: 04/28/2023  Check In:  Session Check In - 04/28/23 1333       Check-In   Supervising physician immediately available to respond to emergencies See telemetry face sheet for immediately available MD    Location [facility]    Staff Present Ross Ludwig, BS, Exercise Physiologist;Sahej Hauswirth Roseanne Reno, BSN, RN, WTA-C;Phyllis Billingsley, RN    Virtual Visit No    Medication changes reported     No    Fall or balance concerns reported    No    Tobacco Cessation No Change    Warm-up and Cool-down Performed on first and last piece of equipment    Resistance Training Performed Yes    VAD Patient? No    PAD/SET Patient? No      Pain Assessment   Currently in Pain? No/denies    Multiple Pain Sites No             Capillary Blood Glucose: No results found for this or any previous visit (from the past 24 hours).    Social History   Tobacco Use  Smoking Status Never   Passive exposure: Never  Smokeless Tobacco Not on file    Goals Met:  Independence with exercise equipment Exercise tolerated well No report of concerns or symptoms today Strength training completed today  Goals Unmet:  Not Applicable  Comments: Pt able to follow exercise prescription today without complaint.  Will continue to monitor for progression.

## 2023-04-28 NOTE — Patient Instructions (Signed)
 It was a pleasure speaking with you today!  Continue monitoring blood pressure and notify cardiac rehab about dizziness/weakness.  Get fasting labs done around 3/14 at [facility] per cardiologist.  Feel free to call with any questions or concerns!  Arbutus Leas, PharmD, BCPS, CPP Clinical Pharmacist Practitioner [facility] [phone]

## 2023-04-28 NOTE — Progress Notes (Signed)
 04/28/2023 Name: Cheryl [name] MRN: [id number] DOB: Jul 13, 1950  Chief Complaint  Patient presents with   Hypertension   Medication Management    Cheryl [name] is a 73 y.o. year old female who presented for a telephone visit.   They were referred to the pharmacist by their PCP for assistance in managing  HTN and hypokalemia .   Subjective:  Care Team: Primary Care Provider: [name] Grandchild, MD ; Next Scheduled Visit: 08/31/2023 Endocrinologist Dr. Leslie Dales; Next Scheduled Visit: 07/24/2023 Nephrologist: Dr. Ronalee Belts, last visit: 09/17/2022 Cardiologist: Dr. Anne Fu, Next visit 12/29/2022  Medication Access/Adherence  Current Pharmacy:  [id number] - [place], [place] - [address] AT [facility] [address] [place] [address] Phone: [phone] Fax: [phone]  [facility] [address] [place] [address] Phone: [phone] Fax: [phone]   Patient reports affordability concerns with their medications: No  Patient reports access/transportation concerns to their pharmacy: No  Patient reports adherence concerns with their medications:  No     Hypertension: Current medications: amlodipine 5 mg daily, spironolactone 50 mg daily, potassium chloride 20 mEq daily  She is s/p cath with PCI in January, hydralazine was d/c and amlodipine added. Spironolactone and potassium continued. Confirmed she is taking aspirin and clopidogrel.  Home BP readings: Pt reports BP 112/58 this morning (wrist cuff)  Reports s/sx of hypotension such as dizziness or weakness that happen occasionally. She goes to cardiac rehab regularly and they monitor BP as well.   Objective: BP Readings from Last 3 Encounters:  04/28/23 (!) 112/58  04/22/23 124/64  03/27/23 (!) 120/54        Latest Ref Rng & Units 04/22/2023   12:05 PM 03/19/2023    4:20 AM 03/16/2023    2:27 PM  BMP  Glucose 70 - 99 mg/dL 94  82  79   BUN 6 - 23 mg/dL 33  19   37   Creatinine 0.40 - 1.20 mg/dL 7.42  5.95  6.38   BUN/Creat Ratio 12 - 28   21   Sodium 135 - 145 mEq/L 140  135  143   Potassium 3.5 - 5.1 mEq/L 4.5  3.4  4.8   Chloride 96 - 112 mEq/L 108  105  106   CO2 19 - 32 mEq/L 22  21  22    Calcium 8.4 - 10.5 mg/dL 75.6  8.6  43.3      Medications Reviewed Today     Reviewed by Bonita Quin, RPH (Pharmacist) on 04/28/23 at [phone]  Med List Status: <None>   Medication Order Taking? Sig Documenting Provider Last Dose Status Informant  alendronate (FOSAMAX) 70 MG tablet [id number] Yes Take 70 mg by mouth once a week. Take with a full glass of water on an empty stomach. [provider] Taking Active   amLODipine (NORVASC) 5 MG tablet [id number] Yes Take 1 tablet (5 mg total) by mouth daily. Arty Baumgartner, NP Taking Active   Apoaequorin (PREVAGEN PO) [id number]  Take 1 tablet by mouth daily. [provider]  Active Self  aspirin EC 81 MG tablet [id number] Yes Take 1 tablet (81 mg total) by mouth daily. Swallow whole. Arty Baumgartner, NP Taking Active   atorvastatin (LIPITOR) 80 MG tablet [id number] Yes Take 1 tablet (80 mg total) by mouth daily. Arty Baumgartner, NP Taking Active   bimatoprost (LUMIGAN) 0.01 % SOLN [id number]  Place 1 drop into both eyes at bedtime. [provider]  Active Self  BIOTIN PO [id number]  Take 1 tablet by mouth daily. [provider]  Active Self  budesonide-formoterol (SYMBICORT) 80-4.5 MCG/ACT inhaler [id number]  Inhale 2 puffs into the lungs daily as needed (for shortness of breath). [provider]  Active Self  clopidogrel (PLAVIX) 75 MG tablet [id number] Yes Take 1 tablet (75 mg total) by mouth daily. Arty Baumgartner, NP Taking Active   nitroGLYCERIN (NITROSTAT) 0.4 MG SL tablet [id number]  Place 1 tablet (0.4 mg total) under the tongue every 5 (five) minutes as needed. Arty Baumgartner, NP  Active   Omega-3 Fatty Acids (FISH OIL PO) [id number]  Take 1 capsule by  mouth daily. [provider]  Active Self  potassium chloride SA (KLOR-CON M20) 20 MEQ tablet [id number] Yes Take 1 tablet (20 mEq total) by mouth daily. [name] Grandchild, MD Taking Active Self  spironolactone (ALDACTONE) 100 MG tablet [id number] Yes Take 0.5 tablets (50 mg total) by mouth daily. [name] Grandchild, MD Taking Active Self  SYNTHROID 50 MCG tablet [id number] Yes Take 50 mcg by mouth every morning. [provider] Taking Active Self           Med Note Gunnar Fusi, MELISSA R   Mon Mar 16, 2023  4:35 PM) Pt currently TAKING NAME BRAND   thiamine (VITAMIN B1) 100 MG tablet [id number]  Take 1 tablet (100 mg total) by mouth daily. [name] Grandchild, MD  Active Self              Assessment/Plan:  - BP is controlled, BP goal <130/80. Concern for risk of hypotension. Advised her to notify cardiac rehab regarding episodes of dizziness.  - Recommended to monitor BP at home   She is up to date with follow ups with her specialists: renal, rheumatologist for sarcoid, and cardio. Informed pt to get labs done around 3/14 for lipid and LFTs per cardio provider orders.   Follow Up Plan:PRN  Arbutus Leas, PharmD, BCPS, CPP Clinical Pharmacist Practitioner [facility] at [facility] [phone]

## 2023-04-29 ENCOUNTER — Other Ambulatory Visit: Payer: Self-pay | Admitting: Internal Medicine

## 2023-04-29 DIAGNOSIS — I10 Essential (primary) hypertension: Secondary | ICD-10-CM

## 2023-04-30 ENCOUNTER — Encounter (HOSPITAL_COMMUNITY)
Admission: RE | Admit: 2023-04-30 | Discharge: 2023-04-30 | Disposition: A | Discharge status: Home / Self Care | Payer: Medicare HMO | Source: Ambulatory Visit | Attending: Cardiovascular Disease | Admitting: Cardiovascular Disease

## 2023-04-30 DIAGNOSIS — Z955 Presence of coronary angioplasty implant and graft: Secondary | ICD-10-CM | POA: Diagnosis not present

## 2023-04-30 NOTE — Progress Notes (Addendum)
 Daily Session Note  Patient Details  Name: Cheryl [name] MRN: [id number] Date of Birth: 1950/11/11 Referring Provider:   Flowsheet Row CARDIAC REHAB PHASE II ORIENTATION from 04/06/2023 in [facility] CARDIAC REHABILITATION  Referring Provider Verne Carrow MD  [Primary Cardiologist: Dr. Theron Arista Nishan]       Encounter Date: 04/30/2023  Check In:  Session Check In - 04/30/23 1340       Check-In   Supervising physician immediately available to respond to emergencies See telemetry face sheet for immediately available MD    Location [facility]    Staff Present Ross Ludwig, BS, Exercise Physiologist;Hillary Gerald BSN, RN;Sabiha Sura Chaplin, MA, RCEP, CCRP, CCET    Virtual Visit No    Medication changes reported     No    Fall or balance concerns reported    No    Warm-up and Cool-down Performed on first and last piece of equipment    Resistance Training Performed Yes    VAD Patient? No    PAD/SET Patient? No      Pain Assessment   Currently in Pain? No/denies             Capillary Blood Glucose: No results found for this or any previous visit (from the past 24 hours).    Social History   Tobacco Use  Smoking Status Never   Passive exposure: Never  Smokeless Tobacco Not on file    Goals Met:  Independence with exercise equipment Exercise tolerated well No report of concerns or symptoms today Strength training completed today  Goals Unmet:  Not Applicable  Comments: Pt able to follow exercise prescription today without complaint.  Will continue to monitor for progression.  Cardiac:  Reviewed home exercise with pt today.  Pt plans to walk and use bike and pedal machine at home for exercise.  Reviewed THR, pulse, RPE, sign and symptoms, pulse oximetery and when to call 911 or MD.  Also discussed weather considerations and indoor options.  Pt voiced understanding.

## 2023-05-02 LAB — COLOGUARD: COLOGUARD: NEGATIVE

## 2023-05-05 ENCOUNTER — Encounter (HOSPITAL_COMMUNITY)
Admission: RE | Admit: 2023-05-05 | Discharge: 2023-05-05 | Disposition: A | Discharge status: Home / Self Care | Payer: Medicare HMO | Source: Ambulatory Visit | Attending: Cardiovascular Disease

## 2023-05-05 DIAGNOSIS — Z955 Presence of coronary angioplasty implant and graft: Secondary | ICD-10-CM | POA: Diagnosis not present

## 2023-05-05 NOTE — Progress Notes (Signed)
 Daily Session Note  Patient Details  Name: Cheryl [name] MRN: [id number] Date of Birth: 08-Mar-1950 Referring Provider:   Flowsheet Row CARDIAC REHAB PHASE II ORIENTATION from 04/06/2023 in [facility] CARDIAC REHABILITATION  Referring Provider Verne Carrow MD  [Primary Cardiologist: Dr. Theron Arista Nishan]       Encounter Date: 05/05/2023  Check In:  Session Check In - 05/05/23 1330       Check-In   Supervising physician immediately available to respond to emergencies See telemetry face sheet for immediately available MD    Location [facility]    Staff Present Ross Ludwig, BS, Exercise Physiologist;Brittany Roseanne Reno, BSN, RN, WTA-C;Alvaretta Eisenberger, RN;Jessica Weskan, MA, RCEP, CCRP, CCET    Virtual Visit No    Medication changes reported     No    Fall or balance concerns reported    No    Warm-up and Cool-down Performed on first and last piece of equipment    Resistance Training Performed Yes    VAD Patient? No    PAD/SET Patient? No      Pain Assessment   Currently in Pain? No/denies    Multiple Pain Sites No             Capillary Blood Glucose: No results found for this or any previous visit (from the past 24 hours).    Social History   Tobacco Use  Smoking Status Never   Passive exposure: Never  Smokeless Tobacco Not on file    Goals Met:  Independence with exercise equipment Exercise tolerated well No report of concerns or symptoms today Strength training completed today  Goals Unmet:  Not Applicable  Comments: Pt able to follow exercise prescription today without complaint.  Will continue to monitor for progression.

## 2023-05-07 ENCOUNTER — Encounter (HOSPITAL_COMMUNITY)
Admission: RE | Admit: 2023-05-07 | Discharge: 2023-05-07 | Disposition: A | Discharge status: Home / Self Care | Payer: Medicare HMO | Source: Ambulatory Visit | Attending: Cardiovascular Disease | Admitting: Cardiovascular Disease

## 2023-05-07 DIAGNOSIS — Z955 Presence of coronary angioplasty implant and graft: Secondary | ICD-10-CM

## 2023-05-07 NOTE — Progress Notes (Signed)
 Daily Session Note  Patient Details  Name: Cheryl [name] MRN: [id number] Date of Birth: 05-Jul-1950 Referring Provider:   Flowsheet Row CARDIAC REHAB PHASE II ORIENTATION from 04/06/2023 in [facility] CARDIAC REHABILITATION  Referring Provider Verne Carrow MD  [Primary Cardiologist: Dr. Theron Arista Nishan]       Encounter Date: 05/07/2023  Check In:  Session Check In - 05/07/23 1330       Check-In   Supervising physician immediately available to respond to emergencies See telemetry face sheet for immediately available MD    Location [facility]    Staff Present Fabio Pierce, MA, RCEP, CCRP, CCET;Heather Gaynell Face, Exercise Physiologist;Hillary Leonidas Romberg BSN, RN    Virtual Visit No    Medication changes reported     No    Fall or balance concerns reported    No    Warm-up and Cool-down Performed on first and last piece of equipment    Resistance Training Performed Yes    VAD Patient? No    PAD/SET Patient? No      Pain Assessment   Currently in Pain? No/denies             Capillary Blood Glucose: No results found for this or any previous visit (from the past 24 hours).    Social History   Tobacco Use  Smoking Status Never   Passive exposure: Never  Smokeless Tobacco Not on file    Goals Met:  Independence with exercise equipment Exercise tolerated well No report of concerns or symptoms today Strength training completed today  Goals Unmet:  Not Applicable  Comments: Pt able to follow exercise prescription today without complaint.  Will continue to monitor for progression.

## 2023-05-12 ENCOUNTER — Encounter (HOSPITAL_COMMUNITY)
Admission: RE | Admit: 2023-05-12 | Discharge: 2023-05-12 | Disposition: A | Discharge status: Home / Self Care | Payer: Medicare HMO | Source: Ambulatory Visit | Attending: Cardiovascular Disease

## 2023-05-12 DIAGNOSIS — Z955 Presence of coronary angioplasty implant and graft: Secondary | ICD-10-CM | POA: Diagnosis not present

## 2023-05-12 NOTE — Progress Notes (Signed)
 Daily Session Note  Patient Details  Name: Cheryl [name] MRN: [id number] Date of Birth: 1950/09/29 Referring Provider:   Flowsheet Row CARDIAC REHAB PHASE II ORIENTATION from 04/06/2023 in [facility] CARDIAC REHABILITATION  Referring Provider Verne Carrow MD  [Primary Cardiologist: Dr. Theron Arista Nishan]       Encounter Date: 05/12/2023  Check In:  Session Check In - 05/12/23 1330       Check-In   Supervising physician immediately available to respond to emergencies See telemetry face sheet for immediately available MD    Location [facility]    Staff Present Ross Ludwig, BS, Exercise Physiologist;Brittany Roseanne Reno, BSN, RN, WTA-C;Evanny Ellerbe, RN;Jessica Canby, MA, RCEP, CCRP, CCET    Virtual Visit No    Medication changes reported     No    Fall or balance concerns reported    No    Warm-up and Cool-down Performed on first and last piece of equipment    Resistance Training Performed Yes    VAD Patient? No    PAD/SET Patient? No      Pain Assessment   Currently in Pain? No/denies    Multiple Pain Sites No             Capillary Blood Glucose: No results found for this or any previous visit (from the past 24 hours).    Social History   Tobacco Use  Smoking Status Never   Passive exposure: Never  Smokeless Tobacco Not on file    Goals Met:  Independence with exercise equipment Exercise tolerated well No report of concerns or symptoms today Strength training completed today  Goals Unmet:  Not Applicable  Comments: Pt able to follow exercise prescription today without complaint.  Will continue to monitor for progression.

## 2023-05-14 ENCOUNTER — Other Ambulatory Visit (HOSPITAL_COMMUNITY): Payer: Self-pay

## 2023-05-14 ENCOUNTER — Encounter (HOSPITAL_COMMUNITY)
Admission: RE | Admit: 2023-05-14 | Discharge: 2023-05-14 | Disposition: A | Discharge status: Home / Self Care | Payer: Medicare HMO | Source: Ambulatory Visit | Attending: Cardiovascular Disease

## 2023-05-14 DIAGNOSIS — Z955 Presence of coronary angioplasty implant and graft: Secondary | ICD-10-CM | POA: Diagnosis not present

## 2023-05-14 NOTE — Progress Notes (Signed)
 Daily Session Note  Patient Details  Name: Elynor Kallenberger MRN: [id number] Date of Birth: 02-07-1951 Referring Provider:   Flowsheet Row CARDIAC REHAB PHASE II ORIENTATION from 04/06/2023 in [facility] CARDIAC REHABILITATION  Referring Provider Verne Carrow MD  [Primary Cardiologist: Dr. Theron Arista Nishan]       Encounter Date: 05/14/2023  Check In:  Session Check In - 05/14/23 1315       Check-In   Supervising physician immediately available to respond to emergencies See telemetry face sheet for immediately available MD    Location [facility]    Staff Present Avanell Shackleton BSN, RN;Heather Fredric Mare, BS, Exercise Physiologist;Jessica Luis Llorons Torres, MA, RCEP, CCRP, Dow Adolph, RN, BSN    Virtual Visit No    Medication changes reported     No    Fall or balance concerns reported    No    Tobacco Cessation No Change    Warm-up and Cool-down Performed on first and last piece of equipment    Resistance Training Performed Yes    VAD Patient? No    PAD/SET Patient? No      Pain Assessment   Currently in Pain? No/denies    Multiple Pain Sites No             Capillary Blood Glucose: No results found for this or any previous visit (from the past 24 hours).    Social History   Tobacco Use  Smoking Status Never   Passive exposure: Never  Smokeless Tobacco Not on file    Goals Met:  Independence with exercise equipment Exercise tolerated well No report of concerns or symptoms today Strength training completed today  Goals Unmet:  Not Applicable  Comments: Marland KitchenMarland KitchenPt able to follow exercise prescription today without complaint.  Will continue to monitor for progression.

## 2023-05-15 ENCOUNTER — Other Ambulatory Visit (HOSPITAL_COMMUNITY): Payer: Self-pay

## 2023-05-19 ENCOUNTER — Encounter (HOSPITAL_COMMUNITY)
Admission: RE | Admit: 2023-05-19 | Discharge: 2023-05-19 | Disposition: A | Discharge status: Home / Self Care | Payer: Medicare HMO | Source: Ambulatory Visit | Attending: Cardiovascular Disease | Admitting: Cardiovascular Disease

## 2023-05-19 DIAGNOSIS — Z955 Presence of coronary angioplasty implant and graft: Secondary | ICD-10-CM

## 2023-05-19 NOTE — Progress Notes (Signed)
 Daily Session Note  Patient Details  Name: Cheryl [name] MRN: [id number] Date of Birth: 1950-12-07 Referring Provider:   Flowsheet Row CARDIAC REHAB PHASE II ORIENTATION from 04/06/2023 in [facility] CARDIAC REHABILITATION  Referring Provider Verne Carrow MD  [Primary Cardiologist: Dr. Theron Arista Nishan]       Encounter Date: 05/19/2023  Check In:  Session Check In - 05/19/23 1400       Check-In   Supervising physician immediately available to respond to emergencies See telemetry face sheet for immediately available MD    Location [facility]    Staff Present Fabio Pierce, MA, RCEP, CCRP, CCET;Heather Fredric Mare, BS, Exercise Physiologist;Phyllis Billingsley, RN;Brittany Roseanne Reno, BSN, RN, WTA-C    Virtual Visit No    Medication changes reported     No    Fall or balance concerns reported    No    Warm-up and Cool-down Performed on first and last piece of equipment    Resistance Training Performed Yes    VAD Patient? No    PAD/SET Patient? No      Pain Assessment   Currently in Pain? No/denies             Capillary Blood Glucose: No results found for this or any previous visit (from the past 24 hours).    Social History   Tobacco Use  Smoking Status Never   Passive exposure: Never  Smokeless Tobacco Not on file    Goals Met:  Independence with exercise equipment Exercise tolerated well No report of concerns or symptoms today Strength training completed today  Goals Unmet:  Not Applicable  Comments: Pt able to follow exercise prescription today without complaint.  Will continue to monitor for progression.

## 2023-05-20 ENCOUNTER — Encounter (HOSPITAL_COMMUNITY): Payer: Self-pay | Admitting: *Deleted

## 2023-05-20 DIAGNOSIS — Z955 Presence of coronary angioplasty implant and graft: Secondary | ICD-10-CM

## 2023-05-20 NOTE — Progress Notes (Signed)
 Cardiac Individual Treatment Plan  Patient Details  Name: Cheryl [name] MRN: [id number] Date of Birth: [birth year] Referring Provider:   Flowsheet Row CARDIAC REHAB PHASE II ORIENTATION from 04/06/2023 in [facility] CARDIAC REHABILITATION  Referring Provider Verne Carrow MD  [Primary Cardiologist: Dr. Theron Arista Nishan]       Initial Encounter Date:  Flowsheet Row CARDIAC REHAB PHASE II ORIENTATION from 04/06/2023 in [place] CARDIAC REHABILITATION  Date 04/06/23       Visit Diagnosis: Status post coronary artery stent placement  Patient's Home Medications on Admission:  Current Outpatient Medications:    alendronate (FOSAMAX) 70 MG tablet, Take 70 mg by mouth once a week. Take with a full glass of water on an empty stomach., Disp: , Rfl:    amLODipine (NORVASC) 5 MG tablet, Take 1 tablet (5 mg total) by mouth daily., Disp: 30 tablet, Rfl: 11   Apoaequorin (PREVAGEN PO), Take 1 tablet by mouth daily., Disp: , Rfl:    aspirin EC 81 MG tablet, Take 1 tablet (81 mg total) by mouth daily. Swallow whole., Disp: 90 tablet, Rfl: 2   atorvastatin (LIPITOR) 80 MG tablet, Take 1 tablet (80 mg total) by mouth daily., Disp: 30 tablet, Rfl: 11   bimatoprost (LUMIGAN) 0.01 % SOLN, Place 1 drop into both eyes at bedtime., Disp: , Rfl:    BIOTIN PO, Take 1 tablet by mouth daily., Disp: , Rfl:    budesonide-formoterol (SYMBICORT) 80-4.5 MCG/ACT inhaler, Inhale 2 puffs into the lungs daily as needed (for shortness of breath)., Disp: , Rfl:    clopidogrel (PLAVIX) 75 MG tablet, Take 1 tablet (75 mg total) by mouth daily., Disp: 90 tablet, Rfl: 2   nitroGLYCERIN (NITROSTAT) 0.4 MG SL tablet, Place 1 tablet (0.4 mg total) under the tongue every 5 (five) minutes as needed., Disp: 25 tablet, Rfl: 2   Omega-3 Fatty Acids (FISH OIL PO), Take 1 capsule by mouth daily., Disp: , Rfl:    potassium chloride SA (KLOR-CON M20) 20 MEQ tablet, Take 1 tablet (20 mEq total) by mouth daily., Disp: 90 tablet, Rfl:  1   spironolactone (ALDACTONE) 100 MG tablet, Take 0.5 tablets (50 mg total) by mouth daily., Disp: , Rfl:    SYNTHROID 50 MCG tablet, Take 50 mcg by mouth every morning., Disp: , Rfl:    thiamine (VITAMIN B1) 100 MG tablet, Take 1 tablet (100 mg total) by mouth daily., Disp: 90 tablet, Rfl: 1  Past Medical History: Past Medical History:  Diagnosis Date   AAA (abdominal aortic aneurysm) (HCC)    Arthritis    osteoarthritis   Hyperlipidemia    Hypertension    Hypoaldosteronism (HCC)    Renal disorder    renal insufficiency   Sarcoidosis    Thyroid disease    hypothyroidism    Tobacco Use: Social History   Tobacco Use  Smoking Status Never   Passive exposure: Never  Smokeless Tobacco Not on file    Labs: Review Flowsheet       Latest Ref Rng & Units 11/26/2020 01/02/2022 10/03/2022  Labs for ITP Cardiac and Pulmonary Rehab  Cholestrol 0 - 200 mg/dL 409     811  914   LDL (calc) 0 - 99 mg/dL 71     54  84   HDL-C >78.29 mg/dL 54     56.21  30.86   Trlycerides 0.0 - 149.0 mg/dL 74     57.8  46.9     Details       This  result is from an external source.         Capillary Blood Glucose: Lab Results  Component Value Date   GLUCAP 136 (H) 03/18/2023   GLUCAP 73 03/18/2023   GLUCAP 89 03/18/2023     Exercise Target Goals: Exercise Program Goal: Individual exercise prescription set using results from initial 6 min walk test and THRR while considering  patient's activity barriers and safety.   Exercise Prescription Goal: Starting with aerobic activity 30 plus minutes a day, 3 days per week for initial exercise prescription. Provide home exercise prescription and guidelines that participant acknowledges understanding prior to discharge.  Activity Barriers & Risk Stratification:  Activity Barriers & Cardiac Risk Stratification - 04/06/23 1318       Activity Barriers & Cardiac Risk Stratification   Activity Barriers Arthritis;Back Problems;Neck/Spine  Problems;Balance Concerns;History of Falls;Deconditioning;Muscular Weakness   on going back pain, dizzy spells since stent   Cardiac Risk Stratification Moderate             6 Minute Walk:  6 Minute Walk     Row Name 04/06/23 1457         6 Minute Walk   Phase Initial     Distance 1050 feet     Walk Time 6 minutes     # of Rest Breaks 0     MPH 1.99     METS 2.54     RPE 13     Perceived Dyspnea  1     VO2 Peak 8.88     Symptoms Yes (comment)     Comments R knee pain 3/10, SOB     Resting HR 59 bpm     Resting BP 112/58     Resting Oxygen Saturation  99 %     Exercise Oxygen Saturation  during 6 min walk 99 %     Max Ex. HR 105 bpm     Max Ex. BP 128/74     2 Minute Post BP 108/56              Oxygen Initial Assessment:   Oxygen Re-Evaluation:   Oxygen Discharge (Final Oxygen Re-Evaluation):   Initial Exercise Prescription:  Initial Exercise Prescription - 04/06/23 1400       Date of Initial Exercise RX and Referring Provider   Date 04/06/23    Referring Provider Verne Carrow MD   Primary Cardiologist: Dr. Charlton Haws     Oxygen   Maintain Oxygen Saturation 88% or higher      Treadmill   MPH 1.7    Grade 0.5    Minutes 15    METs 2.42      NuStep   Level 1    SPM 80    Minutes 15    METs 2      Prescription Details   Frequency (times per week) 2    Duration Progress to 30 minutes of continuous aerobic without signs/symptoms of physical distress      Intensity   THRR 40-80% of Max Heartrate 94-129    Ratings of Perceived Exertion 11-13    Perceived Dyspnea 0-4      Progression   Progression Continue to progress workloads to maintain intensity without signs/symptoms of physical distress.      Resistance Training   Training Prescription Yes    Weight 2 lb    Reps 10-15             Perform Capillary Blood Glucose checks as needed.  Exercise Prescription Changes:   Exercise Prescription Changes     Row Name  04/06/23 1400 04/14/23 1500 04/21/23 1500 04/30/23 1300 05/05/23 1500     Response to Exercise   Blood Pressure (Admit) 112/58 108/60 116/62 108/60 100/60   Blood Pressure (Exercise) 128/74 132/70 110/50 108/50 102/60   Blood Pressure (Exit) 108/56 112/70 118/62 108/62 122/62   Heart Rate (Admit) 59 bpm 90 bpm 82 bpm 77 bpm 72 bpm   Heart Rate (Exercise) 105 bpm 98 bpm 92 bpm 93 bpm 98 bpm   Heart Rate (Exit) 62 bpm 84 bpm 78 bpm 75 bpm 71 bpm   Oxygen Saturation (Admit) 99 % -- -- -- --   Oxygen Saturation (Exercise) 99 % -- -- -- --   Rating of Perceived Exertion (Exercise) 13 13 12 13 13    Perceived Dyspnea (Exercise) 1 1 -- -- --   Symptoms R knee pain 3/10, SOB -- -- -- --   Comments walk test results -- -- -- --   Duration -- Continue with 30 min of aerobic exercise without signs/symptoms of physical distress. Continue with 30 min of aerobic exercise without signs/symptoms of physical distress. Continue with 30 min of aerobic exercise without signs/symptoms of physical distress. Continue with 30 min of aerobic exercise without signs/symptoms of physical distress.   Intensity -- THRR unchanged THRR unchanged THRR unchanged THRR unchanged     Progression   Progression -- Continue to progress workloads to maintain intensity without signs/symptoms of physical distress. Continue to progress workloads to maintain intensity without signs/symptoms of physical distress. Continue to progress workloads to maintain intensity without signs/symptoms of physical distress. Continue to progress workloads to maintain intensity without signs/symptoms of physical distress.     Resistance Training   Training Prescription -- Yes Yes Yes Yes   Weight -- 2 2 2 2    Reps -- 10-15 10-15 10-15 10-15     Treadmill   MPH -- 1.3 1.7 1.8 1.8   Grade -- 0.5 0 0 0   Minutes -- 15 15 15 15    METs -- 2.08 2.3 2.38 2.38     NuStep   Level -- 1 2 3 2    SPM -- 60 62 62 64   Minutes -- 15 15 15 15    METs -- 1.7  1.5 1.7 1.8     Home Exercise Plan   Plans to continue exercise at -- -- -- Home (comment)  walking, bike, pedal machine Home (comment)   Frequency -- -- -- Add 3 additional days to program exercise sessions. Add 3 additional days to program exercise sessions.   Initial Home Exercises Provided -- -- -- 04/30/23 --            Exercise Comments:   Exercise Comments     Row Name 04/14/23 1347           Exercise Comments : First full day of exercise!  Patient was oriented to gym and equipment including functions, settings, policies, and procedures.  Patient's individual exercise prescription and treatment plan were reviewed.  All starting workloads were established based on the results of the 6 minute walk test done at initial orientation visit.  The plan for exercise progression was also introduced and progression will be customized based on patient's performance and goals.                Exercise Goals and Review:   Exercise Goals     Row Name 04/06/23 [phone]  Exercise Goals   Increase Physical Activity Yes       Intervention Provide advice, education, support and counseling about physical activity/exercise needs.;Develop an individualized exercise prescription for aerobic and resistive training based on initial evaluation findings, risk stratification, comorbidities and participant's personal goals.       Expected Outcomes Short Term: Attend rehab on a regular basis to increase amount of physical activity.;Long Term: Add in home exercise to make exercise part of routine and to increase amount of physical activity.;Long Term: Exercising regularly at least 3-5 days a week.       Increase Strength and Stamina Yes       Intervention Provide advice, education, support and counseling about physical activity/exercise needs.;Develop an individualized exercise prescription for aerobic and resistive training based on initial evaluation findings, risk stratification,  comorbidities and participant's personal goals.       Expected Outcomes Short Term: Increase workloads from initial exercise prescription for resistance, speed, and METs.;Short Term: Perform resistance training exercises routinely during rehab and add in resistance training at home;Long Term: Improve cardiorespiratory fitness, muscular endurance and strength as measured by increased METs and functional capacity ( )       Able to understand and use rate of perceived exertion (RPE) scale Yes       Intervention Provide education and explanation on how to use RPE scale       Expected Outcomes Short Term: Able to use RPE daily in rehab to express subjective intensity level;Long Term:  Able to use RPE to guide intensity level when exercising independently       Able to understand and use Dyspnea scale Yes       Intervention Provide education and explanation on how to use Dyspnea scale       Expected Outcomes Short Term: Able to use Dyspnea scale daily in rehab to express subjective sense of shortness of breath during exertion;Long Term: Able to use Dyspnea scale to guide intensity level when exercising independently       Knowledge and understanding of Target Heart Rate Range (THRR) Yes       Intervention Provide education and explanation of THRR including how the numbers were predicted and where they are located for reference       Expected Outcomes Short Term: Able to state/look up THRR;Long Term: Able to use THRR to govern intensity when exercising independently;Short Term: Able to use daily as guideline for intensity in rehab       Able to check pulse independently Yes       Intervention Provide education and demonstration on how to check pulse in carotid and radial arteries.;Review the importance of being able to check your own pulse for safety during independent exercise       Expected Outcomes Short Term: Able to explain why pulse checking is important during independent exercise;Long Term: Able to  check pulse independently and accurately       Understanding of Exercise Prescription Yes       Intervention Provide education, explanation, and written materials on patient's individual exercise prescription       Expected Outcomes Short Term: Able to explain program exercise prescription;Long Term: Able to explain home exercise prescription to exercise independently                Exercise Goals Re-Evaluation :  Exercise Goals Re-Evaluation     Row Name 04/14/23 1347 04/22/23 1358 04/30/23 1355 05/04/23 1244 05/06/23 0831     Exercise Goal Re-Evaluation  Exercise Goals Review Able to understand and use rate of perceived exertion (RPE) scale;Knowledge and understanding of Target Heart Rate Range (THRR);Understanding of Exercise Prescription;Able to understand and use Dyspnea scale Increase Physical Activity;Increase Strength and Stamina;Understanding of Exercise Prescription Increase Physical Activity;Increase Strength and Stamina;Understanding of Exercise Prescription Increase Physical Activity;Increase Strength and Stamina;Understanding of Exercise Prescription Increase Physical Activity;Increase Strength and Stamina;Understanding of Exercise Prescription   Comments Reviewed RPE and dyspnea scale, THR and program prescription with pt today.  Pt voiced understanding and was given a copy of goals to take home. Biana is doing well in rehab and is tolerating exercise well. She has just started the progrream and is getting use to exercising. Will continue to monitor and progress as able. Lily is doing well in rehab.  She is already starting to push herself some.  Reviewed home exercise with pt today.  Pt plans to walk and use bike and pedal machine at home for exercise.  Reviewed THR, pulse, RPE, sign and symptoms, pulse oximetery and when to call 911 or MD.  Also discussed weather considerations and indoor options.  Pt voiced understanding. Hibo is doing well in rehab and is on her  9th visit. --   Expected Outcomes Short: Use RPE daily to regulate intensity.  Long: Follow program prescription in THR. continue to attend rehab Short: Start to add in more exercise at home Long: Conitnue to improve stamina -- --             Discharge Exercise Prescription (Final Exercise Prescription Changes):  Exercise Prescription Changes - 05/05/23 1500       Response to Exercise   Blood Pressure (Admit) 100/60    Blood Pressure (Exercise) 102/60    Blood Pressure (Exit) 122/62    Heart Rate (Admit) 72 bpm    Heart Rate (Exercise) 98 bpm    Heart Rate (Exit) 71 bpm    Rating of Perceived Exertion (Exercise) 13    Duration Continue with 30 min of aerobic exercise without signs/symptoms of physical distress.    Intensity THRR unchanged      Progression   Progression Continue to progress workloads to maintain intensity without signs/symptoms of physical distress.      Resistance Training   Training Prescription Yes    Weight 2    Reps 10-15      Treadmill   MPH 1.8    Grade 0    Minutes 15    METs 2.38      NuStep   Level 2    SPM 64    Minutes 15    METs 1.8      Home Exercise Plan   Plans to continue exercise at Home (comment)    Frequency Add 3 additional days to program exercise sessions.             Nutrition:  Target Goals: Understanding of nutrition guidelines, daily intake of sodium 1500mg , cholesterol 200mg , calories 30% from fat and 7% or less from saturated fats, daily to have 5 or more servings of fruits and vegetables.  Biometrics:  Pre Biometrics - 04/06/23 1500       Pre Biometrics   Height 5\' 4"  (1.626 m)    Weight 124 lb 12.8 oz (56.6 kg)    Waist Circumference 28 inches    Hip Circumference 34 inches    Waist to Hip Ratio 0.82 %    BMI (Calculated) 21.41    Grip Strength 21.5 kg    Single Leg  Stand 7.8 seconds              Nutrition Therapy Plan and Nutrition Goals:  Nutrition Therapy & Goals - 04/06/23 1326        Intervention Plan   Intervention Prescribe, educate and counsel regarding individualized specific dietary modifications aiming towards targeted core components such as weight, hypertension, lipid management, diabetes, heart failure and other comorbidities.;Nutrition handout(s) given to patient.    Expected Outcomes Short Term Goal: Understand basic principles of dietary content, such as calories, fat, sodium, cholesterol and nutrients.;Long Term Goal: Adherence to prescribed nutrition plan.             Nutrition Assessments:  MEDIFICTS Score Key: >=70 Need to make dietary changes  40-70 Heart Healthy Diet <= 40 Therapeutic Level Cholesterol Diet   Picture Your Plate Scores: <09 Unhealthy dietary pattern with much room for improvement. 41-50 Dietary pattern unlikely to meet recommendations for good health and room for improvement. 51-60 More healthful dietary pattern, with some room for improvement.  >60 Healthy dietary pattern, although there may be some specific behaviors that could be improved.    Nutrition Goals Re-Evaluation:  Nutrition Goals Re-Evaluation     Row Name 04/30/23 1400             Goals   Nutrition Goal Eat enough       Comment Charissa is doing well in rehab.  She feels that she is not eating enough.  She does not have much appetite.  We talked about mechanical eating, which she has been trying to do.  We also talked about trying out portein and meal replacement shakes to supplement.  She is concerned that she is losing weight. We talked about the importance of getting enough protein as well to get in extra calories.       Expected Outcome Short: Try meal replacement shakes Long: conitnue to increase intake                Nutrition Goals Discharge (Final Nutrition Goals Re-Evaluation):  Nutrition Goals Re-Evaluation - 04/30/23 1400       Goals   Nutrition Goal Eat enough    Comment Magenta is doing well in rehab.  She feels that she is not  eating enough.  She does not have much appetite.  We talked about mechanical eating, which she has been trying to do.  We also talked about trying out portein and meal replacement shakes to supplement.  She is concerned that she is losing weight. We talked about the importance of getting enough protein as well to get in extra calories.    Expected Outcome Short: Try meal replacement shakes Long: conitnue to increase intake             Psychosocial: Target Goals: Acknowledge presence or absence of significant depression and/or stress, maximize coping skills, provide positive support system. Participant is able to verbalize types and ability to use techniques and skills needed for reducing stress and depression.  Initial Review & Psychosocial Screening:  Initial Psych Review & Screening - 04/06/23 1324       Initial Review   Current issues with Current Stress Concerns;Current Depression    Source of Stress Concerns Unable to participate in former interests or hobbies;Unable to perform yard/household activities;Chronic Illness    Comments no appetite and not eating thus has loss of energy, not getting enough protein, dizziness and fatigue causing her not to be able to do things  Family Dynamics   Good Support System? Yes   husband, sisters, has son as well she can call on     Barriers   Psychosocial barriers to participate in program Psychosocial barriers identified (see note);The patient should benefit from training in stress management and relaxation.      Screening Interventions   Interventions Encouraged to exercise;To provide support and resources with identified psychosocial needs;Provide feedback about the scores to participant    Expected Outcomes Short Term goal: Utilizing psychosocial counselor, staff and physician to assist with identification of specific Stressors or current issues interfering with healing process. Setting desired goal for each stressor or current issue  identified.;Long Term Goal: Stressors or current issues are controlled or eliminated.;Short Term goal: Identification and review with participant of any Quality of Life or Depression concerns found by scoring the questionnaire.;Long Term goal: The participant improves quality of Life and PHQ9 Scores as seen by post scores and/or verbalization of changes             Quality of Life Scores:  Quality of Life - 04/14/23 1554       Quality of Life   Select Quality of Life      Quality of Life Scores   Health/Function Pre 28.23 %    Socioeconomic Pre 28.21 %    Psych/Spiritual Pre 27.43 %    Family Pre 28.8 %    GLOBAL Pre 26.82 %            Scores of 19 and below usually indicate a poorer quality of life in these areas.  A difference of  2-3 points is a clinically meaningful difference.  A difference of 2-3 points in the total score of the Quality of Life Index has been associated with significant improvement in overall quality of life, self-image, physical symptoms, and general health in studies assessing change in quality of life.  PHQ-9: Review Flowsheet  More data exists      04/06/2023 10/10/2022 10/03/2022 07/31/2022 07/29/2021  Depression screen PHQ 2/9  Decreased Interest 1 1 1  0 0 0  Down, Depressed, Hopeless 1 0 0 0 0 0  PHQ - 2 Score 2 1 1  0 0 0  Altered sleeping 0 - - 0 -  Tired, decreased energy 3 - - 0 -  Change in appetite 3 - - 0 -  Feeling bad or failure about yourself  0 - - 0 -  Trouble concentrating 0 - - 0 -  Moving slowly or fidgety/restless 0 - - 0 -  Suicidal thoughts 0 - - 0 -  PHQ-9 Score 8 - - 0 -  Difficult doing work/chores Very difficult - - - -    Details       Multiple values from one day are sorted in reverse-chronological order        Interpretation of Total Score  Total Score Depression Severity:  1-4 = Minimal depression, 5-9 = Mild depression, 10-14 = Moderate depression, 15-19 = Moderately severe depression, 20-27 = Severe depression    Psychosocial Evaluation and Intervention:  Psychosocial Evaluation - 04/06/23 1500       Psychosocial Evaluation & Interventions   Interventions Stress management education;Encouraged to exercise with the program and follow exercise prescription    Comments Guenevere is coming into cardiac rehab after a stent to her LAD.  Since her stent she has been having several dizzy spells and dealing with extreme fatigue which have prevented her from being able to do everything that she  wants to do.  She is eager to feel better.  She is concerned that she will not be able to do all of the exercise.  She was pleased with how well she did today and not needing to stop during her walk test.  She was assured that she could take rest breaks as needed and that we would only compare her to herself and not others in her class.  She does also have a history of sarcoidosis and renal disease.  She wants to be able to go and do as she pleases and not get tired in the grocery store. We talked about how exercise can help her improve her strength and stamina.  Her PHQ did note some depression and she admitted to having some down days, but felt that it does not last, but last few weeks have definitely be tough on her.  She usually sleeps well.  She has a good support syster in her husband and sister and will call her son when needed.  She also attends church every Sunday.  Her doctors feel that her dizziness and fatigue may be coming from her loss of appetite as she is not eating and has lost weight.  She was encouraged to make sure she is eating healthy and eating enough protein to maintain her weight and normal activities.  She was open to continuing to work on her diet and trying to eat more.    Expected Outcomes Short: Attend rehab to build up stamina Long: Be able to go shopping like she enjoys    Continue Psychosocial Services  Follow up required by staff             Psychosocial Re-Evaluation:  Psychosocial  Re-Evaluation     Row Name 04/30/23 1357             Psychosocial Re-Evaluation   Current issues with Current Stress Concerns       Comments Jazelyn is doing well in rehab.  She is feeling good mentally and denies any major stressors currently.  Her dizzy spells have gotten a little bit better they are now just in the morning.  She has talked to doctor about them, but nothing improved yet.  She is sleeping pretty good most nights and usually gets about 6 hours.       Expected Outcomes Short: Continue to work on dizzy spells  Long: Continue to focus on the postives       Interventions Encouraged to attend Cardiac Rehabilitation for the exercise       Continue Psychosocial Services  Follow up required by staff                Psychosocial Discharge (Final Psychosocial Re-Evaluation):  Psychosocial Re-Evaluation - 04/30/23 1357       Psychosocial Re-Evaluation   Current issues with Current Stress Concerns    Comments Reality is doing well in rehab.  She is feeling good mentally and denies any major stressors currently.  Her dizzy spells have gotten a little bit better they are now just in the morning.  She has talked to doctor about them, but nothing improved yet.  She is sleeping pretty good most nights and usually gets about 6 hours.    Expected Outcomes Short: Continue to work on dizzy spells  Long: Continue to focus on the postives    Interventions Encouraged to attend Cardiac Rehabilitation for the exercise    Continue Psychosocial Services  Follow up required by staff  Vocational Rehabilitation: Provide vocational rehab assistance to qualifying candidates.   Vocational Rehab Evaluation & Intervention:  Vocational Rehab - 04/06/23 1319       Initial Vocational Rehab Evaluation & Intervention   Assessment shows need for Vocational Rehabilitation No   retired            Education: Education Goals: Education classes will be provided on a weekly  basis, covering required topics. Participant will state understanding/return demonstration of topics presented.  Learning Barriers/Preferences:  Learning Barriers/Preferences - 04/06/23 1327       Learning Barriers/Preferences   Learning Barriers Sight   glasses for reading   Learning Preferences Skilled Demonstration;Written Material             Education Topics: Hypertension, Hypertension Reduction -Define heart disease and high blood pressure. Discus how high blood pressure affects the body and ways to reduce high blood pressure.   Exercise and Your Heart -Discuss why it is important to exercise, the FITT principles of exercise, normal and abnormal responses to exercise, and how to exercise safely.   Angina -Discuss definition of angina, causes of angina, treatment of angina, and how to decrease risk of having angina.   Cardiac Medications -Review what the following cardiac medications are used for, how they affect the body, and side effects that may occur when taking the medications.  Medications include Aspirin, Beta blockers, calcium channel blockers, ACE Inhibitors, angiotensin receptor blockers, diuretics, digoxin, and antihyperlipidemics.   Congestive Heart Failure -Discuss the definition of CHF, how to live with CHF, the signs and symptoms of CHF, and how keep track of weight and sodium intake. Flowsheet Row CARDIAC REHAB PHASE II EXERCISE from 05/14/2023 in [place] CARDIAC REHABILITATION  Date 05/14/23  Educator [facility]  Instruction Review Code 1- Verbalizes Understanding       Heart Disease and Intimacy -Discus the effect sexual activity has on the heart, how changes occur during intimacy as we age, and safety during sexual activity.   Smoking Cessation / COPD -Discuss different methods to quit smoking, the health benefits of quitting smoking, and the definition of COPD.   Nutrition I: Fats -Discuss the types of cholesterol, what cholesterol does to the  heart, and how cholesterol levels can be controlled.   Nutrition II: Labels -Discuss the different components of food labels and how to read food label   Heart Parts/Heart Disease and PAD -Discuss the anatomy of the heart, the pathway of blood circulation through the heart, and these are affected by heart disease.   Stress I: Signs and Symptoms -Discuss the causes of stress, how stress may lead to anxiety and depression, and ways to limit stress. Flowsheet Row CARDIAC REHAB PHASE II EXERCISE from 05/14/2023 in [place] CARDIAC REHABILITATION  Date 05/07/23  Educator DJ  Instruction Review Code 1- Verbalizes Understanding       Stress II: Relaxation -Discuss different types of relaxation techniques to limit stress. Flowsheet Row CARDIAC REHAB PHASE II EXERCISE from 05/14/2023 in [place] CARDIAC REHABILITATION  Date 04/23/23  Educator [facility]  Instruction Review Code 1- Verbalizes Understanding       Warning Signs of Stroke / TIA -Discuss definition of a stroke, what the signs and symptoms are of a stroke, and how to identify when someone is having stroke.   Knowledge Questionnaire Score:  Knowledge Questionnaire Score - 04/14/23 1554       Knowledge Questionnaire Score   Pre Score 20/24  Core Components/Risk Factors/Patient Goals at Admission:  Personal Goals and Risk Factors at Admission - 04/06/23 1559       Core Components/Risk Factors/Patient Goals on Admission    Weight Management Yes;Weight Gain;Weight Maintenance    Intervention Weight Management: Develop a combined nutrition and exercise program designed to reach desired caloric intake, while maintaining appropriate intake of nutrient and fiber, sodium and fats, and appropriate energy expenditure required for the weight goal.;Weight Management: Provide education and appropriate resources to help participant work on and attain dietary goals.    Admit Weight 124 lb 12.8 oz (56.6 kg)    Goal  Weight: Short Term 126 lb (57.2 kg)    Goal Weight: Long Term 126 lb (57.2 kg)    Expected Outcomes Short Term: Continue to assess and modify interventions until short term weight is achieved;Long Term: Adherence to nutrition and physical activity/exercise program aimed toward attainment of established weight goal;Weight Maintenance: Understanding of the daily nutrition guidelines, which includes 25-35% calories from fat, 7% or less cal from saturated fats, less than 200mg  cholesterol, less than 1.5gm of sodium, & 5 or more servings of fruits and vegetables daily;Weight Gain: Understanding of general recommendations for a high calorie, high protein meal plan that promotes weight gain by distributing calorie intake throughout the day with the consumption for 4-5 meals, snacks, and/or supplements    Hypertension Yes    Intervention Provide education on lifestyle modifcations including regular physical activity/exercise, weight management, moderate sodium restriction and increased consumption of fresh fruit, vegetables, and low fat dairy, alcohol moderation, and smoking cessation.;Monitor prescription use compliance.    Expected Outcomes Short Term: Continued assessment and intervention until BP is < 140/72mm HG in hypertensive participants. < 130/3mm HG in hypertensive participants with diabetes, heart failure or chronic kidney disease.;Long Term: Maintenance of blood pressure at goal levels.    Lipids Yes    Intervention Provide education and support for participant on nutrition & aerobic/resistive exercise along with prescribed medications to achieve LDL 70mg , HDL >40mg .    Expected Outcomes Short Term: Participant states understanding of desired cholesterol values and is compliant with medications prescribed. Participant is following exercise prescription and nutrition guidelines.;Long Term: Cholesterol controlled with medications as prescribed, with individualized exercise RX and with personalized  nutrition plan. Value goals: LDL < 70mg , HDL > 40 mg.             Core Components/Risk Factors/Patient Goals Review:   Goals and Risk Factor Review     Row Name 04/30/23 1402             Core Components/Risk Factors/Patient Goals Review   Personal Goals Review Weight Management/Obesity;Hypertension;Lipids       Review Iana is doing well in rehab.  She is concerned about losing weight.  She does not have much appetite currently.  We talked about mechaincal eating and trying protein shakes to supplement.  She wants to maintain her strength.  Her blood pressure are doing well and she checks them at home.  Her only concern with her medications is her lack of appetite. She also had a call with a pharmacist to review meds earlier this week.       Expected Outcomes Short: Work on supplementing diet to gain weight back Long; continue to monitor risk factors.                Core Components/Risk Factors/Patient Goals at Discharge (Final Review):   Goals and Risk Factor Review - 04/30/23 1402  Core Components/Risk Factors/Patient Goals Review   Personal Goals Review Weight Management/Obesity;Hypertension;Lipids    Review Kariel is doing well in rehab.  She is concerned about losing weight.  She does not have much appetite currently.  We talked about mechaincal eating and trying protein shakes to supplement.  She wants to maintain her strength.  Her blood pressure are doing well and she checks them at home.  Her only concern with her medications is her lack of appetite. She also had a call with a pharmacist to review meds earlier this week.    Expected Outcomes Short: Work on supplementing diet to gain weight back Long; continue to monitor risk factors.             ITP Comments:  ITP Comments     Row Name 04/06/23 1451 04/14/23 1347 04/22/23 1357 05/20/23 1154     ITP Comments Patient attend orientation today.  Patient is attending Cardiac Rehabilitation Program.   Documentation for diagnosis can be found in 03/18/23.  Reviewed medical chart, RPE/RPD, gym safety, and program guidelines.  Patient was fitted to equipment they will be using during rehab.  Patient is scheduled to start exercise on Tuesday 04/14/23 at 1330.   Initial ITP created and sent for review and signature by Dr. Dina Rich, Medical Director for Cardiac Rehabilitation Program. : First full day of exercise!  Patient was oriented to gym and equipment including functions, settings, policies, and procedures.  Patient's individual exercise prescription and treatment plan were reviewed.  All starting workloads were established based on the results of the 6 minute walk test done at initial orientation visit.  The plan for exercise progression was also introduced and progression will be customized based on patient's performance and goals. 30 day review completed. ITP sent to Dr. Dina Rich, Medical Director of Cardiac Rehab. Continue with ITP unless changes are made by physician. Still new to program. 30 day review completed. ITP sent to Dr. Dina Rich, Medical Director of Cardiac Rehab. Continue with ITP unless changes are made by physician.             Comments: 30 day review

## 2023-05-21 ENCOUNTER — Encounter (HOSPITAL_COMMUNITY)
Admission: RE | Admit: 2023-05-21 | Discharge: 2023-05-21 | Disposition: A | Discharge status: Home / Self Care | Payer: Medicare HMO | Source: Ambulatory Visit | Attending: Cardiovascular Disease | Admitting: Cardiovascular Disease

## 2023-05-21 DIAGNOSIS — Z955 Presence of coronary angioplasty implant and graft: Secondary | ICD-10-CM

## 2023-05-21 NOTE — Progress Notes (Signed)
 Daily Session Note  Patient Details  Name: Cheryl [name] MRN: [id number] Date of Birth: 01/02/1951 Referring Provider:   Flowsheet Row CARDIAC REHAB PHASE II ORIENTATION from 04/06/2023 in [facility] CARDIAC REHABILITATION  Referring Provider Verne Carrow MD  [Primary Cardiologist: Dr. Theron Arista Nishan]       Encounter Date: 05/21/2023  Check In:  Session Check In - 05/21/23 1330       Check-In   Supervising physician immediately available to respond to emergencies See telemetry face sheet for immediately available MD    Location [facility]    Staff Present Ross Ludwig, BS, Exercise Physiologist;Hillary Leonidas Romberg BSN, RN    Virtual Visit No    Medication changes reported     No    Fall or balance concerns reported    No    Tobacco Cessation No Change    Warm-up and Cool-down Performed on first and last piece of equipment    Resistance Training Performed Yes    VAD Patient? No    PAD/SET Patient? No      Pain Assessment   Currently in Pain? No/denies    Multiple Pain Sites No             Capillary Blood Glucose: No results found for this or any previous visit (from the past 24 hours).    Social History   Tobacco Use  Smoking Status Never   Passive exposure: Never  Smokeless Tobacco Not on file    Goals Met:  Independence with exercise equipment Exercise tolerated well No report of concerns or symptoms today Strength training completed today  Goals Unmet:  Not Applicable  Comments: Pt able to follow exercise prescription today without complaint.  Will continue to monitor for progression.

## 2023-05-22 ENCOUNTER — Other Ambulatory Visit (HOSPITAL_COMMUNITY): Payer: Self-pay

## 2023-05-26 ENCOUNTER — Encounter (HOSPITAL_COMMUNITY)
Admission: RE | Admit: 2023-05-26 | Discharge: 2023-05-26 | Disposition: A | Discharge status: Home / Self Care | Payer: Medicare HMO | Source: Ambulatory Visit | Attending: Cardiovascular Disease | Admitting: Cardiovascular Disease

## 2023-05-26 ENCOUNTER — Other Ambulatory Visit (HOSPITAL_COMMUNITY): Payer: Self-pay

## 2023-05-26 DIAGNOSIS — Z955 Presence of coronary angioplasty implant and graft: Secondary | ICD-10-CM | POA: Insufficient documentation

## 2023-05-26 NOTE — Progress Notes (Signed)
 Daily Session Note  Patient Details  Name: Cheryl [name] MRN: [id number] Date of Birth: [birth year] Referring Provider:   Flowsheet Row CARDIAC REHAB PHASE II ORIENTATION from 04/06/2023 in [facility] CARDIAC REHABILITATION  Referring Provider Verne Carrow MD  [Primary Cardiologist: Dr. Theron Arista Nishan]       Encounter Date: 05/26/2023  Check In:  Session Check In - 05/26/23 1334       Check-In   Supervising physician immediately available to respond to emergencies See telemetry face sheet for immediately available MD    Location [facility]    Staff Present Sherrye Payor, RN;Jessica Gentry, MA, RCEP, CCRP, CCET;Heather Fredric Mare, BS, Exercise Physiologist;Chesley Valls Roseanne Reno, BSN, RN, WTA-C    Virtual Visit No    Medication changes reported     No    Fall or balance concerns reported    No    Tobacco Cessation No Change    Warm-up and Cool-down Performed on first and last piece of equipment    Resistance Training Performed Yes    VAD Patient? No    PAD/SET Patient? No      Pain Assessment   Currently in Pain? No/denies             Capillary Blood Glucose: No results found for this or any previous visit (from the past 24 hours).    Social History   Tobacco Use  Smoking Status Never   Passive exposure: Never  Smokeless Tobacco Not on file    Goals Met:  Independence with exercise equipment No report of concerns or symptoms today Strength training completed today  Goals Unmet:  Not Applicable  Comments: Pt able to follow exercise prescription today without complaint.  Will continue to monitor for progression.

## 2023-05-28 ENCOUNTER — Encounter (HOSPITAL_COMMUNITY)
Admission: RE | Admit: 2023-05-28 | Discharge: 2023-05-28 | Disposition: A | Discharge status: Home / Self Care | Payer: Medicare HMO | Source: Ambulatory Visit | Attending: Cardiovascular Disease | Admitting: Cardiovascular Disease

## 2023-05-28 DIAGNOSIS — Z955 Presence of coronary angioplasty implant and graft: Secondary | ICD-10-CM | POA: Diagnosis not present

## 2023-05-28 NOTE — Progress Notes (Signed)
 Daily Session Note  Patient Details  Name: Cheryl [name] MRN: [id number] Date of Birth: September 21, 1950 Referring Provider:   Flowsheet Row CARDIAC REHAB PHASE II ORIENTATION from 04/06/2023 in [facility] CARDIAC REHABILITATION  Referring Provider Verne Carrow MD  [Primary Cardiologist: Dr. Theron Arista Nishan]       Encounter Date: 05/28/2023  Check In:  Session Check In - 05/28/23 1320       Check-In   Supervising physician immediately available to respond to emergencies See telemetry face sheet for immediately available MD    Location [facility]    Staff Present Avanell Shackleton BSN, RN;Heather Fredric Mare, [place], Exercise Physiologist;Tina Jake Shark, RN    Virtual Visit No    Medication changes reported     No    Fall or balance concerns reported    No    Tobacco Cessation No Change    Warm-up and Cool-down Performed on first and last piece of equipment    Resistance Training Performed Yes    VAD Patient? No    PAD/SET Patient? No      Pain Assessment   Currently in Pain? No/denies    Multiple Pain Sites No             Capillary Blood Glucose: No results found for this or any previous visit (from the past 24 hours).    Social History   Tobacco Use  Smoking Status Never   Passive exposure: Never  Smokeless Tobacco Not on file    Goals Met:  Independence with exercise equipment Exercise tolerated well No report of concerns or symptoms today Strength training completed today  Goals Unmet:  Not Applicable  Comments: .Pt able to follow exercise prescription today without complaint.  Will continue to monitor for progression.

## 2023-06-02 ENCOUNTER — Encounter (HOSPITAL_COMMUNITY)
Admission: RE | Admit: 2023-06-02 | Discharge: 2023-06-02 | Disposition: A | Discharge status: Home / Self Care | Payer: Medicare HMO | Source: Ambulatory Visit | Attending: Cardiovascular Disease

## 2023-06-02 DIAGNOSIS — Z955 Presence of coronary angioplasty implant and graft: Secondary | ICD-10-CM | POA: Diagnosis not present

## 2023-06-02 NOTE — Progress Notes (Signed)
 Daily Session Note  Patient Details  Name: Cheryl [name] MRN: [id number] Date of Birth: October 14, 1950 Referring Provider:   Flowsheet Row CARDIAC REHAB PHASE II ORIENTATION from 04/06/2023 in [facility] CARDIAC REHABILITATION  Referring Provider Verne Carrow MD  [Primary Cardiologist: Dr. Theron Arista Nishan]       Encounter Date: 06/02/2023  Check In:  Session Check In - 06/02/23 1348       Check-In   Supervising physician immediately available to respond to emergencies See telemetry face sheet for immediately available MD    Location [facility]    Staff Present Desiree Lucy, BSN, RN, Sherlyn Hay, MA, RCEP, CCRP, CCET    Virtual Visit No    Medication changes reported     No    Fall or balance concerns reported    No    Tobacco Cessation No Change    Warm-up and Cool-down Performed on first and last piece of equipment    Resistance Training Performed Yes    VAD Patient? No    PAD/SET Patient? No      Pain Assessment   Currently in Pain? No/denies             Capillary Blood Glucose: No results found for this or any previous visit (from the past 24 hours).    Social History   Tobacco Use  Smoking Status Never   Passive exposure: Never  Smokeless Tobacco Not on file    Goals Met:  Independence with exercise equipment Exercise tolerated well No report of concerns or symptoms today Strength training completed today  Goals Unmet:  Not Applicable  Comments: Pt able to follow exercise prescription today without complaint.  Will continue to monitor for progression.

## 2023-06-04 ENCOUNTER — Encounter (HOSPITAL_COMMUNITY)
Admission: RE | Admit: 2023-06-04 | Discharge: 2023-06-04 | Disposition: A | Discharge status: Home / Self Care | Payer: Medicare HMO | Source: Ambulatory Visit | Attending: Cardiovascular Disease | Admitting: Cardiovascular Disease

## 2023-06-04 DIAGNOSIS — Z955 Presence of coronary angioplasty implant and graft: Secondary | ICD-10-CM

## 2023-06-04 NOTE — Progress Notes (Signed)
 Daily Session Note  Patient Details  Name: Cheryl [name] MRN: [id number] Date of Birth: [birth year] Referring Provider:   Flowsheet Row CARDIAC REHAB PHASE II ORIENTATION from 04/06/2023 in [facility] CARDIAC REHABILITATION  Referring Provider Verne Carrow MD  [Primary Cardiologist: Dr. Theron Arista Nishan]       Encounter Date: 06/04/2023  Check In:  Session Check In - 06/04/23 1315       Check-In   Supervising physician immediately available to respond to emergencies See telemetry face sheet for immediately available MD    Location [facility]    Staff Present Avanell Shackleton BSN, RN;Heather Fredric Mare, BS, Exercise Physiologist;Jessica [place], MA, RCEP, CCRP, Dow Adolph, RN, BSN    Virtual Visit No    Medication changes reported     No    Fall or balance concerns reported    No    Tobacco Cessation No Change    Warm-up and Cool-down Performed on first and last piece of equipment    Resistance Training Performed Yes    VAD Patient? No    PAD/SET Patient? No      Pain Assessment   Currently in Pain? No/denies    Multiple Pain Sites No             Capillary Blood Glucose: No results found for this or any previous visit (from the past 24 hours).    Social History   Tobacco Use  Smoking Status Never   Passive exposure: Never  Smokeless Tobacco Not on file    Goals Met:  Independence with exercise equipment Exercise tolerated well No report of concerns or symptoms today Strength training completed today  Goals Unmet:  Not Applicable  Comments: Marland KitchenMarland KitchenPt able to follow exercise prescription today without complaint.  Will continue to monitor for progression.

## 2023-06-09 ENCOUNTER — Encounter (HOSPITAL_COMMUNITY)
Admission: RE | Admit: 2023-06-09 | Discharge: 2023-06-09 | Disposition: A | Discharge status: Home / Self Care | Payer: Medicare HMO | Source: Ambulatory Visit | Attending: Cardiovascular Disease | Admitting: Cardiovascular Disease

## 2023-06-09 DIAGNOSIS — Z955 Presence of coronary angioplasty implant and graft: Secondary | ICD-10-CM

## 2023-06-09 NOTE — Progress Notes (Signed)
 Daily Session Note  Patient Details  Name: Donja Tipping MRN: [id number] Date of Birth: 1950/06/02 Referring Provider:   Flowsheet Row CARDIAC REHAB PHASE II ORIENTATION from 04/06/2023 in [facility] CARDIAC REHABILITATION  Referring Provider Antoinette Batman MD  [Primary Cardiologist: Dr. Donata Fryer Nishan]       Encounter Date: 06/09/2023  Check In:  Session Check In - 06/09/23 1330       Check-In   Supervising physician immediately available to respond to emergencies See telemetry face sheet for immediately available MD    Location [facility]    Staff Present Clotilda Danish, BS, Exercise Physiologist;Laureen Rachell Budge, RRT, CPFT;Brittany Annette Barters, BSN, RN, WTA-C;Vincente Asbridge, RN    Virtual Visit No    Medication changes reported     No    Fall or balance concerns reported    No    Warm-up and Cool-down Performed on first and last piece of equipment    Resistance Training Performed Yes    VAD Patient? No    PAD/SET Patient? No      Pain Assessment   Currently in Pain? No/denies    Multiple Pain Sites No             Capillary Blood Glucose: No results found for this or any previous visit (from the past 24 hours).    Social History   Tobacco Use  Smoking Status Never   Passive exposure: Never  Smokeless Tobacco Not on file    Goals Met:  Independence with exercise equipment Exercise tolerated well No report of concerns or symptoms today Strength training completed today  Goals Unmet:  Not Applicable  Comments: Pt able to follow exercise prescription today without complaint.  Will continue to monitor for progression.

## 2023-06-11 ENCOUNTER — Encounter (HOSPITAL_COMMUNITY)
Admission: RE | Admit: 2023-06-11 | Discharge: 2023-06-11 | Disposition: A | Discharge status: Home / Self Care | Payer: Medicare HMO | Source: Ambulatory Visit | Attending: Cardiovascular Disease | Admitting: Cardiovascular Disease

## 2023-06-11 DIAGNOSIS — Z955 Presence of coronary angioplasty implant and graft: Secondary | ICD-10-CM

## 2023-06-11 NOTE — Progress Notes (Signed)
 Daily Session Note  Patient Details  Name: Cheryl [name] MRN: [id number] Date of Birth: [birth year] Referring Provider:   Flowsheet Row CARDIAC REHAB PHASE II ORIENTATION from 04/06/2023 in [facility] CARDIAC REHABILITATION  Referring Provider Antoinette Batman MD  [Primary Cardiologist: Dr. Donata Fryer Nishan]       Encounter Date: 06/11/2023  Check In:  Session Check In - 06/11/23 1330       Check-In   Supervising physician immediately available to respond to emergencies See telemetry face sheet for immediately available MD    Location [facility]    Staff Present Almeda Aris, RN;Other;Hillary Lennie Ra BSN, RN;Jonnette Nuon Toy Freund, BS, Exercise Physiologist    Virtual Visit No    Medication changes reported     No    Fall or balance concerns reported    No    Tobacco Cessation No Change    Warm-up and Cool-down Performed on first and last piece of equipment    Resistance Training Performed Yes    VAD Patient? No    PAD/SET Patient? No      Pain Assessment   Currently in Pain? No/denies    Multiple Pain Sites No             Capillary Blood Glucose: No results found for this or any previous visit (from the past 24 hours).    Social History   Tobacco Use  Smoking Status Never   Passive exposure: Never  Smokeless Tobacco Not on file    Goals Met:  Independence with exercise equipment Exercise tolerated well No report of concerns or symptoms today Strength training completed today  Goals Unmet:  Not Applicable  Comments: Pt able to follow exercise prescription today without complaint.  Will continue to monitor for progression.

## 2023-06-16 ENCOUNTER — Encounter (HOSPITAL_COMMUNITY)
Admission: RE | Admit: 2023-06-16 | Discharge: 2023-06-16 | Disposition: A | Discharge status: Home / Self Care | Payer: Medicare HMO | Source: Ambulatory Visit | Attending: Cardiovascular Disease | Admitting: Cardiovascular Disease

## 2023-06-16 DIAGNOSIS — Z955 Presence of coronary angioplasty implant and graft: Secondary | ICD-10-CM

## 2023-06-16 NOTE — Progress Notes (Signed)
 Daily Session Note  Patient Details  Name: Meshia Rau MRN: [id number] Date of Birth: 1950-11-16 Referring Provider:   Flowsheet Row CARDIAC REHAB PHASE II ORIENTATION from 04/06/2023 in [facility] CARDIAC REHABILITATION  Referring Provider Antoinette Batman MD  [Primary Cardiologist: Dr. Donata Fryer Nishan]       Encounter Date: 06/16/2023  Check In:  Session Check In - 06/16/23 1330       Check-In   Supervising physician immediately available to respond to emergencies See telemetry face sheet for immediately available MD    Location [facility]    Staff Present Clotilda Danish, BS, Exercise Physiologist;Brittany Annette Barters, BSN, RN, WTA-C;Verlyn Dannenberg, RN;Jessica [place], MA, RCEP, CCRP, CCET    Virtual Visit No    Medication changes reported     No    Fall or balance concerns reported    No    Warm-up and Cool-down Performed on first and last piece of equipment    Resistance Training Performed Yes    VAD Patient? No    PAD/SET Patient? No      Pain Assessment   Currently in Pain? No/denies    Multiple Pain Sites No             Capillary Blood Glucose: No results found for this or any previous visit (from the past 24 hours).    Social History   Tobacco Use  Smoking Status Never   Passive exposure: Never  Smokeless Tobacco Not on file    Goals Met:  Independence with exercise equipment Exercise tolerated well No report of concerns or symptoms today Strength training completed today  Goals Unmet:  Not Applicable  Comments: Pt able to follow exercise prescription today without complaint.  Will continue to monitor for progression.

## 2023-06-17 ENCOUNTER — Encounter (HOSPITAL_COMMUNITY): Payer: Self-pay | Admitting: *Deleted

## 2023-06-17 DIAGNOSIS — Z955 Presence of coronary angioplasty implant and graft: Secondary | ICD-10-CM

## 2023-06-17 NOTE — Progress Notes (Signed)
 Cardiac Individual Treatment Plan  Patient Details  Name: Pamlea Finder MRN: [id number] Date of Birth: [birth year] Referring Provider:   Flowsheet Row CARDIAC REHAB PHASE II ORIENTATION from 04/06/2023 in [facility] CARDIAC REHABILITATION  Referring Provider Antoinette Batman MD  [Primary Cardiologist: Dr. Donata Fryer Nishan]       Initial Encounter Date:  Flowsheet Row CARDIAC REHAB PHASE II ORIENTATION from 04/06/2023 in [place] CARDIAC REHABILITATION  Date 04/06/23       Visit Diagnosis: Status post coronary artery stent placement  Patient's Home Medications on Admission:  Current Outpatient Medications:    alendronate (FOSAMAX) 70 MG tablet, Take 70 mg by mouth once a week. Take with a full glass of water on an empty stomach., Disp: , Rfl:    amLODipine  (NORVASC ) 5 MG tablet, Take 1 tablet (5 mg total) by mouth daily., Disp: 30 tablet, Rfl: 11   Apoaequorin (PREVAGEN PO), Take 1 tablet by mouth daily., Disp: , Rfl:    aspirin  EC 81 MG tablet, Take 1 tablet (81 mg total) by mouth daily. Swallow whole., Disp: 90 tablet, Rfl: 2   atorvastatin  (LIPITOR) 80 MG tablet, Take 1 tablet (80 mg total) by mouth daily., Disp: 30 tablet, Rfl: 11   bimatoprost (LUMIGAN) 0.01 % SOLN, Place 1 drop into both eyes at bedtime., Disp: , Rfl:    BIOTIN PO, Take 1 tablet by mouth daily., Disp: , Rfl:    budesonide-formoterol (SYMBICORT) 80-4.5 MCG/ACT inhaler, Inhale 2 puffs into the lungs daily as needed (for shortness of breath)., Disp: , Rfl:    clopidogrel  (PLAVIX ) 75 MG tablet, Take 1 tablet (75 mg total) by mouth daily., Disp: 90 tablet, Rfl: 2   nitroGLYCERIN  (NITROSTAT ) 0.4 MG SL tablet, Place 1 tablet (0.4 mg total) under the tongue every 5 (five) minutes as needed., Disp: 25 tablet, Rfl: 2   Omega-3 Fatty Acids (FISH OIL PO), Take 1 capsule by mouth daily., Disp: , Rfl:    potassium chloride  SA (KLOR-CON  M20) 20 MEQ tablet, Take 1 tablet (20 mEq total) by mouth daily., Disp: 90 tablet, Rfl:  1   spironolactone  (ALDACTONE ) 100 MG tablet, Take 0.5 tablets (50 mg total) by mouth daily., Disp: , Rfl:    SYNTHROID  50 MCG tablet, Take 50 mcg by mouth every morning., Disp: , Rfl:    thiamine  (VITAMIN B1) 100 MG tablet, Take 1 tablet (100 mg total) by mouth daily., Disp: 90 tablet, Rfl: 1  Past Medical History: Past Medical History:  Diagnosis Date   AAA (abdominal aortic aneurysm) (HCC)    Arthritis    osteoarthritis   Hyperlipidemia    Hypertension    Hypoaldosteronism (HCC)    Renal disorder    renal insufficiency   Sarcoidosis    Thyroid  disease    hypothyroidism    Tobacco Use: Social History   Tobacco Use  Smoking Status Never   Passive exposure: Never  Smokeless Tobacco Not on file    Labs: Review Flowsheet       Latest Ref Rng & Units 11/26/2020 01/02/2022 10/03/2022  Labs for ITP Cardiac and Pulmonary Rehab  Cholestrol 0 - 200 mg/dL 409     811  914   LDL (calc) 0 - 99 mg/dL 71     54  84   HDL-C >78.29 mg/dL 54     56.21  30.86   Trlycerides 0.0 - 149.0 mg/dL 74     57.8  46.9     Details       This  result is from an external source.         Capillary Blood Glucose: Lab Results  Component Value Date   GLUCAP 136 (H) 03/18/2023   GLUCAP 73 03/18/2023   GLUCAP 89 03/18/2023     Exercise Target Goals: Exercise Program Goal: Individual exercise prescription set using results from initial 6 min walk test and THRR while considering  patient's activity barriers and safety.   Exercise Prescription Goal: Starting with aerobic activity 30 plus minutes a day, 3 days per week for initial exercise prescription. Provide home exercise prescription and guidelines that participant acknowledges understanding prior to discharge.  Activity Barriers & Risk Stratification:  Activity Barriers & Cardiac Risk Stratification - 04/06/23 1318       Activity Barriers & Cardiac Risk Stratification   Activity Barriers Arthritis;Back Problems;Neck/Spine  Problems;Balance Concerns;History of Falls;Deconditioning;Muscular Weakness   on going back pain, dizzy spells since stent   Cardiac Risk Stratification Moderate             6 Minute Walk:  6 Minute Walk     Row Name 04/06/23 1457         6 Minute Walk   Phase Initial     Distance 1050 feet     Walk Time 6 minutes     # of Rest Breaks 0     MPH 1.99     METS 2.54     RPE 13     Perceived Dyspnea  1     VO2 Peak 8.88     Symptoms Yes (comment)     Comments R knee pain 3/10, SOB     Resting HR 59 bpm     Resting BP 112/58     Resting Oxygen Saturation  99 %     Exercise Oxygen Saturation  during 6 min walk 99 %     Max Ex. HR 105 bpm     Max Ex. BP 128/74     2 Minute Post BP 108/56              Oxygen Initial Assessment:   Oxygen Re-Evaluation:   Oxygen Discharge (Final Oxygen Re-Evaluation):   Initial Exercise Prescription:  Initial Exercise Prescription - 04/06/23 1400       Date of Initial Exercise RX and Referring Provider   Date 04/06/23    Referring Provider Antoinette Batman MD   Primary Cardiologist: Dr. Janelle Mediate     Oxygen   Maintain Oxygen Saturation 88% or higher      Treadmill   MPH 1.7    Grade 0.5    Minutes 15    METs 2.42      NuStep   Level 1    SPM 80    Minutes 15    METs 2      Prescription Details   Frequency (times per week) 2    Duration Progress to 30 minutes of continuous aerobic without signs/symptoms of physical distress      Intensity   THRR 40-80% of Max Heartrate 94-129    Ratings of Perceived Exertion 11-13    Perceived Dyspnea 0-4      Progression   Progression Continue to progress workloads to maintain intensity without signs/symptoms of physical distress.      Resistance Training   Training Prescription Yes    Weight 2 lb    Reps 10-15             Perform Capillary Blood Glucose checks as needed.  Exercise Prescription Changes:   Exercise Prescription Changes     Row Name  04/06/23 1400 04/14/23 1500 04/21/23 1500 04/30/23 1300 05/05/23 1500     Response to Exercise   Blood Pressure (Admit) 112/58 108/60 116/62 108/60 100/60   Blood Pressure (Exercise) 128/74 132/70 110/50 108/50 102/60   Blood Pressure (Exit) 108/56 112/70 118/62 108/62 122/62   Heart Rate (Admit) 59 bpm 90 bpm 82 bpm 77 bpm 72 bpm   Heart Rate (Exercise) 105 bpm 98 bpm 92 bpm 93 bpm 98 bpm   Heart Rate (Exit) 62 bpm 84 bpm 78 bpm 75 bpm 71 bpm   Oxygen Saturation (Admit) 99 % -- -- -- --   Oxygen Saturation (Exercise) 99 % -- -- -- --   Rating of Perceived Exertion (Exercise) 13 13 12 13 13    Perceived Dyspnea (Exercise) 1 1 -- -- --   Symptoms R knee pain 3/10, SOB -- -- -- --   Comments walk test results -- -- -- --   Duration -- Continue with 30 min of aerobic exercise without signs/symptoms of physical distress. Continue with 30 min of aerobic exercise without signs/symptoms of physical distress. Continue with 30 min of aerobic exercise without signs/symptoms of physical distress. Continue with 30 min of aerobic exercise without signs/symptoms of physical distress.   Intensity -- THRR unchanged THRR unchanged THRR unchanged THRR unchanged     Progression   Progression -- Continue to progress workloads to maintain intensity without signs/symptoms of physical distress. Continue to progress workloads to maintain intensity without signs/symptoms of physical distress. Continue to progress workloads to maintain intensity without signs/symptoms of physical distress. Continue to progress workloads to maintain intensity without signs/symptoms of physical distress.     Resistance Training   Training Prescription -- Yes Yes Yes Yes   Weight -- 2 2 2 2    Reps -- 10-15 10-15 10-15 10-15     Treadmill   MPH -- 1.3 1.7 1.8 1.8   Grade -- 0.5 0 0 0   Minutes -- 15 15 15 15    METs -- 2.08 2.3 2.38 2.38     NuStep   Level -- 1 2 3 2    SPM -- 60 62 62 64   Minutes -- 15 15 15 15    METs -- 1.7  1.5 1.7 1.8     Home Exercise Plan   Plans to continue exercise at -- -- -- Home (comment)  walking, bike, pedal machine Home (comment)   Frequency -- -- -- Add 3 additional days to program exercise sessions. Add 3 additional days to program exercise sessions.   Initial Home Exercises Provided -- -- -- 04/30/23 --    Row Name 05/19/23 1500 06/09/23 1500           Response to Exercise   Blood Pressure (Admit) 108/50 118/64      Blood Pressure (Exit) 100/60 120/60      Heart Rate (Admit) 63 bpm 61 bpm      Heart Rate (Exercise) 92 bpm 96 bpm      Heart Rate (Exit) 65 bpm 70 bpm      Rating of Perceived Exertion (Exercise) 13 13      Duration Continue with 30 min of aerobic exercise without signs/symptoms of physical distress. Continue with 30 min of aerobic exercise without signs/symptoms of physical distress.      Intensity THRR unchanged THRR unchanged        Progression   Progression Continue to progress  workloads to maintain intensity without signs/symptoms of physical distress. Continue to progress workloads to maintain intensity without signs/symptoms of physical distress.        Resistance Training   Training Prescription Yes Yes      Weight 2 3      Reps 10-15 10-15        Treadmill   MPH 2 2      Grade 0 0      Minutes 15 15      METs 2.53 2.53        NuStep   Level 2 2      SPM 76 80      Minutes 15 15      METs 2 2.1        Home Exercise Plan   Plans to continue exercise at Home (comment) Home (comment)      Frequency Add 3 additional days to program exercise sessions. Add 3 additional days to program exercise sessions.               Exercise Comments:   Exercise Comments     Row Name 04/14/23 1347           Exercise Comments : First full day of exercise!  Patient was oriented to gym and equipment including functions, settings, policies, and procedures.  Patient's individual exercise prescription and treatment plan were reviewed.  All starting  workloads were established based on the results of the 6 minute walk test done at initial orientation visit.  The plan for exercise progression was also introduced and progression will be customized based on patient's performance and goals.                Exercise Goals and Review:   Exercise Goals     Row Name 04/06/23 1459             Exercise Goals   Increase Physical Activity Yes       Intervention Provide advice, education, support and counseling about physical activity/exercise needs.;Develop an individualized exercise prescription for aerobic and resistive training based on initial evaluation findings, risk stratification, comorbidities and participant's personal goals.       Expected Outcomes Short Term: Attend rehab on a regular basis to increase amount of physical activity.;Long Term: Add in home exercise to make exercise part of routine and to increase amount of physical activity.;Long Term: Exercising regularly at least 3-5 days a week.       Increase Strength and Stamina Yes       Intervention Provide advice, education, support and counseling about physical activity/exercise needs.;Develop an individualized exercise prescription for aerobic and resistive training based on initial evaluation findings, risk stratification, comorbidities and participant's personal goals.       Expected Outcomes Short Term: Increase workloads from initial exercise prescription for resistance, speed, and METs.;Short Term: Perform resistance training exercises routinely during rehab and add in resistance training at home;Long Term: Improve cardiorespiratory fitness, muscular endurance and strength as measured by increased METs and functional capacity ( )       Able to understand and use rate of perceived exertion (RPE) scale Yes       Intervention Provide education and explanation on how to use RPE scale       Expected Outcomes Short Term: Able to use RPE daily in rehab to express subjective  intensity level;Long Term:  Able to use RPE to guide intensity level when exercising independently       Able to  understand and use Dyspnea scale Yes       Intervention Provide education and explanation on how to use Dyspnea scale       Expected Outcomes Short Term: Able to use Dyspnea scale daily in rehab to express subjective sense of shortness of breath during exertion;Long Term: Able to use Dyspnea scale to guide intensity level when exercising independently       Knowledge and understanding of Target Heart Rate Range (THRR) Yes       Intervention Provide education and explanation of THRR including how the numbers were predicted and where they are located for reference       Expected Outcomes Short Term: Able to state/look up THRR;Long Term: Able to use THRR to govern intensity when exercising independently;Short Term: Able to use daily as guideline for intensity in rehab       Able to check pulse independently Yes       Intervention Provide education and demonstration on how to check pulse in carotid and radial arteries.;Review the importance of being able to check your own pulse for safety during independent exercise       Expected Outcomes Short Term: Able to explain why pulse checking is important during independent exercise;Long Term: Able to check pulse independently and accurately       Understanding of Exercise Prescription Yes       Intervention Provide education, explanation, and written materials on patient's individual exercise prescription       Expected Outcomes Short Term: Able to explain program exercise prescription;Long Term: Able to explain home exercise prescription to exercise independently                Exercise Goals Re-Evaluation :  Exercise Goals Re-Evaluation     Row Name 04/14/23 1347 04/22/23 1358 04/30/23 1355 05/04/23 1244 05/06/23 0831     Exercise Goal Re-Evaluation   Exercise Goals Review Able to understand and use rate of perceived exertion (RPE)  scale;Knowledge and understanding of Target Heart Rate Range (THRR);Understanding of Exercise Prescription;Able to understand and use Dyspnea scale Increase Physical Activity;Increase Strength and Stamina;Understanding of Exercise Prescription Increase Physical Activity;Increase Strength and Stamina;Understanding of Exercise Prescription Increase Physical Activity;Increase Strength and Stamina;Understanding of Exercise Prescription Increase Physical Activity;Increase Strength and Stamina;Understanding of Exercise Prescription   Comments Reviewed RPE and dyspnea scale, THR and program prescription with pt today.  Pt voiced understanding and was given a copy of goals to take home. Jalea is doing well in rehab and is tolerating exercise well. She has just started the progrream and is getting use to exercising. Will continue to monitor and progress as able. Daniqua is doing well in rehab.  She is already starting to push herself some.  Reviewed home exercise with pt today.  Pt plans to walk and use bike and pedal machine at home for exercise.  Reviewed THR, pulse, RPE, sign and symptoms, pulse oximetery and when to call 911 or MD.  Also discussed weather considerations and indoor options.  Pt voiced understanding. Tauheedah is doing well in rehab and is on her 9th visit. --   Expected Outcomes Short: Use RPE daily to regulate intensity.  Long: Follow program prescription in THR. continue to attend rehab Short: Start to add in more exercise at home Long: Conitnue to improve stamina -- --    Row Name 06/04/23 1344             Exercise Goal Re-Evaluation   Exercise Goals Review Increase Physical Activity;Increase Strength and  Stamina;Understanding of Exercise Prescription       Comments Geneve is doing well in rehab.  She is starting to feel stronger and has more stamina.  She has not been using her equipment at home.  She feels tired a lot and feels that way every day. We talked about trying for 5 min  on her bike each day. She has found that being seated to exercise is easier for her to do.       Expected Outcomes SHort; Start to add in 5 min of exercise on her days off Long: Continue to improve stamina                 Discharge Exercise Prescription (Final Exercise Prescription Changes):  Exercise Prescription Changes - 06/09/23 1500       Response to Exercise   Blood Pressure (Admit) 118/64    Blood Pressure (Exit) 120/60    Heart Rate (Admit) 61 bpm    Heart Rate (Exercise) 96 bpm    Heart Rate (Exit) 70 bpm    Rating of Perceived Exertion (Exercise) 13    Duration Continue with 30 min of aerobic exercise without signs/symptoms of physical distress.    Intensity THRR unchanged      Progression   Progression Continue to progress workloads to maintain intensity without signs/symptoms of physical distress.      Resistance Training   Training Prescription Yes    Weight 3    Reps 10-15      Treadmill   MPH 2    Grade 0    Minutes 15    METs 2.53      NuStep   Level 2    SPM 80    Minutes 15    METs 2.1      Home Exercise Plan   Plans to continue exercise at Home (comment)    Frequency Add 3 additional days to program exercise sessions.             Nutrition:  Target Goals: Understanding of nutrition guidelines, daily intake of sodium 1500mg , cholesterol 200mg , calories 30% from fat and 7% or less from saturated fats, daily to have 5 or more servings of fruits and vegetables.  Biometrics:  Pre Biometrics - 04/06/23 1500       Pre Biometrics   Height 5\' 4"  (1.626 m)    Weight 124 lb 12.8 oz (56.6 kg)    Waist Circumference 28 inches    Hip Circumference 34 inches    Waist to Hip Ratio 0.82 %    BMI (Calculated) 21.41    Grip Strength 21.5 kg    Single Leg Stand 7.8 seconds              Nutrition Therapy Plan and Nutrition Goals:  Nutrition Therapy & Goals - 04/06/23 1326       Intervention Plan   Intervention Prescribe, educate and  counsel regarding individualized specific dietary modifications aiming towards targeted core components such as weight, hypertension, lipid management, diabetes, heart failure and other comorbidities.;Nutrition handout(s) given to patient.    Expected Outcomes Short Term Goal: Understand basic principles of dietary content, such as calories, fat, sodium, cholesterol and nutrients.;Long Term Goal: Adherence to prescribed nutrition plan.             Nutrition Assessments:  MEDIFICTS Score Key: >=70 Need to make dietary changes  40-70 Heart Healthy Diet <= 40 Therapeutic Level Cholesterol Diet   Picture Your Plate Scores: <82 Unhealthy dietary  pattern with much room for improvement. 41-50 Dietary pattern unlikely to meet recommendations for good health and room for improvement. 51-60 More healthful dietary pattern, with some room for improvement.  >60 Healthy dietary pattern, although there may be some specific behaviors that could be improved.    Nutrition Goals Re-Evaluation:  Nutrition Goals Re-Evaluation     Row Name 04/30/23 1400 06/04/23 1349           Goals   Nutrition Goal Eat enough Short: Try meal replacement shakes Long: conitnue to increase intake      Comment Allaya is doing well in rehab.  She feels that she is not eating enough.  She does not have much appetite.  We talked about mechanical eating, which she has been trying to do.  We also talked about trying out portein and meal replacement shakes to supplement.  She is concerned that she is losing weight. We talked about the importance of getting enough protein as well to get in extra calories. Peja is doing well in rehab. She has started to eat mroe and get her appetite back some. We talked again about the meal replacement shakes to get in extra calories.  She is open to trying them.  She is wanting to gain weight and muscle.  We talked about making sure to get the protein based ones and talked about where they  are in the store.  She is also is wanting to increase her water intake.      Expected Outcome Short: Try meal replacement shakes Long: conitnue to increase intake Short: Try protein shakes Long; Drink more water               Nutrition Goals Discharge (Final Nutrition Goals Re-Evaluation):  Nutrition Goals Re-Evaluation - 06/04/23 1349       Goals   Nutrition Goal Short: Try meal replacement shakes Long: conitnue to increase intake    Comment Samayra is doing well in rehab. She has started to eat mroe and get her appetite back some. We talked again about the meal replacement shakes to get in extra calories.  She is open to trying them.  She is wanting to gain weight and muscle.  We talked about making sure to get the protein based ones and talked about where they are in the store.  She is also is wanting to increase her water intake.    Expected Outcome Short: Try protein shakes Long; Drink more water             Psychosocial: Target Goals: Acknowledge presence or absence of significant depression and/or stress, maximize coping skills, provide positive support system. Participant is able to verbalize types and ability to use techniques and skills needed for reducing stress and depression.  Initial Review & Psychosocial Screening:  Initial Psych Review & Screening - 04/06/23 1324       Initial Review   Current issues with Current Stress Concerns;Current Depression    Source of Stress Concerns Unable to participate in former interests or hobbies;Unable to perform yard/household activities;Chronic Illness    Comments no appetite and not eating thus has loss of energy, not getting enough protein, dizziness and fatigue causing her not to be able to do things      Family Dynamics   Good Support System? Yes   husband, sisters, has son as well she can call on     Barriers   Psychosocial barriers to participate in program Psychosocial barriers identified (see note);The patient should  benefit from training in stress management and relaxation.      Screening Interventions   Interventions Encouraged to exercise;To provide support and resources with identified psychosocial needs;Provide feedback about the scores to participant    Expected Outcomes Short Term goal: Utilizing psychosocial counselor, staff and physician to assist with identification of specific Stressors or current issues interfering with healing process. Setting desired goal for each stressor or current issue identified.;Long Term Goal: Stressors or current issues are controlled or eliminated.;Short Term goal: Identification and review with participant of any Quality of Life or Depression concerns found by scoring the questionnaire.;Long Term goal: The participant improves quality of Life and PHQ9 Scores as seen by post scores and/or verbalization of changes             Quality of Life Scores:  Quality of Life - 04/14/23 1554       Quality of Life   Select Quality of Life      Quality of Life Scores   Health/Function Pre 28.23 %    Socioeconomic Pre 28.21 %    Psych/Spiritual Pre 27.43 %    Family Pre 28.8 %    GLOBAL Pre 26.82 %            Scores of 19 and below usually indicate a poorer quality of life in these areas.  A difference of  2-3 points is a clinically meaningful difference.  A difference of 2-3 points in the total score of the Quality of Life Index has been associated with significant improvement in overall quality of life, self-image, physical symptoms, and general health in studies assessing change in quality of life.  PHQ-9: Review Flowsheet  More data exists      04/06/2023 10/10/2022 10/03/2022 07/31/2022 07/29/2021  Depression screen PHQ 2/9  Decreased Interest 1 1 1  0 0 0  Down, Depressed, Hopeless 1 0 0 0 0 0  PHQ - 2 Score 2 1 1  0 0 0  Altered sleeping 0 - - 0 -  Tired, decreased energy 3 - - 0 -  Change in appetite 3 - - 0 -  Feeling bad or failure about yourself  0 - - 0 -   Trouble concentrating 0 - - 0 -  Moving slowly or fidgety/restless 0 - - 0 -  Suicidal thoughts 0 - - 0 -  PHQ-9 Score 8 - - 0 -  Difficult doing work/chores Very difficult - - - -    Details       Multiple values from one day are sorted in reverse-chronological order        Interpretation of Total Score  Total Score Depression Severity:  1-4 = Minimal depression, 5-9 = Mild depression, 10-14 = Moderate depression, 15-19 = Moderately severe depression, 20-27 = Severe depression   Psychosocial Evaluation and Intervention:  Psychosocial Evaluation - 04/06/23 1500       Psychosocial Evaluation & Interventions   Interventions Stress management education;Encouraged to exercise with the program and follow exercise prescription    Comments Calli is coming into cardiac rehab after a stent to her LAD.  Since her stent she has been having several dizzy spells and dealing with extreme fatigue which have prevented her from being able to do everything that she wants to do.  She is eager to feel better.  She is concerned that she will not be able to do all of the exercise.  She was pleased with how well she did today and not needing to stop  during her walk test.  She was assured that she could take rest breaks as needed and that we would only compare her to herself and not others in her class.  She does also have a history of sarcoidosis and renal disease.  She wants to be able to go and do as she pleases and not get tired in the grocery store. We talked about how exercise can help her improve her strength and stamina.  Her PHQ did note some depression and she admitted to having some down days, but felt that it does not last, but last few weeks have definitely be tough on her.  She usually sleeps well.  She has a good support syster in her husband and sister and will call her son when needed.  She also attends church every Sunday.  Her doctors feel that her dizziness and fatigue may be coming from  her loss of appetite as she is not eating and has lost weight.  She was encouraged to make sure she is eating healthy and eating enough protein to maintain her weight and normal activities.  She was open to continuing to work on her diet and trying to eat more.    Expected Outcomes Short: Attend rehab to build up stamina Long: Be able to go shopping like she enjoys    Continue Psychosocial Services  Follow up required by staff             Psychosocial Re-Evaluation:  Psychosocial Re-Evaluation     Row Name 04/30/23 1357 06/04/23 1347           Psychosocial Re-Evaluation   Current issues with Current Stress Concerns Current Stress Concerns      Comments Anastyn is doing well in rehab.  She is feeling good mentally and denies any major stressors currently.  Her dizzy spells have gotten a little bit better they are now just in the morning.  She has talked to doctor about them, but nothing improved yet.  She is sleeping pretty good most nights and usually gets about 6 hours. Cloa is doing well in rehab.  She is feeling good mentally.  She was dizzy today, but had a little to drink and it helped.  She is still sleeping well.  She is a night owl but then will sleep late. Her dizzy spells had gotten better until today.  She is also dealing with pollen and AC being on.      Expected Outcomes Short: Continue to work on dizzy spells  Long: Continue to focus on the postives Short: Continue to exercise for mental boost Long: Continue to sleep well.      Interventions Encouraged to attend Cardiac Rehabilitation for the exercise Encouraged to attend Cardiac Rehabilitation for the exercise      Continue Psychosocial Services  Follow up required by staff Follow up required by staff               Psychosocial Discharge (Final Psychosocial Re-Evaluation):  Psychosocial Re-Evaluation - 06/04/23 1347       Psychosocial Re-Evaluation   Current issues with Current Stress Concerns    Comments  Zyanne is doing well in rehab.  She is feeling good mentally.  She was dizzy today, but had a little to drink and it helped.  She is still sleeping well.  She is a night owl but then will sleep late. Her dizzy spells had gotten better until today.  She is also dealing with pollen and AC being on.  Expected Outcomes Short: Continue to exercise for mental boost Long: Continue to sleep well.    Interventions Encouraged to attend Cardiac Rehabilitation for the exercise    Continue Psychosocial Services  Follow up required by staff             Vocational Rehabilitation: Provide vocational rehab assistance to qualifying candidates.   Vocational Rehab Evaluation & Intervention:  Vocational Rehab - 04/06/23 1319       Initial Vocational Rehab Evaluation & Intervention   Assessment shows need for Vocational Rehabilitation No   retired            Education: Education Goals: Education classes will be provided on a weekly basis, covering required topics. Participant will state understanding/return demonstration of topics presented.  Learning Barriers/Preferences:  Learning Barriers/Preferences - 04/06/23 1327       Learning Barriers/Preferences   Learning Barriers Sight   glasses for reading   Learning Preferences Skilled Demonstration;Written Material             Education Topics: Hypertension, Hypertension Reduction -Define heart disease and high blood pressure. Discus how high blood pressure affects the body and ways to reduce high blood pressure.   Exercise and Your Heart -Discuss why it is important to exercise, the FITT principles of exercise, normal and abnormal responses to exercise, and how to exercise safely. Flowsheet Row CARDIAC REHAB PHASE II EXERCISE from 06/11/2023 in [place] CARDIAC REHABILITATION  Date 06/11/23  Educator hb  Instruction Review Code 1- Verbalizes Understanding       Angina -Discuss definition of angina, causes of angina,  treatment of angina, and how to decrease risk of having angina. Flowsheet Row CARDIAC REHAB PHASE II EXERCISE from 06/11/2023 in [place] CARDIAC REHABILITATION  Date 06/04/23  [tests/procedures]  Educator HB  Instruction Review Code 1- Verbalizes Understanding       Cardiac Medications -Review what the following cardiac medications are used for, how they affect the body, and side effects that may occur when taking the medications.  Medications include Aspirin , Beta blockers, calcium  channel blockers, ACE Inhibitors, angiotensin receptor blockers, diuretics, digoxin, and antihyperlipidemics. Flowsheet Row CARDIAC REHAB PHASE II EXERCISE from 06/11/2023 in [place] CARDIAC REHABILITATION  Date 05/21/23  Educator hb  Instruction Review Code 1- Verbalizes Understanding       Congestive Heart Failure -Discuss the definition of CHF, how to live with CHF, the signs and symptoms of CHF, and how keep track of weight and sodium intake. Flowsheet Row CARDIAC REHAB PHASE II EXERCISE from 06/11/2023 in [place] CARDIAC REHABILITATION  Date 05/14/23  Educator [facility]  Instruction Review Code 1- Verbalizes Understanding       Heart Disease and Intimacy -Discus the effect sexual activity has on the heart, how changes occur during intimacy as we age, and safety during sexual activity.   Smoking Cessation / COPD -Discuss different methods to quit smoking, the health benefits of quitting smoking, and the definition of COPD.   Nutrition I: Fats -Discuss the types of cholesterol, what cholesterol does to the heart, and how cholesterol levels can be controlled.   Nutrition II: Labels -Discuss the different components of food labels and how to read food label   Heart Parts/Heart Disease and PAD -Discuss the anatomy of the heart, the pathway of blood circulation through the heart, and these are affected by heart disease.   Stress I: Signs and Symptoms -Discuss the causes of stress, how  stress may lead to anxiety and depression, and ways to  limit stress. Flowsheet Row CARDIAC REHAB PHASE II EXERCISE from 06/11/2023 in [place] CARDIAC REHABILITATION  Date 05/07/23  Educator DJ  Instruction Review Code 1- Verbalizes Understanding       Stress II: Relaxation -Discuss different types of relaxation techniques to limit stress. Flowsheet Row CARDIAC REHAB PHASE II EXERCISE from 06/11/2023 in [place] CARDIAC REHABILITATION  Date 04/23/23  Educator [facility]  Instruction Review Code 1- Verbalizes Understanding       Warning Signs of Stroke / TIA -Discuss definition of a stroke, what the signs and symptoms are of a stroke, and how to identify when someone is having stroke.   Knowledge Questionnaire Score:  Knowledge Questionnaire Score - 04/14/23 1554       Knowledge Questionnaire Score   Pre Score 20/24             Core Components/Risk Factors/Patient Goals at Admission:  Personal Goals and Risk Factors at Admission - 04/06/23 1559       Core Components/Risk Factors/Patient Goals on Admission    Weight Management Yes;Weight Gain;Weight Maintenance    Intervention Weight Management: Develop a combined nutrition and exercise program designed to reach desired caloric intake, while maintaining appropriate intake of nutrient and fiber, sodium and fats, and appropriate energy expenditure required for the weight goal.;Weight Management: Provide education and appropriate resources to help participant work on and attain dietary goals.    Admit Weight 124 lb 12.8 oz (56.6 kg)    Goal Weight: Short Term 126 lb (57.2 kg)    Goal Weight: Long Term 126 lb (57.2 kg)    Expected Outcomes Short Term: Continue to assess and modify interventions until short term weight is achieved;Long Term: Adherence to nutrition and physical activity/exercise program aimed toward attainment of established weight goal;Weight Maintenance: Understanding of the daily nutrition guidelines, which  includes 25-35% calories from fat, 7% or less cal from saturated fats, less than 200mg  cholesterol, less than 1.5gm of sodium, & 5 or more servings of fruits and vegetables daily;Weight Gain: Understanding of general recommendations for a high calorie, high protein meal plan that promotes weight gain by distributing calorie intake throughout the day with the consumption for 4-5 meals, snacks, and/or supplements    Hypertension Yes    Intervention Provide education on lifestyle modifcations including regular physical activity/exercise, weight management, moderate sodium restriction and increased consumption of fresh fruit, vegetables, and low fat dairy, alcohol moderation, and smoking cessation.;Monitor prescription use compliance.    Expected Outcomes Short Term: Continued assessment and intervention until BP is < 140/7mm HG in hypertensive participants. < 130/59mm HG in hypertensive participants with diabetes, heart failure or chronic kidney disease.;Long Term: Maintenance of blood pressure at goal levels.    Lipids Yes    Intervention Provide education and support for participant on nutrition & aerobic/resistive exercise along with prescribed medications to achieve LDL 70mg , HDL >40mg .    Expected Outcomes Short Term: Participant states understanding of desired cholesterol values and is compliant with medications prescribed. Participant is following exercise prescription and nutrition guidelines.;Long Term: Cholesterol controlled with medications as prescribed, with individualized exercise RX and with personalized nutrition plan. Value goals: LDL < 70mg , HDL > 40 mg.             Core Components/Risk Factors/Patient Goals Review:   Goals and Risk Factor Review     Row Name 04/30/23 1402 06/04/23 1354           Core Components/Risk Factors/Patient Goals Review   Personal Goals Review Weight  Management/Obesity;Hypertension;Lipids Weight Management/Obesity;Hypertension;Lipids      Review  Wells is doing well in rehab.  She is concerned about losing weight.  She does not have much appetite currently.  We talked about mechaincal eating and trying protein shakes to supplement.  She wants to maintain her strength.  Her blood pressure are doing well and she checks them at home.  Her only concern with her medications is her lack of appetite. She also had a call with a pharmacist to review meds earlier this week. Jamiee is doing well in rehab.  She is working on gaining well.  She is going to try some protein shakes to help.  Her pressures are doing well overall.  She has had a few low readings but overall doing well.  We talked about water to help with blood pressure control.  She is doing well on her medications overall.  She is meeting with her allergist tomorow.      Expected Outcomes Short: Work on supplementing diet to gain weight back Long; continue to monitor risk factors. Short: Continue to work on gaining weight Long: Continue to keep close eye on her blood pressure               Core Components/Risk Factors/Patient Goals at Discharge (Final Review):   Goals and Risk Factor Review - 06/04/23 1354       Core Components/Risk Factors/Patient Goals Review   Personal Goals Review Weight Management/Obesity;Hypertension;Lipids    Review Liza is doing well in rehab.  She is working on gaining well.  She is going to try some protein shakes to help.  Her pressures are doing well overall.  She has had a few low readings but overall doing well.  We talked about water to help with blood pressure control.  She is doing well on her medications overall.  She is meeting with her allergist tomorow.    Expected Outcomes Short: Continue to work on gaining weight Long: Continue to keep close eye on her blood pressure             ITP Comments:  ITP Comments     Row Name 04/06/23 1451 04/14/23 1347 04/22/23 1357 05/20/23 1154 06/17/23 1457   ITP Comments Patient attend orientation  today.  Patient is attending Cardiac Rehabilitation Program.  Documentation for diagnosis can be found in 03/18/23.  Reviewed medical chart, RPE/RPD, gym safety, and program guidelines.  Patient was fitted to equipment they will be using during rehab.  Patient is scheduled to start exercise on Tuesday 04/14/23 at 1330.   Initial ITP created and sent for review and signature by Dr. Armida Lander, Medical Director for Cardiac Rehabilitation Program. : First full day of exercise!  Patient was oriented to gym and equipment including functions, settings, policies, and procedures.  Patient's individual exercise prescription and treatment plan were reviewed.  All starting workloads were established based on the results of the 6 minute walk test done at initial orientation visit.  The plan for exercise progression was also introduced and progression will be customized based on patient's performance and goals. 30 day review completed. ITP sent to Dr. Armida Lander, Medical Director of Cardiac Rehab. Continue with ITP unless changes are made by physician. Still new to program. 30 day review completed. ITP sent to Dr. Armida Lander, Medical Director of Cardiac Rehab. Continue with ITP unless changes are made by physician. 30 day review completed. ITP sent to Dr. Armida Lander, Medical Director of Cardiac Rehab. Continue with ITP  unless changes are made by physician.            Comments: 30 day review

## 2023-06-18 ENCOUNTER — Encounter (HOSPITAL_COMMUNITY)
Admission: RE | Admit: 2023-06-18 | Discharge: 2023-06-18 | Disposition: A | Discharge status: Home / Self Care | Payer: Medicare HMO | Source: Ambulatory Visit | Attending: Cardiovascular Disease | Admitting: Cardiovascular Disease

## 2023-06-18 DIAGNOSIS — Z955 Presence of coronary angioplasty implant and graft: Secondary | ICD-10-CM | POA: Diagnosis not present

## 2023-06-18 NOTE — Progress Notes (Signed)
 Daily Session Note  Patient Details  Name: Delara Shepheard MRN: [id number] Date of Birth: [birth year] Referring Provider:   Flowsheet Row CARDIAC REHAB PHASE II ORIENTATION from 04/06/2023 in [facility] CARDIAC REHABILIT[facility]  Referring Provider Antoinette Batman MD  [Primary Cardiologist: Dr. Donata Fryer Nishan]       Encounter Date: 06/18/2023  Check In:  Session Check In - 06/18/23 1330       Check-In   Supervising physician immediately available to respond to emergencies See telemetry face sheet for immediately available MD    Location [facility]    Staff Present Clotilda Danish, BS, Exercise Physiologist;Jessica Zoila Hines, MA, RCEP, CCRP, CCET    Virtual Visit No    Medication changes reported     No    Fall or balance concerns reported    No    Tobacco Cessation No Change    Warm-up and Cool-down Performed on first and last piece of equipment    Resistance Training Performed Yes    VAD Patient? No    PAD/SET Patient? No      Pain Assessment   Currently in Pain? No/denies    Multiple Pain Sites No             Capillary Blood Glucose: No results found for this or any previous visit (from the past 24 hours).    Social History   Tobacco Use  Smoking Status Never   Passive exposure: Never  Smokeless Tobacco Not on file    Goals Met:  Independence with exercise equipment Exercise tolerated well No report of concerns or symptoms today Strength training completed today  Goals Unmet:  Not Applicable  Comments: Pt able to follow exercise prescription today without complaint.  Will continue to monitor for progression.

## 2023-06-23 ENCOUNTER — Encounter (HOSPITAL_COMMUNITY)
Admission: RE | Admit: 2023-06-23 | Discharge: 2023-06-23 | Disposition: A | Discharge status: Home / Self Care | Payer: Medicare HMO | Source: Ambulatory Visit | Attending: Cardiovascular Disease

## 2023-06-23 DIAGNOSIS — Z955 Presence of coronary angioplasty implant and graft: Secondary | ICD-10-CM

## 2023-06-23 NOTE — Progress Notes (Signed)
 Daily Session Note  Patient Details  Name: Cheryl [name] MRN: [id number] Date of Birth: Aug 12, 1950 Referring Provider:   Flowsheet Row CARDIAC REHAB PHASE II ORIENTATION from 04/06/2023 in [facility] CARDIAC REHABILITATION  Referring Provider Antoinette Batman MD  [Primary Cardiologist: Dr. Donata Fryer Nishan]       Encounter Date: 06/23/2023  Check In:  Session Check In - 06/23/23 1330       Check-In   Supervising physician immediately available to respond to emergencies See telemetry face sheet for immediately available MD    Location [facility]    Staff Present Clotilda Danish, BS, Exercise Physiologist;Brittany Annette Barters, BSN, RN, Olena Bernard, RN    Virtual Visit No    Medication changes reported     No    Fall or balance concerns reported    No    Warm-up and Cool-down Performed on first and last piece of equipment    Resistance Training Performed Yes    VAD Patient? No    PAD/SET Patient? No      Pain Assessment   Currently in Pain? No/denies             Capillary Blood Glucose: No results found for this or any previous visit (from the past 24 hours).    Social History   Tobacco Use  Smoking Status Never   Passive exposure: Never  Smokeless Tobacco Not on file    Goals Met:  Independence with exercise equipment Exercise tolerated well Strength training completed today  Goals Unmet:  Not Applicable  Comments: Pt able to follow exercise prescription today without complaint.  Will continue to monitor for progression.

## 2023-06-25 ENCOUNTER — Encounter (HOSPITAL_COMMUNITY)
Admission: RE | Admit: 2023-06-25 | Discharge: 2023-06-25 | Disposition: A | Discharge status: Home / Self Care | Payer: Medicare HMO | Source: Ambulatory Visit | Attending: Cardiovascular Disease | Admitting: Cardiovascular Disease

## 2023-06-25 VITALS — Ht 64.0 in | Wt 123.5 lb

## 2023-06-25 DIAGNOSIS — Z955 Presence of coronary angioplasty implant and graft: Secondary | ICD-10-CM | POA: Diagnosis present

## 2023-06-25 NOTE — Progress Notes (Signed)
 Daily Session Note  Patient Details  Name: Lamecia Gajjar MRN: [id number] Date of Birth: 08/07/1950 Referring Provider:   Flowsheet Row CARDIAC REHAB PHASE II ORIENTATION from 04/06/2023 in [facility] CARDIAC REHABILITATION  Referring Provider Antoinette Batman MD  [Primary Cardiologist: Dr. Donata Fryer Nishan]       Encounter Date: 06/25/2023  Check In:  Session Check In - 06/25/23 1330       Check-In   Supervising physician immediately available to respond to emergencies See telemetry face sheet for immediately available MD    Location [facility]    Staff Present Clotilda Danish, BS, Exercise Physiologist;Debra Lincoln Renshaw, RN, BSN    Virtual Visit No    Medication changes reported     No    Fall or balance concerns reported    No    Tobacco Cessation No Change    Warm-up and Cool-down Performed on first and last piece of equipment    Resistance Training Performed Yes    VAD Patient? No    PAD/SET Patient? No      Pain Assessment   Currently in Pain? No/denies    Multiple Pain Sites No             Capillary Blood Glucose: No results found for this or any previous visit (from the past 24 hours).    Social History   Tobacco Use  Smoking Status Never   Passive exposure: Never  Smokeless Tobacco Not on file    Goals Met:  Independence with exercise equipment Exercise tolerated well No report of concerns or symptoms today Strength training completed today  Goals Unmet:  Not Applicable  Comments: Pt able to follow exercise prescription today without complaint.  Will continue to monitor for progression.

## 2023-06-29 NOTE — Progress Notes (Signed)
 " Cardiology Office Note    Patient Name: Cheryl [name] Date of Encounter: 06/29/2023  Primary Care Provider:  Joshua Debby CROME, MD Primary Cardiologist:  Maude Emmer, MD Primary Electrophysiologist: None   Past Medical History    Past Medical History:  Diagnosis Date   AAA (abdominal aortic aneurysm) (HCC)    Arthritis    osteoarthritis   Hyperlipidemia    Hypertension    Hypoaldosteronism (HCC)    Renal disorder    renal insufficiency   Sarcoidosis    Thyroid  disease    hypothyroidism    History of Present Illness  Cheryl [name] is a 73 y.o. female with a PMH of CAD s/p LHC with 90% stenosis mid LAD with DES x 1, HTN, HLD ascending thoracic aneurysm s/p repair 2012, AAA, sarcoidosis, hypoaldosteronism, hypothyroidism who presents for 66-month follow-up.  Cheryl [name] was last seen on 03/27/2023 for post PCI follow-up.  During visit she reported ongoing dizziness and fatigue.  She was advised to increase her hydration eat regular meals to offset dizziness.  She was cleared for cardiac rehab at [facility].  Cheryl [name] presents today for 68-month follow-up.  She reports doing well overall and has been completing cardiac rehab without any difficulty. She experiences ongoing dizziness, particularly when transitioning from sitting to standing, which has persisted since her stent placement in January 2025. She associates the dizziness with her medication regimen, specifically amlodipine , which she takes at a dose of 5 mg daily. Her blood pressure has been stable but occasionally drops. No chest pain, shortness of breath, or swelling in the ankles. She experiences fatigue and has been trying to improve her nutrition by consuming protein drinks. Her appetite remains low, and she struggles to increase her fluid intake, currently consuming about 40 ounces of fluid daily, including water and zero-sugar Gatorade. She is currently participating in cardiac rehabilitation and feels her  endurance has improved. She is taking several medications including spironolactone  50 mg, atorvastatin , clopidogrel , aspirin , and a potassium supplement. She is concerned about the number of medications she is required to take. She mentions a history of balance issues, having attended rehabilitation twice in the past seven to eight years for this problem. She describes difficulty walking in a straight line and feels her balance is currently off. Patient denies chest pain, palpitations, dyspnea, PND, orthopnea, nausea, vomiting, dizziness, syncope, edema, weight gain, or early satiety.  Discussed the use of AI scribe software for clinical note transcription with the patient, who gave verbal consent to proceed.  History of Present Illness   Review of Systems  Please see the history of present illness.    All other systems reviewed and are otherwise negative except as noted above.  Physical Exam    Wt Readings from Last 3 Encounters:  06/25/23 123 lb 8.4 oz (56 kg)  04/22/23 123 lb 12.8 oz (56.2 kg)  04/06/23 124 lb 12.8 oz (56.6 kg)   CD:Uyzmz were no vitals filed for this visit.,There is no height or weight on file to calculate BMI. GEN: Well nourished, well developed in no acute distress Neck: No JVD; No carotid bruits Pulmonary: Clear to auscultation without rales, wheezing or rhonchi  Cardiovascular: Normal rate. Regular rhythm. Normal S1. Normal S2.   Murmurs: There is no murmur.  ABDOMEN: Soft, non-tender, non-distended EXTREMITIES:  No edema; No deformity   EKG/LABS/ Recent Cardiac Studies   ECG personally reviewed by me today -none completed today  Risk Assessment/Calculations:  Lab Results  Component Value Date   WBC 5.8 04/22/2023   HGB 9.6 (L) 04/22/2023   HCT 29.3 (L) 04/22/2023   MCV 96.9 04/22/2023   PLT 229.0 04/22/2023   Lab Results  Component Value Date   CREATININE 1.68 (H) 04/22/2023   BUN 33 (H) 04/22/2023   NA 140 04/22/2023   K 4.5  04/22/2023   CL 108 04/22/2023   CO2 22 04/22/2023   Lab Results  Component Value Date   CHOL 160 10/03/2022   HDL 56.70 10/03/2022   LDLCALC 84 10/03/2022   TRIG 95.0 10/03/2022   CHOLHDL 3 10/03/2022    No results found for: HGBA1C Assessment & Plan   Assessment & Plan   1.  Coronary artery disease: -Patient underwent PET/CT that showed possible LAD infarct with near coronary calcium  in all 3 arteries. -Outpatient LHC was ordered and patient found to have 90% mid LAD stenosis that was heavily calcified, 3% distal RCA, 3% first OM with staged PCI of the LAD following day. -Today patient reports no ongoing chest pain but does report some fatigue and dizziness. -Encouraged increased hydration and regular meals to prevent dizziness. -Continue current medications including Spironolactone  50 mg, Atorvastatin  80 mg, Plavix  75 mg, and Aspirin  81 mg.   2.  Essential hypertension: -Patient's blood pressure today was controlled at 110/68 -Blood pressure well-controlled. Dizziness suggests possible overmedication. -Discussed amlodipine  reduction to mitigate dizziness while maintaining control. - Reduce amlodipine  from 5 mg to 2.5 mg daily. - Monitor blood pressure and symptoms for 30 days. - Reassess medication dosage if symptoms persist or blood pressure increases.   3.  History of descending thoracic aneurysm: -s/p repair by Dr. Lucas in 2012 -Currently followed by CT surgery   4.  Hyperlipidemia: -Patient's last LDL cholesterol was 84 -Continue atorvastatin  80 mg daily   5.  Hypoaldosteronism: -Patient currently on spironolactone  50 mg daily  6. Balance Problem: Reports balance issues with difficulty walking in a straight line. No recent neurological evaluation. Discussed potential evaluation to rule out central causes. - Discuss with primary care provider about potential referral to a neurologist for further evaluation.   Disposition: Follow-up with Maude Emmer, MD or APP  in 6 months   Signed, Wyn Raddle, Jackee Shove, NP 06/29/2023, 8:34 PM [facility] "

## 2023-06-30 ENCOUNTER — Ambulatory Visit: Payer: Medicare HMO | Attending: Nurse Practitioner | Admitting: Nurse Practitioner

## 2023-06-30 ENCOUNTER — Encounter (HOSPITAL_COMMUNITY): Payer: Medicare HMO

## 2023-06-30 ENCOUNTER — Encounter: Payer: Self-pay | Admitting: Nurse Practitioner

## 2023-06-30 VITALS — BP 110/68 | HR 57 | Ht 64.0 in | Wt 122.4 lb

## 2023-06-30 DIAGNOSIS — I10 Essential (primary) hypertension: Secondary | ICD-10-CM | POA: Diagnosis not present

## 2023-06-30 DIAGNOSIS — R2689 Other abnormalities of gait and mobility: Secondary | ICD-10-CM

## 2023-06-30 DIAGNOSIS — I2510 Atherosclerotic heart disease of native coronary artery without angina pectoris: Secondary | ICD-10-CM

## 2023-06-30 DIAGNOSIS — E785 Hyperlipidemia, unspecified: Secondary | ICD-10-CM

## 2023-06-30 DIAGNOSIS — E2740 Unspecified adrenocortical insufficiency: Secondary | ICD-10-CM

## 2023-06-30 DIAGNOSIS — I7123 Aneurysm of the descending thoracic aorta, without rupture: Secondary | ICD-10-CM

## 2023-06-30 MED ORDER — AMLODIPINE BESYLATE 2.5 MG PO TABS: 2.5000 mg | ORAL_TABLET | Freq: Every day | ORAL | 0 refills | Status: DC

## 2023-06-30 NOTE — Patient Instructions (Addendum)
 Medication Instructions:  DECREASE Norvasc  to 2.5mg  Take 1 tablet once a day  ADD A EXTRA BOTTLE OF WATER TO DIET PER DAY *If you need a refill on your cardiac medications before your next appointment, please call your pharmacy*  Lab Work: None Ordered If you have labs (blood work) drawn today and your tests are completely normal, you will receive your results only by: MyChart Message (if you have MyChart) OR A paper copy in the mail If you have any lab test that is abnormal or we need to change your treatment, we will call you to review the results.  Testing/Procedures: None Ordered  Follow-Up: At [facility], you and your health needs are our priority.  As part of our continuing mission to provide you with exceptional heart care, our providers are all part of one team.  This team includes your primary Cardiologist (physician) and Advanced Practice Providers or APPs (Physician Assistants and Nurse Practitioners) who all work together to provide you with the care you need, when you need it.  Your next appointment:   6 month(s)  Provider:   Janelle Mediate, MD    We recommend signing up for the patient portal called "MyChart".  Sign up information is provided on this After Visit Summary.  MyChart is used to connect with patients for Virtual Visits (Telemedicine).  Patients are able to view lab/test results, encounter notes, upcoming appointments, etc.  Non-urgent messages can be sent to your provider as well.   To learn more about what you can do with MyChart, go to [email or web address].   Other Instructions

## 2023-07-01 ENCOUNTER — Encounter: Payer: Self-pay | Admitting: Internal Medicine

## 2023-07-01 ENCOUNTER — Ambulatory Visit (INDEPENDENT_AMBULATORY_CARE_PROVIDER_SITE_OTHER): Admitting: Internal Medicine

## 2023-07-01 ENCOUNTER — Other Ambulatory Visit (HOSPITAL_COMMUNITY): Payer: Self-pay

## 2023-07-01 VITALS — BP 122/58 | HR 57 | Temp 97.4°F | Resp 16 | Ht 64.0 in | Wt 123.4 lb

## 2023-07-01 DIAGNOSIS — R001 Bradycardia, unspecified: Secondary | ICD-10-CM | POA: Diagnosis not present

## 2023-07-01 DIAGNOSIS — I152 Hypertension secondary to endocrine disorders: Secondary | ICD-10-CM

## 2023-07-01 DIAGNOSIS — T452X1A Poisoning by vitamins, accidental (unintentional), initial encounter: Secondary | ICD-10-CM | POA: Insufficient documentation

## 2023-07-01 DIAGNOSIS — T452X4D Poisoning by vitamins, undetermined, subsequent encounter: Secondary | ICD-10-CM

## 2023-07-01 LAB — BASIC METABOLIC PANEL WITH GFR
BUN: 39 mg/dL — ABNORMAL HIGH (ref 6–23)
CO2: 25 meq/L (ref 19–32)
Calcium: 10.6 mg/dL — ABNORMAL HIGH (ref 8.4–10.5)
Chloride: 107 meq/L (ref 96–112)
Creatinine, Ser: 1.69 mg/dL — ABNORMAL HIGH (ref 0.40–1.20)
GFR: 29.44 mL/min — ABNORMAL LOW (ref >60.00)
Glucose, Bld: 95 mg/dL (ref 70–99)
Potassium: 4.7 meq/L (ref 3.5–5.1)
Sodium: 140 meq/L (ref 135–145)

## 2023-07-01 LAB — CBC WITH DIFFERENTIAL/PLATELET
Basophils Absolute: 0 10*3/uL (ref 0.0–0.1)
Basophils Relative: 0.6 % (ref 0.0–3.0)
Eosinophils Absolute: 0.2 10*3/uL (ref 0.0–0.7)
Eosinophils Relative: 2.6 % (ref 0.0–5.0)
HCT: 32.6 % — ABNORMAL LOW (ref 36.0–46.0)
Hemoglobin: 10.7 g/dL — ABNORMAL LOW (ref 12.0–15.0)
Lymphocytes Relative: 27.7 % (ref 12.0–46.0)
Lymphs Abs: 1.7 10*3/uL (ref 0.7–4.0)
MCHC: 32.7 g/dL (ref 30.0–36.0)
MCV: 95.5 fl (ref 78.0–100.0)
Monocytes Absolute: 0.5 10*3/uL (ref 0.1–1.0)
Monocytes Relative: 7.6 % (ref 3.0–12.0)
Neutro Abs: 3.7 10*3/uL (ref 1.4–7.7)
Neutrophils Relative %: 61.5 % (ref 43.0–77.0)
Platelets: 252 10*3/uL (ref 150.0–400.0)
RBC: 3.41 Mil/uL — ABNORMAL LOW (ref 3.87–5.11)
RDW: 14.3 % (ref 11.5–15.5)
WBC: 6 10*3/uL (ref 4.0–10.5)

## 2023-07-01 LAB — VITAMIN D 25 HYDROXY (VIT D DEFICIENCY, FRACTURES): VITD: 90.55 ng/mL (ref 30.00–100.00)

## 2023-07-01 LAB — PHOSPHORUS: Phosphorus: 3.2 mg/dL (ref 2.3–4.6)

## 2023-07-01 LAB — TSH: TSH: 2.5 u[IU]/mL (ref 0.35–5.50)

## 2023-07-01 NOTE — Patient Instructions (Signed)
 Chronic Kidney Disease in Adults: What to Know Chronic kidney disease (CKD) is when lasting damage happens to the kidneys slowly over time. The kidneys are two organs that do many important things in the body. These include: Taking waste and extra fluid out of the blood to make pee (urine). Making hormones. Keeping the right amount of fluids and chemicals in the body. A small amount of kidney damage may not cause problems. You must take steps to help keep the kidney damage from getting worse. A lot of damage may cause kidney failure. Kidney failure means the kidneys can no longer work right. What are the causes? Diabetes. High blood pressure. Diseases that affect the heart and blood vessels. Other kidney diseases. Diseases that affect the body's defense system (immune system). A problem with the flow of pee. This may be caused by: Kidney stones. Cancer. An enlarged prostate, in males. A kidney infection or urinary tract infection (UTI) that keeps coming back. What increases the risk? Getting older. The chances of having CKD increase with age. A family history of kidney disease or kidney failure. Having a disease caused by genes. Taking medicines that can harm the kidneys. Being near or having contact with harmful substances. Being very overweight. Using tobacco now or in the past. What are the signs or symptoms? Common symptoms of CKD include: Feeling very tired and having less energy. Swelling of the face, legs, ankles, or feet. Throwing up or feeling like you may throw up. Not wanting to eat as much as normal. Being confused or not able to focus. Twitches and cramps in the leg muscles or other muscles. Dry, itchy skin. Other symptoms may include: Shortness of breath. Trouble sleeping. Making less pee, or making more pee, especially at night. A taste of metal in your mouth. You may also become anemic. Anemia means there's not enough red blood cells in your blood. You may get  symptoms slowly. You may not notice them until the kidney damage gets very bad. How is this diagnosed? CKD may be diagnosed based on: Tests on your blood or pee. Imaging tests, like an ultrasound or a CT scan. A kidney biopsy. For this test, a sample of kidney tissue is removed to be looked at under a microscope. These tests will help to find out how serious the CKD is. How is this treated? Often, there's no cure for CKD. Treatment can help with symptoms and help keep the disease from getting worse. Treatment may include: Treating other problems that are causing your CKD or making it worse. Diet changes. You may need to: Avoid alcohol. Avoid foods that are high in salt, potassium, phosphorous, and protein. Taking medicines for symptoms and to help control other conditions. Dialysis. This treatment gets harmful waste out of your body. It may be needed if you have kidney failure. Follow these instructions at home: Medicines Take your medicines only as told. The amount of some medicines you take may need to be changed. Do not take any new medicines, vitamins, or supplements unless your health care provider says it's okay. These may make kidney damage worse. Lifestyle Do not smoke, vape, or use nicotine or tobacco. If you drink alcohol: Limit how much you have to: 0-1 drink a day if you're female. 0-2 drinks a day if you're female. Know how much alcohol is in your drink. In the [place] one drink is one 12 oz bottle of beer (355 mL), one 5 oz glass of wine (148 mL), or one 1 oz  glass of hard liquor (44 mL). Stay at a healthy weight. If you need help, ask your provider. General instructions  Eat and drink as told. Track your blood pressure at home. Tell your provider about any changes. If you have diabetes, track your blood sugar as told. Exercise at least 30 minutes a day, 5 days a week. Keep your shots (vaccinations) up to date. Keep all follow-up visits. Your provider may need to change  your treatments over time. Where to find support American Kidney Fund: [email or web address].[email or web address] Kidney School: kidneyschool.org American Association of Kidney Patients: https://www.miller-montoya.com/ Where to find more information National Kidney Foundation: kidney.org Centers for Disease Control and Prevention. To learn more: Go to DiningCalendar.de. Click "Search". Type "chronic kidney disease" in the search box. Contact a health care provider if: You have new symptoms. You get symptoms of end-stage kidney disease. These include: Headaches. Numbness in your hands or feet. Leg cramps. Easy bruising. Get help right away if: You have a fever. You make less pee than usual. You have pain or bleeding when you pee or poop. You have chest pain. You have shortness of breath. These symptoms may be an emergency. Call 911 right away. Do not wait to see if the symptoms will go away. Do not drive yourself to the hospital. This information is not intended to replace advice given to you by your health care provider. Make sure you discuss any questions you have with your health care provider. Document Revised: 12/23/2022 Document Reviewed: 08/15/2022 Elsevier Patient Education  2024 [employer].

## 2023-07-01 NOTE — Progress Notes (Signed)
 Subjective:  Patient ID: Cheryl [name], female    DOB: 04/01/1950  Age: 73 y.o. MRN: [id number]  CC: Medical Management of Chronic Issues (She's concerned about General fatigue and no energy. )   HPI Cheryl [name] presents for f/up ----  Discussed the use of AI scribe software for clinical note transcription with the patient, who gave verbal consent to proceed.  History of Present Illness   Cheryl [name] is a 73 year old female who presents with fatigue and low heart rate.  She experiences significant fatigue and a lack of energy, accompanied by occasional dizziness or lightheadedness. No breathing difficulties are reported.  Her heart rate is low, with a recent measurement of 57 beats per minute. She saw a nurse practitioner named Charles Connor yesterday but does not recall if an EKG was performed or if her low heart rate was addressed during that visit.  Her blood pressure was recorded as 100/68, slightly lower than a previous reading of 110/68 from yesterday. She mentions that her blood pressure has been fluctuating, with some readings in the 50s and 60s over the past several months.  She inquires about vitamin D  supplementation, but her vitamin D  levels are too high.       Outpatient Medications Prior to Visit  Medication Sig Dispense Refill   alendronate (FOSAMAX) 70 MG tablet Take 70 mg by mouth once a week. Take with a full glass of water on an empty stomach.     amLODipine  (NORVASC ) 2.5 MG tablet Take 1 tablet (2.5 mg total) by mouth daily. 30 tablet 0   Apoaequorin (PREVAGEN PO) Take 1 tablet by mouth daily.     aspirin  EC 81 MG tablet Take 1 tablet (81 mg total) by mouth daily. Swallow whole. 90 tablet 2   atorvastatin  (LIPITOR) 80 MG tablet Take 1 tablet (80 mg total) by mouth daily. 30 tablet 11   bimatoprost (LUMIGAN) 0.01 % SOLN Place 1 drop into both eyes at bedtime.     BIOTIN PO Take 1 tablet by mouth daily.     budesonide-formoterol (SYMBICORT) 80-4.5  MCG/ACT inhaler Inhale 2 puffs into the lungs daily as needed (for shortness of breath).     clopidogrel  (PLAVIX ) 75 MG tablet Take 1 tablet (75 mg total) by mouth daily. 90 tablet 2   nitroGLYCERIN  (NITROSTAT ) 0.4 MG SL tablet Place 1 tablet (0.4 mg total) under the tongue every 5 (five) minutes as needed. 25 tablet 2   Omega-3 Fatty Acids (FISH OIL PO) Take 1 capsule by mouth daily.     potassium chloride  SA (KLOR-CON  M20) 20 MEQ tablet Take 1 tablet (20 mEq total) by mouth daily. 90 tablet 1   spironolactone  (ALDACTONE ) 100 MG tablet Take 0.5 tablets (50 mg total) by mouth daily.     SYNTHROID  50 MCG tablet Take 50 mcg by mouth every morning.     thiamine  (VITAMIN B1) 100 MG tablet Take 1 tablet (100 mg total) by mouth daily. 90 tablet 1   No facility-administered medications prior to visit.    ROS Review of Systems  Constitutional:  Negative for appetite change, chills, diaphoresis, fatigue and fever.  HENT: Negative.    Eyes: Negative.   Respiratory:  Positive for shortness of breath. Negative for cough, chest tightness and wheezing.   Cardiovascular:  Negative for chest pain, palpitations and leg swelling.  Gastrointestinal:  Negative for abdominal pain, constipation, diarrhea, nausea and vomiting.  Endocrine: Negative.   Genitourinary: Negative.  Negative for difficulty urinating.  Musculoskeletal: Negative.   Skin: Negative.   Neurological:  Negative for dizziness, weakness, light-headedness and numbness.  Hematological:  Negative for adenopathy. Does not bruise/bleed easily.  Psychiatric/Behavioral: Negative.      Objective:  BP (!) 122/58 (BP Location: Left Arm, Patient Position: Sitting, Cuff Size: Small)   Pulse (!) 57   Temp (!) 97.4 F (36.3 C) (Oral)   Resp 16   Ht 5\' 4"  (1.626 m)   Wt 123 lb 6.4 oz (56 kg)   SpO2 95%   BMI 21.18 kg/m   BP Readings from Last 3 Encounters:  07/01/23 (!) 122/58  06/30/23 110/68  04/28/23 (!) 112/58    Wt Readings from Last  3 Encounters:  07/01/23 123 lb 6.4 oz (56 kg)  06/30/23 122 lb 6.4 oz (55.5 kg)  06/25/23 123 lb 8.4 oz (56 kg)    Physical Exam Vitals reviewed.  Constitutional:      Appearance: Normal appearance.  HENT:     Mouth/Throat:     Mouth: Mucous membranes are moist.  Eyes:     General: No scleral icterus.    Conjunctiva/sclera: Conjunctivae normal.  Cardiovascular:     Rate and Rhythm: Regular rhythm. Bradycardia present.     Pulses: Normal pulses.     Heart sounds: No murmur heard.    No friction rub. No gallop.     Comments: EKG- SB, 56 bpm NS T wave changes No LVH or Q waves Pulmonary:     Effort: Pulmonary effort is normal.     Breath sounds: No stridor. No wheezing, rhonchi or rales.  Abdominal:     General: Abdomen is flat.     Palpations: There is no mass.     Tenderness: There is no abdominal tenderness. There is no guarding.     Hernia: No hernia is present.  Musculoskeletal:     Cervical back: Neck supple.     Right lower leg: No edema.     Left lower leg: No edema.  Skin:    General: Skin is warm and dry.  Neurological:     General: No focal deficit present.     Mental Status: She is alert. Mental status is at baseline.  Psychiatric:        Mood and Affect: Mood normal.        Behavior: Behavior normal.     Lab Results  Component Value Date   WBC 6.0 07/01/2023   HGB 10.7 (L) 07/01/2023   HCT 32.6 (L) 07/01/2023   PLT 252.0 07/01/2023   GLUCOSE 95 07/01/2023   CHOL 160 10/03/2022   TRIG 95.0 10/03/2022   HDL 56.70 10/03/2022   LDLCALC 84 10/03/2022   ALT 10 10/03/2022   AST 22 10/03/2022   NA 140 07/01/2023   K 4.7 07/01/2023   CL 107 07/01/2023   CREATININE 1.69 (H) 07/01/2023   BUN 39 (H) 07/01/2023   CO2 25 07/01/2023   TSH 2.50 07/01/2023    No results found.  Assessment & Plan:  Hypertension due to endocrine disorder- BP is well controlled. -     Basic metabolic panel with GFR; Future -     CBC with Differential/Platelet;  Future -     TSH; Future  Bradycardia- She is asx with this. Labs are normal. -     TSH; Future -     EKG 12-Lead  Poisoning by vitamin D , undetermined intent, subsequent - Vit D is in the normal range now. -  VITAMIN D  25 Hydroxy (Vit-D Deficiency, Fractures); Future -     Phosphorus; Future     Follow-up: Return in about 3 months (around 10/01/2023).  Sandra Crouch, MD

## 2023-07-02 ENCOUNTER — Encounter (HOSPITAL_COMMUNITY)
Admission: RE | Admit: 2023-07-02 | Discharge: 2023-07-02 | Disposition: A | Discharge status: Home / Self Care | Payer: Medicare HMO | Source: Ambulatory Visit | Attending: Cardiovascular Disease | Admitting: Cardiovascular Disease

## 2023-07-02 DIAGNOSIS — Z955 Presence of coronary angioplasty implant and graft: Secondary | ICD-10-CM | POA: Diagnosis not present

## 2023-07-02 NOTE — Progress Notes (Signed)
 Daily Session Note  Patient Details  Name: Cheryl [name] MRN: [id number] Date of Birth: December 10, 1950 Referring Provider:   Flowsheet Row CARDIAC REHAB PHASE II ORIENTATION from 04/06/2023 in [facility] CARDIAC RE[facility]IL[facility]ATION  Referring Provider Antoinette Batman MD  [Primary Cardiologist: Dr. Donata Fryer Nishan]       Encounter Date: 07/02/2023  Check In:  Session Check In - 07/02/23 1352       Check-In   Supervising physician immediately available to respond to emergencies See telemetry face sheet for immediately available MD    Location [facility]    Staff Present Ronna Coho BSN, RN;Heather Toy Freund, BS, Exercise Physiologist;Burley Kopka [place], MA, RCEP, CCRP, CCET    Virtual Visit No    Medication changes reported     No    Fall or balance concerns reported    No    Warm-up and Cool-down Performed on first and last piece of equipment    Resistance Training Performed Yes    VAD Patient? No    PAD/SET Patient? No      Pain Assessment   Currently in Pain? No/denies             Capillary Blood Glucose: No results found for this or any previous visit (from the past 24 hours).    Social History   Tobacco Use  Smoking Status Never   Passive exposure: Never  Smokeless Tobacco Not on file    Goals Met:  Proper associated with RPD/PD & O2 Sat Independence with exercise equipment Using PLB without cueing & demonstrates good technique Exercise tolerated well No report of concerns or symptoms today Strength training completed today  Goals Unmet:  Not Applicable  Comments: Pt able to follow exercise prescription today without complaint.  Will continue to monitor for progression.

## 2023-07-07 ENCOUNTER — Encounter (HOSPITAL_COMMUNITY): Payer: Medicare HMO

## 2023-07-09 ENCOUNTER — Encounter (HOSPITAL_COMMUNITY)
Admission: RE | Admit: 2023-07-09 | Discharge: 2023-07-09 | Disposition: A | Discharge status: Home / Self Care | Payer: Medicare HMO | Source: Ambulatory Visit | Attending: Cardiovascular Disease | Admitting: Cardiovascular Disease

## 2023-07-09 DIAGNOSIS — Z955 Presence of coronary angioplasty implant and graft: Secondary | ICD-10-CM | POA: Diagnosis not present

## 2023-07-09 NOTE — Progress Notes (Signed)
 Discharge Progress Report  Patient Details  Name: Cheryl [name] MRN: [id number] Date of Birth: 07-May-1950 Referring Provider:   Flowsheet Row CARDIAC REHAB PHASE II ORIENTATION from 04/06/2023 in [facility]  Referring Provider Antoinette Batman MD  [Primary Cardiologist: Dr. Donata Fryer Nishan]        Number of Visits: 36  Reason for Discharge:  Patient reached a stable level of exercise. Patient independent in their exercise. Patient has met program and personal goals.   Diagnosis:  Status post coronary artery stent placement  ADL UCSD:   Initial Exercise Prescription:  Initial Exercise Prescription - 04/06/23 1400       Date of Initial Exercise RX and Referring Provider   Date 04/06/23    Referring Provider Antoinette Batman MD   Primary Cardiologist: Dr. Janelle Mediate     Oxygen   Maintain Oxygen Saturation 88% or higher      Treadmill   MPH 1.7    Grade 0.5    Minutes 15    METs 2.42      NuStep   Level 1    SPM 80    Minutes 15    METs 2      Prescription Details   Frequency (times per week) 2    Duration Progress to 30 minutes of continuous aerobic without signs/symptoms of physical distress      Intensity   THRR 40-80% of Max Heartrate 94-129    Ratings of Perceived Exertion 11-13    Perceived Dyspnea 0-4      Progression   Progression Continue to progress workloads to maintain intensity without signs/symptoms of physical distress.      Resistance Training   Training Prescription Yes    Weight 2 lb    Reps 10-15             Discharge Exercise Prescription (Final Exercise Prescription Changes):  Exercise Prescription Changes - 06/09/23 1500       Response to Exercise   Blood Pressure (Admit) 118/64    Blood Pressure (Exit) 120/60    Heart Rate (Admit) 61 bpm    Heart Rate (Exercise) 96 bpm    Heart Rate (Exit) 70 bpm    Rating of Perceived Exertion (Exercise) 13    Duration Continue with 30 min of  aerobic exercise without signs/symptoms of physical distress.    Intensity THRR unchanged      Progression   Progression Continue to progress workloads to maintain intensity without signs/symptoms of physical distress.      Resistance Training   Training Prescription Yes    Weight 3    Reps 10-15      Treadmill   MPH 2    Grade 0    Minutes 15    METs 2.53      NuStep   Level 2    SPM 80    Minutes 15    METs 2.1      Home Exercise Plan   Plans to continue exercise at Home (comment)    Frequency Add 3 additional days to program exercise sessions.             Functional Capacity:  6 Minute Walk     Row Name 04/06/23 1457 06/25/23 1517       6 Minute Walk   Phase Initial Discharge    Distance 1050 feet 1200 feet    Distance Feet Change -- 150 ft    Walk Time 6 minutes 6 minutes    #  of Rest Breaks 0 0    MPH 1.99 2.27    METS 2.54 2.71    RPE 13 12    Perceived Dyspnea  1 0    VO2 Peak 8.88 9.48    Symptoms Yes (comment) No    Comments R knee pain 3/10, SOB --    Resting HR 59 bpm 70 bpm    Resting BP 112/58 100/50    Resting Oxygen Saturation  99 % 96 %    Exercise Oxygen Saturation  during 6 min walk 99 % 96 %    Max Ex. HR 105 bpm 108 bpm    Max Ex. BP 128/74 112/50    2 Minute Post BP 108/56 110/60             Psychological, QOL, Others - Outcomes: PHQ 2/9:    07/09/2023    2:16 PM 07/01/2023   11:22 AM 04/06/2023    1:21 PM 10/10/2022   10:18 AM 10/03/2022    2:13 PM  Depression screen PHQ 2/9  Decreased Interest 1 1 1 1 1   Down, Depressed, Hopeless 1 0 1 0 0  PHQ - 2 Score 2 1 2 1 1   Altered sleeping 1 1 0    Tired, decreased energy 3 3 3     Change in appetite 1 1 3     Feeling bad or failure about yourself  0 0 0    Trouble concentrating 0 0 0    Moving slowly or fidgety/restless 0 0 0    Suicidal thoughts 0 0 0    PHQ-9 Score 7 6 8     Difficult doing work/chores Somewhat difficult Not difficult at all Very difficult     Nutrition  & Weight - Outcomes:  Pre Biometrics - 04/06/23 1500       Pre Biometrics   Height 5\' 4"  (1.626 m)    Weight 56.6 kg    Waist Circumference 28 inches    Hip Circumference 34 inches    Waist to Hip Ratio 0.82 %    BMI (Calculated) 21.41    Grip Strength 21.5 kg    Single Leg Stand 7.8 seconds             Post Biometrics - 06/25/23 1518        Post  Biometrics   Height 5\' 4"  (1.626 m)    Weight 56 kg    Waist Circumference 28 inches    Hip Circumference 32 inches    Waist to Hip Ratio 0.88 %    BMI (Calculated) 21.19    Grip Strength 21.8 kg    Single Leg Stand 15.26 seconds            Goals reviewed with patient; copy given to patient.

## 2023-07-09 NOTE — Progress Notes (Signed)
 Cardiac Individual Treatment Plan  Patient Details  Name: Cheryl [name] MRN: [id number] Date of Birth: [birth year] Referring Provider:   Flowsheet Row CARDIAC REHAB PHASE II ORIENTATION from 04/06/2023 in [facility] CARDIAC REHABILITATION  Referring Provider Antoinette Batman MD  [Primary Cardiologist: Dr. Donata Fryer Nishan]       Initial Encounter Date:  Flowsheet Row CARDIAC REHAB PHASE II ORIENTATION from 04/06/2023 in [place] CARDIAC REHABILITATION  Date 04/06/23       Visit Diagnosis: Status post coronary artery stent placement  Patient's Home Medications on Admission:  Current Outpatient Medications:    alendronate (FOSAMAX) 70 MG tablet, Take 70 mg by mouth once a week. Take with a full glass of water on an empty stomach., Disp: , Rfl:    amLODipine  (NORVASC ) 2.5 MG tablet, Take 1 tablet (2.5 mg total) by mouth daily., Disp: 30 tablet, Rfl: 0   Apoaequorin (PREVAGEN PO), Take 1 tablet by mouth daily., Disp: , Rfl:    aspirin  EC 81 MG tablet, Take 1 tablet (81 mg total) by mouth daily. Swallow whole., Disp: 90 tablet, Rfl: 2   atorvastatin  (LIPITOR) 80 MG tablet, Take 1 tablet (80 mg total) by mouth daily., Disp: 30 tablet, Rfl: 11   bimatoprost (LUMIGAN) 0.01 % SOLN, Place 1 drop into both eyes at bedtime., Disp: , Rfl:    BIOTIN PO, Take 1 tablet by mouth daily., Disp: , Rfl:    budesonide-formoterol (SYMBICORT) 80-4.5 MCG/ACT inhaler, Inhale 2 puffs into the lungs daily as needed (for shortness of breath)., Disp: , Rfl:    clopidogrel  (PLAVIX ) 75 MG tablet, Take 1 tablet (75 mg total) by mouth daily., Disp: 90 tablet, Rfl: 2   nitroGLYCERIN  (NITROSTAT ) 0.4 MG SL tablet, Place 1 tablet (0.4 mg total) under the tongue every 5 (five) minutes as needed., Disp: 25 tablet, Rfl: 2   Omega-3 Fatty Acids (FISH OIL PO), Take 1 capsule by mouth daily., Disp: , Rfl:    potassium chloride  SA (KLOR-CON  M20) 20 MEQ tablet, Take 1 tablet (20 mEq total) by mouth daily., Disp: 90 tablet,  Rfl: 1   spironolactone  (ALDACTONE ) 100 MG tablet, Take 0.5 tablets (50 mg total) by mouth daily., Disp: , Rfl:    SYNTHROID  50 MCG tablet, Take 50 mcg by mouth every morning., Disp: , Rfl:    thiamine  (VITAMIN B1) 100 MG tablet, Take 1 tablet (100 mg total) by mouth daily., Disp: 90 tablet, Rfl: 1  Past Medical History: Past Medical History:  Diagnosis Date   AAA (abdominal aortic aneurysm) (HCC)    Arthritis    osteoarthritis   Hyperlipidemia    Hypertension    Hypoaldosteronism (HCC)    Renal disorder    renal insufficiency   Sarcoidosis    Thyroid  disease    hypothyroidism    Tobacco Use: Social History   Tobacco Use  Smoking Status Never   Passive exposure: Never  Smokeless Tobacco Not on file    Labs: Review Flowsheet       Latest Ref Rng & Units 11/26/2020 01/02/2022 10/03/2022  Labs for ITP Cardiac and Pulmonary Rehab  Cholestrol 0 - 200 mg/dL 409     811  914   LDL (calc) 0 - 99 mg/dL 71     54  84   HDL-C >78.29 mg/dL 54     56.21  30.86   Trlycerides 0.0 - 149.0 mg/dL 74     57.8  46.9     Details       This  result is from an external source.         Capillary Blood Glucose: Lab Results  Component Value Date   GLUCAP 136 (H) 03/18/2023   GLUCAP 73 03/18/2023   GLUCAP 89 03/18/2023     Exercise Target Goals: Exercise Program Goal: Individual exercise prescription set using results from initial 6 min walk test and THRR while considering  patient's activity barriers and safety.   Exercise Prescription Goal: Starting with aerobic activity 30 plus minutes a day, 3 days per week for initial exercise prescription. Provide home exercise prescription and guidelines that participant acknowledges understanding prior to discharge.  Activity Barriers & Risk Stratification:  Activity Barriers & Cardiac Risk Stratification - 04/06/23 1318       Activity Barriers & Cardiac Risk Stratification   Activity Barriers Arthritis;Back Problems;Neck/Spine  Problems;Balance Concerns;History of Falls;Deconditioning;Muscular Weakness   on going back pain, dizzy spells since stent   Cardiac Risk Stratification Moderate             6 Minute Walk:  6 Minute Walk     Row Name 04/06/23 1457 06/25/23 1517       6 Minute Walk   Phase Initial Discharge    Distance 1050 feet 1200 feet    Distance Feet Change -- 150 ft    Walk Time 6 minutes 6 minutes    # of Rest Breaks 0 0    MPH 1.99 2.27    METS 2.54 2.71    RPE 13 12    Perceived Dyspnea  1 0    VO2 Peak 8.88 9.48    Symptoms Yes (comment) No    Comments R knee pain 3/10, SOB --    Resting HR 59 bpm 70 bpm    Resting BP 112/58 100/50    Resting Oxygen Saturation  99 % 96 %    Exercise Oxygen Saturation  during 6 min walk 99 % 96 %    Max Ex. HR 105 bpm 108 bpm    Max Ex. BP 128/74 112/50    2 Minute Post BP 108/56 110/60             Oxygen Initial Assessment:   Oxygen Re-Evaluation:   Oxygen Discharge (Final Oxygen Re-Evaluation):   Initial Exercise Prescription:  Initial Exercise Prescription - 04/06/23 1400       Date of Initial Exercise RX and Referring Provider   Date 04/06/23    Referring Provider Antoinette Batman MD   Primary Cardiologist: Dr. Janelle Mediate     Oxygen   Maintain Oxygen Saturation 88% or higher      Treadmill   MPH 1.7    Grade 0.5    Minutes 15    METs 2.42      NuStep   Level 1    SPM 80    Minutes 15    METs 2      Prescription Details   Frequency (times per week) 2    Duration Progress to 30 minutes of continuous aerobic without signs/symptoms of physical distress      Intensity   THRR 40-80% of Max Heartrate 94-129    Ratings of Perceived Exertion 11-13    Perceived Dyspnea 0-4      Progression   Progression Continue to progress workloads to maintain intensity without signs/symptoms of physical distress.      Resistance Training   Training Prescription Yes    Weight 2 lb    Reps 10-15  Perform Capillary Blood Glucose checks as needed.  Exercise Prescription Changes:   Exercise Prescription Changes     Row Name 04/06/23 1400 04/14/23 1500 04/21/23 1500 04/30/23 1300 05/05/23 1500     Response to Exercise   Blood Pressure (Admit) 112/58 108/60 116/62 108/60 100/60   Blood Pressure (Exercise) 128/74 132/70 110/50 108/50 102/60   Blood Pressure (Exit) 108/56 112/70 118/62 108/62 122/62   Heart Rate (Admit) 59 bpm 90 bpm 82 bpm 77 bpm 72 bpm   Heart Rate (Exercise) 105 bpm 98 bpm 92 bpm 93 bpm 98 bpm   Heart Rate (Exit) 62 bpm 84 bpm 78 bpm 75 bpm 71 bpm   Oxygen Saturation (Admit) 99 % -- -- -- --   Oxygen Saturation (Exercise) 99 % -- -- -- --   Rating of Perceived Exertion (Exercise) 13 13 12 13 13    Perceived Dyspnea (Exercise) 1 1 -- -- --   Symptoms R knee pain 3/10, SOB -- -- -- --   Comments walk test results -- -- -- --   Duration -- Continue with 30 min of aerobic exercise without signs/symptoms of physical distress. Continue with 30 min of aerobic exercise without signs/symptoms of physical distress. Continue with 30 min of aerobic exercise without signs/symptoms of physical distress. Continue with 30 min of aerobic exercise without signs/symptoms of physical distress.   Intensity -- THRR unchanged THRR unchanged THRR unchanged THRR unchanged     Progression   Progression -- Continue to progress workloads to maintain intensity without signs/symptoms of physical distress. Continue to progress workloads to maintain intensity without signs/symptoms of physical distress. Continue to progress workloads to maintain intensity without signs/symptoms of physical distress. Continue to progress workloads to maintain intensity without signs/symptoms of physical distress.     Resistance Training   Training Prescription -- Yes Yes Yes Yes   Weight -- 2 2 2 2    Reps -- 10-15 10-15 10-15 10-15     Treadmill   MPH -- 1.3 1.7 1.8 1.8   Grade -- 0.5 0 0 0   Minutes --  15 15 15 15    METs -- 2.08 2.3 2.38 2.38     NuStep   Level -- 1 2 3 2    SPM -- 60 62 62 64   Minutes -- 15 15 15 15    METs -- 1.7 1.5 1.7 1.8     Home Exercise Plan   Plans to continue exercise at -- -- -- Home (comment)  walking, bike, pedal machine Home (comment)   Frequency -- -- -- Add 3 additional days to program exercise sessions. Add 3 additional days to program exercise sessions.   Initial Home Exercises Provided -- -- -- 04/30/23 --    Row Name 05/19/23 1500 06/09/23 1500           Response to Exercise   Blood Pressure (Admit) 108/50 118/64      Blood Pressure (Exit) 100/60 120/60      Heart Rate (Admit) 63 bpm 61 bpm      Heart Rate (Exercise) 92 bpm 96 bpm      Heart Rate (Exit) 65 bpm 70 bpm      Rating of Perceived Exertion (Exercise) 13 13      Duration Continue with 30 min of aerobic exercise without signs/symptoms of physical distress. Continue with 30 min of aerobic exercise without signs/symptoms of physical distress.      Intensity THRR unchanged THRR unchanged  Progression   Progression Continue to progress workloads to maintain intensity without signs/symptoms of physical distress. Continue to progress workloads to maintain intensity without signs/symptoms of physical distress.        Resistance Training   Training Prescription Yes Yes      Weight 2 3      Reps 10-15 10-15        Treadmill   MPH 2 2      Grade 0 0      Minutes 15 15      METs 2.53 2.53        NuStep   Level 2 2      SPM 76 80      Minutes 15 15      METs 2 2.1        Home Exercise Plan   Plans to continue exercise at Home (comment) Home (comment)      Frequency Add 3 additional days to program exercise sessions. Add 3 additional days to program exercise sessions.               Exercise Comments:   Exercise Comments     Row Name 04/14/23 1347           Exercise Comments : First full day of exercise!  Patient was oriented to gym and equipment including  functions, settings, policies, and procedures.  Patient's individual exercise prescription and treatment plan were reviewed.  All starting workloads were established based on the results of the 6 minute walk test done at initial orientation visit.  The plan for exercise progression was also introduced and progression will be customized based on patient's performance and goals.                Exercise Goals and Review:   Exercise Goals     Row Name 04/06/23 1459             Exercise Goals   Increase Physical Activity Yes       Intervention Provide advice, education, support and counseling about physical activity/exercise needs.;Develop an individualized exercise prescription for aerobic and resistive training based on initial evaluation findings, risk stratification, comorbidities and participant's personal goals.       Expected Outcomes Short Term: Attend rehab on a regular basis to increase amount of physical activity.;Long Term: Add in home exercise to make exercise part of routine and to increase amount of physical activity.;Long Term: Exercising regularly at least 3-5 days a week.       Increase Strength and Stamina Yes       Intervention Provide advice, education, support and counseling about physical activity/exercise needs.;Develop an individualized exercise prescription for aerobic and resistive training based on initial evaluation findings, risk stratification, comorbidities and participant's personal goals.       Expected Outcomes Short Term: Increase workloads from initial exercise prescription for resistance, speed, and METs.;Short Term: Perform resistance training exercises routinely during rehab and add in resistance training at home;Long Term: Improve cardiorespiratory fitness, muscular endurance and strength as measured by increased METs and functional capacity ( )       Able to understand and use rate of perceived exertion (RPE) scale Yes       Intervention Provide  education and explanation on how to use RPE scale       Expected Outcomes Short Term: Able to use RPE daily in rehab to express subjective intensity level;Long Term:  Able to use RPE to guide intensity level when exercising independently  Able to understand and use Dyspnea scale Yes       Intervention Provide education and explanation on how to use Dyspnea scale       Expected Outcomes Short Term: Able to use Dyspnea scale daily in rehab to express subjective sense of shortness of breath during exertion;Long Term: Able to use Dyspnea scale to guide intensity level when exercising independently       Knowledge and understanding of Target Heart Rate Range (THRR) Yes       Intervention Provide education and explanation of THRR including how the numbers were predicted and where they are located for reference       Expected Outcomes Short Term: Able to state/look up THRR;Long Term: Able to use THRR to govern intensity when exercising independently;Short Term: Able to use daily as guideline for intensity in rehab       Able to check pulse independently Yes       Intervention Provide education and demonstration on how to check pulse in carotid and radial arteries.;Review the importance of being able to check your own pulse for safety during independent exercise       Expected Outcomes Short Term: Able to explain why pulse checking is important during independent exercise;Long Term: Able to check pulse independently and accurately       Understanding of Exercise Prescription Yes       Intervention Provide education, explanation, and written materials on patient's individual exercise prescription       Expected Outcomes Short Term: Able to explain program exercise prescription;Long Term: Able to explain home exercise prescription to exercise independently                Exercise Goals Re-Evaluation :  Exercise Goals Re-Evaluation     Row Name 04/14/23 1347 04/22/23 1358 04/30/23 1355 05/04/23  1244 05/06/23 0831     Exercise Goal Re-Evaluation   Exercise Goals Review Able to understand and use rate of perceived exertion (RPE) scale;Knowledge and understanding of Target Heart Rate Range (THRR);Understanding of Exercise Prescription;Able to understand and use Dyspnea scale Increase Physical Activity;Increase Strength and Stamina;Understanding of Exercise Prescription Increase Physical Activity;Increase Strength and Stamina;Understanding of Exercise Prescription Increase Physical Activity;Increase Strength and Stamina;Understanding of Exercise Prescription Increase Physical Activity;Increase Strength and Stamina;Understanding of Exercise Prescription   Comments Reviewed RPE and dyspnea scale, THR and program prescription with pt today.  Pt voiced understanding and was given a copy of goals to take home. Selby is doing well in rehab and is tolerating exercise well. She has just started the progrream and is getting use to exercising. Will continue to monitor and progress as able. Maekayla is doing well in rehab.  She is already starting to push herself some.  Reviewed home exercise with pt today.  Pt plans to walk and use bike and pedal machine at home for exercise.  Reviewed THR, pulse, RPE, sign and symptoms, pulse oximetery and when to call 911 or MD.  Also discussed weather considerations and indoor options.  Pt voiced understanding. Rickea is doing well in rehab and is on her 9th visit. --   Expected Outcomes Short: Use RPE daily to regulate intensity.  Long: Follow program prescription in THR. continue to attend rehab Short: Start to add in more exercise at home Long: Conitnue to improve stamina -- --    Row Name 06/04/23 1344             Exercise Goal Re-Evaluation   Exercise Goals Review Increase Physical Activity;Increase  Strength and Stamina;Understanding of Exercise Prescription       Comments Celestial is doing well in rehab.  She is starting to feel stronger and has more  stamina.  She has not been using her equipment at home.  She feels tired a lot and feels that way every day. We talked about trying for 5 min on her bike each day. She has found that being seated to exercise is easier for her to do.       Expected Outcomes SHort; Start to add in 5 min of exercise on her days off Long: Continue to improve stamina                 Discharge Exercise Prescription (Final Exercise Prescription Changes):  Exercise Prescription Changes - 06/09/23 1500       Response to Exercise   Blood Pressure (Admit) 118/64    Blood Pressure (Exit) 120/60    Heart Rate (Admit) 61 bpm    Heart Rate (Exercise) 96 bpm    Heart Rate (Exit) 70 bpm    Rating of Perceived Exertion (Exercise) 13    Duration Continue with 30 min of aerobic exercise without signs/symptoms of physical distress.    Intensity THRR unchanged      Progression   Progression Continue to progress workloads to maintain intensity without signs/symptoms of physical distress.      Resistance Training   Training Prescription Yes    Weight 3    Reps 10-15      Treadmill   MPH 2    Grade 0    Minutes 15    METs 2.53      NuStep   Level 2    SPM 80    Minutes 15    METs 2.1      Home Exercise Plan   Plans to continue exercise at Home (comment)    Frequency Add 3 additional days to program exercise sessions.             Nutrition:  Target Goals: Understanding of nutrition guidelines, daily intake of sodium 1500mg , cholesterol 200mg , calories 30% from fat and 7% or less from saturated fats, daily to have 5 or more servings of fruits and vegetables.  Biometrics:  Pre Biometrics - 04/06/23 1500       Pre Biometrics   Height 5\' 4"  (1.626 m)    Weight 56.6 kg    Waist Circumference 28 inches    Hip Circumference 34 inches    Waist to Hip Ratio 0.82 %    BMI (Calculated) 21.41    Grip Strength 21.5 kg    Single Leg Stand 7.8 seconds             Post Biometrics - 06/25/23 1518         Post  Biometrics   Height 5\' 4"  (1.626 m)    Weight 56 kg    Waist Circumference 28 inches    Hip Circumference 32 inches    Waist to Hip Ratio 0.88 %    BMI (Calculated) 21.19    Grip Strength 21.8 kg    Single Leg Stand 15.26 seconds             Nutrition Therapy Plan and Nutrition Goals:  Nutrition Therapy & Goals - 04/06/23 1326       Intervention Plan   Intervention Prescribe, educate and counsel regarding individualized specific dietary modifications aiming towards targeted core components such as weight, hypertension, lipid management, diabetes, heart  failure and other comorbidities.;Nutrition handout(s) given to patient.    Expected Outcomes Short Term Goal: Understand basic principles of dietary content, such as calories, fat, sodium, cholesterol and nutrients.;Long Term Goal: Adherence to prescribed nutrition plan.             Nutrition Assessments:  MEDIFICTS Score Key: >=70 Need to make dietary changes  40-70 Heart Healthy Diet <= 40 Therapeutic Level Cholesterol Diet  Flowsheet Row CARDIAC REHAB PHASE II EXERCISE from 07/09/2023 in [facility] CARDIAC REHABILITATION  Picture Your Plate Total Score on Discharge 58      Picture Your Plate Scores: <54 Unhealthy dietary pattern with much room for improvement. 41-50 Dietary pattern unlikely to meet recommendations for good health and room for improvement. 51-60 More healthful dietary pattern, with some room for improvement.  >60 Healthy dietary pattern, although there may be some specific behaviors that could be improved.    Nutrition Goals Re-Evaluation:  Nutrition Goals Re-Evaluation     Row Name 04/30/23 1400 06/04/23 1349           Goals   Nutrition Goal Eat enough Short: Try meal replacement shakes Long: conitnue to increase intake      Comment Alethia is doing well in rehab.  She feels that she is not eating enough.  She does not have much appetite.  We talked about mechanical eating,  which she has been trying to do.  We also talked about trying out portein and meal replacement shakes to supplement.  She is concerned that she is losing weight. We talked about the importance of getting enough protein as well to get in extra calories. Dell is doing well in rehab. She has started to eat mroe and get her appetite back some. We talked again about the meal replacement shakes to get in extra calories.  She is open to trying them.  She is wanting to gain weight and muscle.  We talked about making sure to get the protein based ones and talked about where they are in the store.  She is also is wanting to increase her water intake.      Expected Outcome Short: Try meal replacement shakes Long: conitnue to increase intake Short: Try protein shakes Long; Drink more water               Nutrition Goals Discharge (Final Nutrition Goals Re-Evaluation):  Nutrition Goals Re-Evaluation - 06/04/23 1349       Goals   Nutrition Goal Short: Try meal replacement shakes Long: conitnue to increase intake    Comment Karra is doing well in rehab. She has started to eat mroe and get her appetite back some. We talked again about the meal replacement shakes to get in extra calories.  She is open to trying them.  She is wanting to gain weight and muscle.  We talked about making sure to get the protein based ones and talked about where they are in the store.  She is also is wanting to increase her water intake.    Expected Outcome Short: Try protein shakes Long; Drink more water             Psychosocial: Target Goals: Acknowledge presence or absence of significant depression and/or stress, maximize coping skills, provide positive support system. Participant is able to verbalize types and ability to use techniques and skills needed for reducing stress and depression.  Initial Review & Psychosocial Screening:  Initial Psych Review & Screening - 04/06/23 1324  Initial Review   Current  issues with Current Stress Concerns;Current Depression    Source of Stress Concerns Unable to participate in former interests or hobbies;Unable to perform yard/household activities;Chronic Illness    Comments no appetite and not eating thus has loss of energy, not getting enough protein, dizziness and fatigue causing her not to be able to do things      Family Dynamics   Good Support System? Yes   husband, sisters, has son as well she can call on     Barriers   Psychosocial barriers to participate in program Psychosocial barriers identified (see note);The patient should benefit from training in stress management and relaxation.      Screening Interventions   Interventions Encouraged to exercise;To provide support and resources with identified psychosocial needs;Provide feedback about the scores to participant    Expected Outcomes Short Term goal: Utilizing psychosocial counselor, staff and physician to assist with identification of specific Stressors or current issues interfering with healing process. Setting desired goal for each stressor or current issue identified.;Long Term Goal: Stressors or current issues are controlled or eliminated.;Short Term goal: Identification and review with participant of any Quality of Life or Depression concerns found by scoring the questionnaire.;Long Term goal: The participant improves quality of Life and PHQ9 Scores as seen by post scores and/or verbalization of changes             Quality of Life Scores:  Quality of Life - 07/09/23 1457       Quality of Life   Select Quality of Life      Quality of Life Scores   Health/Function Pre 28.23 %    Health/Function Post 23.9 %    Health/Function % Change -15.34 %    Socioeconomic Pre 28.21 %    Socioeconomic Post 27.86 %    Socioeconomic % Change  -1.24 %    Psych/Spiritual Pre 27.43 %    Psych/Spiritual Post 26.86 %    Psych/Spiritual % Change -2.08 %    Family Pre 28.8 %    Family Post 28.8 %     Family % Change 0 %    GLOBAL Pre 26.82 %    GLOBAL Post 26.04 %    GLOBAL % Change -2.91 %            Scores of 19 and below usually indicate a poorer quality of life in these areas.  A difference of  2-3 points is a clinically meaningful difference.  A difference of 2-3 points in the total score of the Quality of Life Index has been associated with significant improvement in overall quality of life, self-image, physical symptoms, and general health in studies assessing change in quality of life.  PHQ-9: Review Flowsheet  More data exists      07/09/2023 07/01/2023 04/06/2023 10/10/2022 10/03/2022  Depression screen PHQ 2/9  Decreased Interest 1 1 1 1 1   Down, Depressed, Hopeless 1 0 1 0 0  PHQ - 2 Score 2 1 2 1 1   Altered sleeping 1 1 0 - -  Tired, decreased energy 3 3 3  - -  Change in appetite 1 1 3  - -  Feeling bad or failure about yourself  0 0 0 - -  Trouble concentrating 0 0 0 - -  Moving slowly or fidgety/restless 0 0 0 - -  Suicidal thoughts 0 0 0 - -  PHQ-9 Score 7 6 8  - -  Difficult doing work/chores Somewhat difficult Not difficult at all  Very difficult - -   Interpretation of Total Score  Total Score Depression Severity:  1-4 = Minimal depression, 5-9 = Mild depression, 10-14 = Moderate depression, 15-19 = Moderately severe depression, 20-27 = Severe depression   Psychosocial Evaluation and Intervention:  Psychosocial Evaluation - 04/06/23 1500       Psychosocial Evaluation & Interventions   Interventions Stress management education;Encouraged to exercise with the program and follow exercise prescription    Comments Kinsler is coming into cardiac rehab after a stent to her LAD.  Since her stent she has been having several dizzy spells and dealing with extreme fatigue which have prevented her from being able to do everything that she wants to do.  She is eager to feel better.  She is concerned that she will not be able to do all of the exercise.  She was pleased with  how well she did today and not needing to stop during her walk test.  She was assured that she could take rest breaks as needed and that we would only compare her to herself and not others in her class.  She does also have a history of sarcoidosis and renal disease.  She wants to be able to go and do as she pleases and not get tired in the grocery store. We talked about how exercise can help her improve her strength and stamina.  Her PHQ did note some depression and she admitted to having some down days, but felt that it does not last, but last few weeks have definitely be tough on her.  She usually sleeps well.  She has a good support syster in her husband and sister and will call her son when needed.  She also attends church every Sunday.  Her doctors feel that her dizziness and fatigue may be coming from her loss of appetite as she is not eating and has lost weight.  She was encouraged to make sure she is eating healthy and eating enough protein to maintain her weight and normal activities.  She was open to continuing to work on her diet and trying to eat more.    Expected Outcomes Short: Attend rehab to build up stamina Long: Be able to go shopping like she enjoys    Continue Psychosocial Services  Follow up required by staff             Psychosocial Re-Evaluation:  Psychosocial Re-Evaluation     Row Name 04/30/23 1357 06/04/23 1347           Psychosocial Re-Evaluation   Current issues with Current Stress Concerns Current Stress Concerns      Comments Daquisha is doing well in rehab.  She is feeling good mentally and denies any major stressors currently.  Her dizzy spells have gotten a little bit better they are now just in the morning.  She has talked to doctor about them, but nothing improved yet.  She is sleeping pretty good most nights and usually gets about 6 hours. Samorah is doing well in rehab.  She is feeling good mentally.  She was dizzy today, but had a little to drink and it  helped.  She is still sleeping well.  She is a night owl but then will sleep late. Her dizzy spells had gotten better until today.  She is also dealing with pollen and AC being on.      Expected Outcomes Short: Continue to work on dizzy spells  Long: Continue to focus on the postives Short:  Continue to exercise for mental boost Long: Continue to sleep well.      Interventions Encouraged to attend Cardiac Rehabilitation for the exercise Encouraged to attend Cardiac Rehabilitation for the exercise      Continue Psychosocial Services  Follow up required by staff Follow up required by staff               Psychosocial Discharge (Final Psychosocial Re-Evaluation):  Psychosocial Re-Evaluation - 06/04/23 1347       Psychosocial Re-Evaluation   Current issues with Current Stress Concerns    Comments Gabriella is doing well in rehab.  She is feeling good mentally.  She was dizzy today, but had a little to drink and it helped.  She is still sleeping well.  She is a night owl but then will sleep late. Her dizzy spells had gotten better until today.  She is also dealing with pollen and AC being on.    Expected Outcomes Short: Continue to exercise for mental boost Long: Continue to sleep well.    Interventions Encouraged to attend Cardiac Rehabilitation for the exercise    Continue Psychosocial Services  Follow up required by staff             Vocational Rehabilitation: Provide vocational rehab assistance to qualifying candidates.   Vocational Rehab Evaluation & Intervention:  Vocational Rehab - 04/06/23 1319       Initial Vocational Rehab Evaluation & Intervention   Assessment shows need for Vocational Rehabilitation No   retired            Education: Education Goals: Education classes will be provided on a weekly basis, covering required topics. Participant will state understanding/return demonstration of topics presented.  Learning Barriers/Preferences:  Learning  Barriers/Preferences - 04/06/23 1327       Learning Barriers/Preferences   Learning Barriers Sight   glasses for reading   Learning Preferences Skilled Demonstration;Written Material             Education Topics: Hypertension, Hypertension Reduction -Define heart disease and high blood pressure. Discus how high blood pressure affects the body and ways to reduce high blood pressure.   Exercise and Your Heart -Discuss why it is important to exercise, the FITT principles of exercise, normal and abnormal responses to exercise, and how to exercise safely. Flowsheet Row CARDIAC REHAB PHASE II EXERCISE from 07/09/2023 in [place] CARDIAC REHABILITATION  Date 06/11/23  Educator hb  Instruction Review Code 1- Verbalizes Understanding       Angina -Discuss definition of angina, causes of angina, treatment of angina, and how to decrease risk of having angina. Flowsheet Row CARDIAC REHAB PHASE II EXERCISE from 07/09/2023 in [place] CARDIAC REHABILITATION  Date 06/04/23  [tests/procedures]  Educator HB  Instruction Review Code 1- Verbalizes Understanding       Cardiac Medications -Review what the following cardiac medications are used for, how they affect the body, and side effects that may occur when taking the medications.  Medications include Aspirin , Beta blockers, calcium  channel blockers, ACE Inhibitors, angiotensin receptor blockers, diuretics, digoxin, and antihyperlipidemics. Flowsheet Row CARDIAC REHAB PHASE II EXERCISE from 07/09/2023 in [place] CARDIAC REHABILITATION  Date 05/21/23  Educator hb  Instruction Review Code 1- Verbalizes Understanding       Congestive Heart Failure -Discuss the definition of CHF, how to live with CHF, the signs and symptoms of CHF, and how keep track of weight and sodium intake. Flowsheet Row CARDIAC REHAB PHASE II EXERCISE from 07/09/2023 in [facility] CARDIAC  REHABILITATION  Date 05/14/23  Educator [facility]  Instruction Review Code 1-  Verbalizes Understanding       Heart Disease and Intimacy -Discus the effect sexual activity has on the heart, how changes occur during intimacy as we age, and safety during sexual activity. Flowsheet Row CARDIAC REHAB PHASE II EXERCISE from 07/09/2023 in [place] CARDIAC REHABILITATION  Date 06/18/23  Educator jh  Instruction Review Code 1- Verbalizes Understanding       Smoking Cessation / COPD -Discuss different methods to quit smoking, the health benefits of quitting smoking, and the definition of COPD.   Nutrition I: Fats -Discuss the types of cholesterol, what cholesterol does to the heart, and how cholesterol levels can be controlled.   Nutrition II: Labels -Discuss the different components of food labels and how to read food label   Heart Parts/Heart Disease and PAD -Discuss the anatomy of the heart, the pathway of blood circulation through the heart, and these are affected by heart disease.   Stress I: Signs and Symptoms -Discuss the causes of stress, how stress may lead to anxiety and depression, and ways to limit stress. Flowsheet Row CARDIAC REHAB PHASE II EXERCISE from 07/09/2023 in [place] CARDIAC REHABILITATION  Date 05/07/23  Educator DJ  Instruction Review Code 1- Verbalizes Understanding       Stress II: Relaxation -Discuss different types of relaxation techniques to limit stress. Flowsheet Row CARDIAC REHAB PHASE II EXERCISE from 07/09/2023 in [place] CARDIAC REHABILITATION  Date 04/23/23  Educator [facility]  Instruction Review Code 1- Verbalizes Understanding       Warning Signs of Stroke / TIA -Discuss definition of a stroke, what the signs and symptoms are of a stroke, and how to identify when someone is having stroke.   Knowledge Questionnaire Score:  Knowledge Questionnaire Score - 07/09/23 1411       Knowledge Questionnaire Score   Post Score 23/24             Core Components/Risk Factors/Patient Goals at Admission:  Personal  Goals and Risk Factors at Admission - 04/06/23 1559       Core Components/Risk Factors/Patient Goals on Admission    Weight Management Yes;Weight Gain;Weight Maintenance    Intervention Weight Management: Develop a combined nutrition and exercise program designed to reach desired caloric intake, while maintaining appropriate intake of nutrient and fiber, sodium and fats, and appropriate energy expenditure required for the weight goal.;Weight Management: Provide education and appropriate resources to help participant work on and attain dietary goals.    Admit Weight 124 lb 12.8 oz (56.6 kg)    Goal Weight: Short Term 126 lb (57.2 kg)    Goal Weight: Long Term 126 lb (57.2 kg)    Expected Outcomes Short Term: Continue to assess and modify interventions until short term weight is achieved;Long Term: Adherence to nutrition and physical activity/exercise program aimed toward attainment of established weight goal;Weight Maintenance: Understanding of the daily nutrition guidelines, which includes 25-35% calories from fat, 7% or less cal from saturated fats, less than 200mg  cholesterol, less than 1.5gm of sodium, & 5 or more servings of fruits and vegetables daily;Weight Gain: Understanding of general recommendations for a high calorie, high protein meal plan that promotes weight gain by distributing calorie intake throughout the day with the consumption for 4-5 meals, snacks, and/or supplements    Hypertension Yes    Intervention Provide education on lifestyle modifcations including regular physical activity/exercise, weight management, moderate sodium restriction and increased consumption of fresh fruit,  vegetables, and low fat dairy, alcohol moderation, and smoking cessation.;Monitor prescription use compliance.    Expected Outcomes Short Term: Continued assessment and intervention until BP is < 140/19mm HG in hypertensive participants. < 130/18mm HG in hypertensive participants with diabetes, heart failure  or chronic kidney disease.;Long Term: Maintenance of blood pressure at goal levels.    Lipids Yes    Intervention Provide education and support for participant on nutrition & aerobic/resistive exercise along with prescribed medications to achieve LDL 70mg , HDL >40mg .    Expected Outcomes Short Term: Participant states understanding of desired cholesterol values and is compliant with medications prescribed. Participant is following exercise prescription and nutrition guidelines.;Long Term: Cholesterol controlled with medications as prescribed, with individualized exercise RX and with personalized nutrition plan. Value goals: LDL < 70mg , HDL > 40 mg.             Core Components/Risk Factors/Patient Goals Review:   Goals and Risk Factor Review     Row Name 04/30/23 1402 06/04/23 1354           Core Components/Risk Factors/Patient Goals Review   Personal Goals Review Weight Management/Obesity;Hypertension;Lipids Weight Management/Obesity;Hypertension;Lipids      Review Amelyah is doing well in rehab.  She is concerned about losing weight.  She does not have much appetite currently.  We talked about mechaincal eating and trying protein shakes to supplement.  She wants to maintain her strength.  Her blood pressure are doing well and she checks them at home.  Her only concern with her medications is her lack of appetite. She also had a call with a pharmacist to review meds earlier this week. Siclaly is doing well in rehab.  She is working on gaining well.  She is going to try some protein shakes to help.  Her pressures are doing well overall.  She has had a few low readings but overall doing well.  We talked about water to help with blood pressure control.  She is doing well on her medications overall.  She is meeting with her allergist tomorow.      Expected Outcomes Short: Work on supplementing diet to gain weight back Long; continue to monitor risk factors. Short: Continue to work on gaining  weight Long: Continue to keep close eye on her blood pressure               Core Components/Risk Factors/Patient Goals at Discharge (Final Review):   Goals and Risk Factor Review - 06/04/23 1354       Core Components/Risk Factors/Patient Goals Review   Personal Goals Review Weight Management/Obesity;Hypertension;Lipids    Review Lorrain is doing well in rehab.  She is working on gaining well.  She is going to try some protein shakes to help.  Her pressures are doing well overall.  She has had a few low readings but overall doing well.  We talked about water to help with blood pressure control.  She is doing well on her medications overall.  She is meeting with her allergist tomorow.    Expected Outcomes Short: Continue to work on gaining weight Long: Continue to keep close eye on her blood pressure             ITP Comments:  ITP Comments     Row Name 04/06/23 1451 04/14/23 1347 04/22/23 1357 05/20/23 1154 06/17/23 1457   ITP Comments Patient attend orientation today.  Patient is attending Cardiac Rehabilitation Program.  Documentation for diagnosis can be found in 03/18/23.  Reviewed medical  chart, RPE/RPD, gym safety, and program guidelines.  Patient was fitted to equipment they will be using during rehab.  Patient is scheduled to start exercise on Tuesday 04/14/23 at 1330.   Initial ITP created and sent for review and signature by Dr. Armida Lander, Medical Director for Cardiac Rehabilitation Program. : First full day of exercise!  Patient was oriented to gym and equipment including functions, settings, policies, and procedures.  Patient's individual exercise prescription and treatment plan were reviewed.  All starting workloads were established based on the results of the 6 minute walk test done at initial orientation visit.  The plan for exercise progression was also introduced and progression will be customized based on patient's performance and goals. 30 day review completed. ITP  sent to Dr. Armida Lander, Medical Director of Cardiac Rehab. Continue with ITP unless changes are made by physician. Still new to program. 30 day review completed. ITP sent to Dr. Armida Lander, Medical Director of Cardiac Rehab. Continue with ITP unless changes are made by physician. 30 day review completed. ITP sent to Dr. Armida Lander, Medical Director of Cardiac Rehab. Continue with ITP unless changes are made by physician.    Row Name 07/09/23 1459           ITP Comments Caitlyne graduated today from  rehab with 36 sessions completed.  Details of the patient's exercise prescription and what She needs to do in order to continue the prescription and progress were discussed with patient.  Patient was given a copy of prescription and goals.  Patient verbalized understanding. Keylin plans to continue to exercise by exercising at home.                Comments: discharge itp

## 2023-07-09 NOTE — Patient Instructions (Signed)
 Discharge Patient Instructions  Patient Details  Name: Cheryl [name] MRN: [id number] Date of Birth: 1951-01-08 Referring Provider:  Arcadio Knuckles, MD   Number of Visits: 73  Reason for Discharge:  Patient reached a stable level of exercise. Patient independent in their exercise. Patient has met program and personal goals.  Diagnosis:  No diagnosis found.  Initial Exercise Prescription:  Initial Exercise Prescription - 04/06/23 1400       Date of Initial Exercise RX and Referring Provider   Date 04/06/23    Referring Provider Antoinette Batman MD   Primary Cardiologist: Dr. Janelle Mediate     Oxygen   Maintain Oxygen Saturation 88% or higher      Treadmill   MPH 1.7    Grade 0.5    Minutes 15    METs 2.42      NuStep   Level 1    SPM 80    Minutes 15    METs 2      Prescription Details   Frequency (times per week) 2    Duration Progress to 30 minutes of continuous aerobic without signs/symptoms of physical distress      Intensity   THRR 40-80% of Max Heartrate 94-129    Ratings of Perceived Exertion 11-13    Perceived Dyspnea 0-4      Progression   Progression Continue to progress workloads to maintain intensity without signs/symptoms of physical distress.      Resistance Training   Training Prescription Yes    Weight 2 lb    Reps 10-15             Discharge Exercise Prescription (Final Exercise Prescription Changes):  Exercise Prescription Changes - 06/09/23 1500       Response to Exercise   Blood Pressure (Admit) 118/64    Blood Pressure (Exit) 120/60    Heart Rate (Admit) 61 bpm    Heart Rate (Exercise) 96 bpm    Heart Rate (Exit) 70 bpm    Rating of Perceived Exertion (Exercise) 13    Duration Continue with 30 min of aerobic exercise without signs/symptoms of physical distress.    Intensity THRR unchanged      Progression   Progression Continue to progress workloads to maintain intensity without signs/symptoms of physical  distress.      Resistance Training   Training Prescription Yes    Weight 3    Reps 10-15      Treadmill   MPH 2    Grade 0    Minutes 15    METs 2.53      NuStep   Level 2    SPM 80    Minutes 15    METs 2.1      Home Exercise Plan   Plans to continue exercise at Home (comment)    Frequency Add 3 additional days to program exercise sessions.             Functional Capacity:  6 Minute Walk     Row Name 04/06/23 1457 06/25/23 1517       6 Minute Walk   Phase Initial Discharge    Distance 1050 feet 1200 feet    Distance Feet Change -- 150 ft    Walk Time 6 minutes 6 minutes    # of Rest Breaks 0 0    MPH 1.99 2.27    METS 2.54 2.71    RPE 13 12    Perceived Dyspnea  1 0  VO2 Peak 8.88 9.48    Symptoms Yes (comment) No    Comments R knee pain 3/10, SOB --    Resting HR 59 bpm 70 bpm    Resting BP 112/58 100/50    Resting Oxygen Saturation  99 % 96 %    Exercise Oxygen Saturation  during 6 min walk 99 % 96 %    Max Ex. HR 105 bpm 108 bpm    Max Ex. BP 128/74 112/50    2 Minute Post BP 108/56 110/60             Nutrition & Weight - Outcomes:  Pre Biometrics - 04/06/23 1500       Pre Biometrics   Height 5\' 4"  (1.626 m)    Weight 56.6 kg    Waist Circumference 28 inches    Hip Circumference 34 inches    Waist to Hip Ratio 0.82 %    BMI (Calculated) 21.41    Grip Strength 21.5 kg    Single Leg Stand 7.8 seconds             Post Biometrics - 06/25/23 1518        Post  Biometrics   Height 5\' 4"  (1.626 m)    Weight 56 kg    Waist Circumference 28 inches    Hip Circumference 32 inches    Waist to Hip Ratio 0.88 %    BMI (Calculated) 21.19    Grip Strength 21.8 kg    Single Leg Stand 15.26 seconds             Goals reviewed with patient; copy given to patient.

## 2023-07-09 NOTE — Progress Notes (Signed)
 Daily Session Note  Patient Details  Name: Cheryl [name] MRN: [id number] Date of Birth: 04/07/1950 Referring Provider:   Flowsheet Row CARDIAC REHAB PHASE II ORIENTATION from 04/06/2023 in [facility] C[facility]IAC REHABILITATION  Referring Provider Antoinette Batman MD  [Primary Cardiologist: Dr. Donata Fryer Nishan]       Encounter Date: 07/09/2023  Check In:  Session Check In - 07/09/23 1315       Check-In   Supervising physician immediately available to respond to emergencies See telemetry face sheet for immediately available MD    Location [facility]    Staff Present Ronna Coho BSN, RN;Heather Toy Freund, BS, Exercise Physiologist;Jessica Brighton, MA, RCEP, CCRP, CCET    Virtual Visit No    Medication changes reported     No    Fall or balance concerns reported    No    Tobacco Cessation No Change    Warm-up and Cool-down Performed on first and last piece of equipment    Resistance Training Performed Yes    VAD Patient? No    PAD/SET Patient? No      Pain Assessment   Currently in Pain? No/denies    Multiple Pain Sites No             Capillary Blood Glucose: No results found for this or any previous visit (from the past 24 hours).    Social History   Tobacco Use  Smoking Status Never   Passive exposure: Never  Smokeless Tobacco Not on file    Goals Met:  Independence with exercise equipment Exercise tolerated well No report of concerns or symptoms today Strength training completed today  Goals Unmet:  Not Applicable  Comments: .Cheryl [name] graduated today from  rehab with 36 sessions completed.  Details of the patient's exercise prescription and what She needs to do in order to continue the prescription and progress were discussed with patient.  Patient was given a copy of prescription and goals.  Patient verbalized understanding. Cheryl [name] plans to continue to exercise by exercising at home.

## 2023-07-14 ENCOUNTER — Encounter (HOSPITAL_COMMUNITY): Payer: Medicare HMO

## 2023-07-16 ENCOUNTER — Encounter (HOSPITAL_COMMUNITY): Payer: Medicare HMO

## 2023-07-21 ENCOUNTER — Encounter (HOSPITAL_COMMUNITY): Payer: Medicare HMO

## 2023-07-23 ENCOUNTER — Other Ambulatory Visit: Payer: Self-pay | Admitting: Nurse Practitioner

## 2023-07-23 ENCOUNTER — Encounter (HOSPITAL_COMMUNITY): Payer: Medicare HMO

## 2023-07-28 ENCOUNTER — Encounter (HOSPITAL_COMMUNITY): Payer: Medicare HMO

## 2023-07-30 ENCOUNTER — Encounter (HOSPITAL_COMMUNITY): Payer: Medicare HMO

## 2023-07-31 ENCOUNTER — Other Ambulatory Visit (HOSPITAL_COMMUNITY): Payer: Self-pay | Admitting: Endocrinology

## 2023-07-31 DIAGNOSIS — E279 Disorder of adrenal gland, unspecified: Secondary | ICD-10-CM

## 2023-08-04 ENCOUNTER — Encounter (HOSPITAL_COMMUNITY): Payer: Medicare HMO

## 2023-08-06 ENCOUNTER — Encounter (HOSPITAL_COMMUNITY): Payer: Medicare HMO

## 2023-08-10 ENCOUNTER — Other Ambulatory Visit (HOSPITAL_COMMUNITY): Payer: Self-pay

## 2023-08-11 ENCOUNTER — Encounter (HOSPITAL_COMMUNITY): Payer: Medicare HMO

## 2023-08-14 ENCOUNTER — Other Ambulatory Visit (HOSPITAL_COMMUNITY)

## 2023-08-14 LAB — HM MAMMOGRAPHY

## 2023-08-18 ENCOUNTER — Ambulatory Visit (HOSPITAL_COMMUNITY)

## 2023-08-31 ENCOUNTER — Ambulatory Visit: Payer: Self-pay | Admitting: Internal Medicine

## 2023-08-31 ENCOUNTER — Encounter: Payer: Self-pay | Admitting: Internal Medicine

## 2023-08-31 ENCOUNTER — Ambulatory Visit: Payer: Medicare HMO | Admitting: Internal Medicine

## 2023-08-31 VITALS — BP 126/68 | HR 63 | Temp 98.0°F | Resp 16 | Ht 64.0 in | Wt 122.2 lb

## 2023-08-31 DIAGNOSIS — E039 Hypothyroidism, unspecified: Secondary | ICD-10-CM | POA: Diagnosis not present

## 2023-08-31 DIAGNOSIS — E876 Hypokalemia: Secondary | ICD-10-CM | POA: Diagnosis not present

## 2023-08-31 DIAGNOSIS — I152 Hypertension secondary to endocrine disorders: Secondary | ICD-10-CM

## 2023-08-31 DIAGNOSIS — E785 Hyperlipidemia, unspecified: Secondary | ICD-10-CM

## 2023-08-31 DIAGNOSIS — K5900 Constipation, unspecified: Secondary | ICD-10-CM | POA: Insufficient documentation

## 2023-08-31 LAB — CK: Total CK: 106 U/L (ref 7–177)

## 2023-08-31 LAB — HEPATIC FUNCTION PANEL
ALT: 17 U/L (ref 0–35)
AST: 28 U/L (ref 0–37)
Albumin: 4.2 g/dL (ref 3.5–5.2)
Alkaline Phosphatase: 61 U/L (ref 39–117)
Bilirubin, Direct: 0.1 mg/dL (ref 0.0–0.3)
Total Bilirubin: 0.5 mg/dL (ref 0.2–1.2)
Total Protein: 7 g/dL (ref 6.0–8.3)

## 2023-08-31 LAB — LIPID PANEL
Cholesterol: 128 mg/dL (ref 0–200)
HDL: 60.3 mg/dL (ref >39.00)
LDL Cholesterol: 58 mg/dL (ref 0–99)
NonHDL: 67.41
Total CHOL/HDL Ratio: 2
Triglycerides: 46 mg/dL (ref 0.0–149.0)
VLDL: 9.2 mg/dL (ref 0.0–40.0)

## 2023-08-31 MED ORDER — POTASSIUM CHLORIDE CRYS ER 20 MEQ PO TBCR: 20.0000 meq | EXTENDED_RELEASE_TABLET | Freq: Every day | ORAL | 1 refills | Status: DC

## 2023-08-31 MED ORDER — SYNTHROID 50 MCG PO TABS: 50.0000 ug | ORAL_TABLET | Freq: Every day | ORAL | 1 refills | Status: DC

## 2023-08-31 NOTE — Patient Instructions (Signed)

## 2023-08-31 NOTE — Progress Notes (Signed)
 Subjective:  Patient ID: Cheryl [name], female    DOB: Feb 24, 1951  Age: 73 y.o. MRN: [id number]  CC: Medical Management of Chronic Issues (6 month follow up. Concerns that she would rather discuss with Dr. Joshua directly ) and Fatigue   HPI Cheryl [name] presents for f/up ----  Discussed the use of AI scribe software for clinical note transcription with the patient, who gave verbal consent to proceed.  History of Present Illness   The patient presents with fatigue and lack of energy following surgery six months ago.  The patient has been experiencing significant fatigue and lack of energy for the past six months, which they associate with a surgery they underwent. The fatigue is persistent and more pronounced since the surgery. They also experience intermittent dizziness. No chest pain, shortness of breath, weight changes, appetite changes, or swelling in the legs or feet.  They are currently taking Synthroid  and require a refill, as well as potassium. They mention a past prescription error involving rosuvastatin , which they did not take, as they are currently on atorvastatin . They are concerned about potential side effects of their medications, particularly in relation to their low blood pressure and fatigue. They are on amlodipine , which was reduced from 5 mg to 2.5 mg by a previous provider.  During cardiac rehab, their diastolic blood pressure readings were often in the fifties or forties, based on their recollection. They deny any symptoms associated with low blood pressure, such as feeling faint or on the verge of passing out. Their current blood pressure is 126/68, which is within normal limits.  They mention an upcoming CT scan to evaluate a nodule on an adrenal gland, identified years ago. Their new endocrinologist suggested this follow-up. No muscle or joint aches, except for some pain when taking deep breaths.       Outpatient Medications Prior to Visit  Medication Sig  Dispense Refill   alendronate (FOSAMAX) 70 MG tablet Take 70 mg by mouth once a week. Take with a full glass of water on an empty stomach.     amLODipine  (NORVASC ) 2.5 MG tablet TAKE 1 TABLET BY MOUTH EVERY DAY 90 tablet 2   Apoaequorin (PREVAGEN PO) Take 1 tablet by mouth daily.     aspirin  EC 81 MG tablet Take 1 tablet (81 mg total) by mouth daily. Swallow whole. 90 tablet 2   atorvastatin  (LIPITOR) 80 MG tablet Take 1 tablet (80 mg total) by mouth daily. 30 tablet 11   bimatoprost (LUMIGAN) 0.01 % SOLN Place 1 drop into both eyes at bedtime.     BIOTIN PO Take 1 tablet by mouth daily.     budesonide-formoterol (SYMBICORT) 80-4.5 MCG/ACT inhaler Inhale 2 puffs into the lungs daily as needed (for shortness of breath).     clopidogrel  (PLAVIX ) 75 MG tablet Take 1 tablet (75 mg total) by mouth daily. 90 tablet 2   nitroGLYCERIN  (NITROSTAT ) 0.4 MG SL tablet Place 1 tablet (0.4 mg total) under the tongue every 5 (five) minutes as needed. 25 tablet 2   Omega-3 Fatty Acids (FISH OIL PO) Take 1 capsule by mouth daily.     spironolactone  (ALDACTONE ) 100 MG tablet Take 0.5 tablets (50 mg total) by mouth daily.     thiamine  (VITAMIN B1) 100 MG tablet Take 1 tablet (100 mg total) by mouth daily. 90 tablet 1   potassium chloride  SA (KLOR-CON  M20) 20 MEQ tablet Take 1 tablet (20 mEq total) by mouth daily. 90 tablet 1   SYNTHROID  50  MCG tablet Take 50 mcg by mouth every morning.     No facility-administered medications prior to visit.    ROS Review of Systems  Constitutional:  Positive for fatigue. Negative for appetite change, diaphoresis and unexpected weight change.  HENT: Negative.  Negative for sore throat and trouble swallowing.   Eyes: Negative.   Respiratory:  Negative for cough, chest tightness, shortness of breath and wheezing.   Cardiovascular:  Negative for chest pain, palpitations and leg swelling.  Gastrointestinal:  Negative for abdominal pain, constipation, diarrhea, nausea and  vomiting.  Endocrine: Negative.   Genitourinary: Negative.  Negative for difficulty urinating.  Musculoskeletal:  Negative for arthralgias and myalgias.  Skin: Negative.   Neurological:  Positive for dizziness.  Hematological:  Negative for adenopathy. Does not bruise/bleed easily.  Psychiatric/Behavioral: Negative.      Objective:  BP 126/68 (BP Location: Left Arm, Patient Position: Sitting, Cuff Size: Normal)   Pulse 63   Temp 98 F (36.7 C) (Oral)   Resp 16   Ht 5' 4 (1.626 m)   Wt 122 lb 3.2 oz (55.4 kg)   SpO2 96%   BMI 20.98 kg/m   BP Readings from Last 3 Encounters:  08/31/23 126/68  07/01/23 (!) 122/58  06/30/23 110/68    Wt Readings from Last 3 Encounters:  08/31/23 122 lb 3.2 oz (55.4 kg)  07/01/23 123 lb 6.4 oz (56 kg)  06/30/23 122 lb 6.4 oz (55.5 kg)    Physical Exam Vitals reviewed.  Constitutional:      General: She is not in acute distress.    Appearance: She is not toxic-appearing or diaphoretic.  HENT:     Mouth/Throat:     Mouth: Mucous membranes are moist.  Eyes:     General: No scleral icterus.    Conjunctiva/sclera: Conjunctivae normal.  Cardiovascular:     Rate and Rhythm: Normal rate and regular rhythm.     Heart sounds: No murmur heard.    No friction rub. No gallop.  Pulmonary:     Effort: Pulmonary effort is normal.     Breath sounds: No stridor. No wheezing, rhonchi or rales.  Abdominal:     General: Abdomen is flat.     Palpations: There is no mass.     Tenderness: There is no abdominal tenderness. There is no guarding.     Hernia: No hernia is present.  Musculoskeletal:        General: Normal range of motion.     Cervical back: Neck supple.     Right lower leg: No edema.     Left lower leg: No edema.  Lymphadenopathy:     Cervical: No cervical adenopathy.  Skin:    General: Skin is warm and dry.  Neurological:     General: No focal deficit present.     Mental Status: She is alert. Mental status is at baseline.   Psychiatric:        Mood and Affect: Mood normal.     Lab Results  Component Value Date   WBC 6.0 07/01/2023   HGB 10.7 (L) 07/01/2023   HCT 32.6 (L) 07/01/2023   PLT 252.0 07/01/2023   GLUCOSE 95 07/01/2023   CHOL 128 08/31/2023   TRIG 46.0 08/31/2023   HDL 60.30 08/31/2023   LDLCALC 58 08/31/2023   ALT 17 08/31/2023   AST 28 08/31/2023   NA 140 07/01/2023   K 4.7 07/01/2023   CL 107 07/01/2023   CREATININE 1.69 (H) 07/01/2023  BUN 39 (H) 07/01/2023   CO2 25 07/01/2023   TSH 2.50 07/01/2023    No results found.  Assessment & Plan:  Hypertension due to endocrine disorder- BP is well controlled. -     Hepatic function panel; Future  Hypothyroidism, unspecified type- She is euthyroid. -     Synthroid ; Take 1 tablet (50 mcg total) by mouth daily before breakfast.  Dispense: 90 tablet; Refill: 1 -     Hepatic function panel; Future  Hyperlipidemia LDL goal <70- LDL goal achieved. Doing well on the statin  -     Lipid panel; Future -     CK; Future  Hypokalemia -     Potassium Chloride  Crys ER; Take 1 tablet (20 mEq total) by mouth daily.  Dispense: 90 tablet; Refill: 1     Follow-up: Return in about 6 months (around 03/02/2024).  Debby Molt, MD

## 2023-09-04 ENCOUNTER — Ambulatory Visit (HOSPITAL_COMMUNITY)

## 2023-09-14 ENCOUNTER — Other Ambulatory Visit (HOSPITAL_COMMUNITY): Payer: Self-pay

## 2023-09-18 ENCOUNTER — Ambulatory Visit (HOSPITAL_COMMUNITY)

## 2023-09-18 ENCOUNTER — Encounter (HOSPITAL_COMMUNITY): Payer: Self-pay

## 2023-10-15 ENCOUNTER — Encounter (HOSPITAL_COMMUNITY): Payer: Self-pay | Admitting: Emergency Medicine

## 2023-10-15 ENCOUNTER — Emergency Department (HOSPITAL_COMMUNITY)

## 2023-10-15 ENCOUNTER — Emergency Department (HOSPITAL_COMMUNITY)
Admission: EM | Admit: 2023-10-15 | Discharge: 2023-10-15 | Disposition: A | Discharge status: Home / Self Care | Attending: Emergency Medicine | Admitting: Emergency Medicine

## 2023-10-15 ENCOUNTER — Other Ambulatory Visit: Payer: Self-pay

## 2023-10-15 DIAGNOSIS — I2510 Atherosclerotic heart disease of native coronary artery without angina pectoris: Secondary | ICD-10-CM | POA: Insufficient documentation

## 2023-10-15 DIAGNOSIS — Z79899 Other long term (current) drug therapy: Secondary | ICD-10-CM | POA: Diagnosis not present

## 2023-10-15 DIAGNOSIS — R55 Syncope and collapse: Secondary | ICD-10-CM | POA: Insufficient documentation

## 2023-10-15 DIAGNOSIS — Z7982 Long term (current) use of aspirin: Secondary | ICD-10-CM | POA: Diagnosis not present

## 2023-10-15 DIAGNOSIS — Z7902 Long term (current) use of antithrombotics/antiplatelets: Secondary | ICD-10-CM | POA: Insufficient documentation

## 2023-10-15 LAB — URINALYSIS, ROUTINE W REFLEX MICROSCOPIC
Bacteria, UA: NONE SEEN
Bilirubin Urine: NEGATIVE
Glucose, UA: NEGATIVE mg/dL
Hgb urine dipstick: NEGATIVE
Ketones, ur: NEGATIVE mg/dL
Nitrite: NEGATIVE
Protein, ur: NEGATIVE mg/dL
Specific Gravity, Urine: 1.008 (ref 1.005–1.030)
pH: 7 (ref 5.0–8.0)

## 2023-10-15 LAB — COMPREHENSIVE METABOLIC PANEL WITH GFR
ALT: 26 U/L (ref 0–44)
AST: 38 U/L (ref 15–41)
Albumin: 3.5 g/dL (ref 3.5–5.0)
Alkaline Phosphatase: 66 U/L (ref 38–126)
Anion gap: 10 (ref 5–15)
BUN: 31 mg/dL — ABNORMAL HIGH (ref 8–23)
CO2: 24 mmol/L (ref 22–32)
Calcium: 10.1 mg/dL (ref 8.9–10.3)
Chloride: 107 mmol/L (ref 98–111)
Creatinine, Ser: 1.69 mg/dL — ABNORMAL HIGH (ref 0.44–1.00)
GFR, Estimated: 31 mL/min — ABNORMAL LOW (ref >60)
Glucose, Bld: 97 mg/dL (ref 70–99)
Potassium: 4.4 mmol/L (ref 3.5–5.1)
Sodium: 141 mmol/L (ref 135–145)
Total Bilirubin: 0.7 mg/dL (ref 0.0–1.2)
Total Protein: 6.3 g/dL — ABNORMAL LOW (ref 6.5–8.1)

## 2023-10-15 LAB — TROPONIN I (HIGH SENSITIVITY)
Troponin I (High Sensitivity): 6 ng/L (ref <18)
Troponin I (High Sensitivity): 8 ng/L (ref <18)

## 2023-10-15 LAB — CBC
HCT: 29.5 % — ABNORMAL LOW (ref 36.0–46.0)
Hemoglobin: 9.6 g/dL — ABNORMAL LOW (ref 12.0–15.0)
MCH: 31.6 pg (ref 26.0–34.0)
MCHC: 32.5 g/dL (ref 30.0–36.0)
MCV: 97 fL (ref 80.0–100.0)
Platelets: 207 K/uL (ref 150–400)
RBC: 3.04 MIL/uL — ABNORMAL LOW (ref 3.87–5.11)
RDW: 14.4 % (ref 11.5–15.5)
WBC: 6.5 K/uL (ref 4.0–10.5)
nRBC: 0 % (ref 0.0–0.2)

## 2023-10-15 LAB — D-DIMER, QUANTITATIVE: D-Dimer, Quant: 2.15 ug{FEU}/mL — ABNORMAL HIGH (ref 0.00–0.50)

## 2023-10-15 LAB — CBG MONITORING, ED: Glucose-Capillary: 95 mg/dL (ref 70–99)

## 2023-10-15 MED ORDER — SODIUM CHLORIDE 0.9 % IV BOLUS
1000.0000 mL | Freq: Once | INTRAVENOUS | Status: AC
  Administered 2023-10-15: 1000 mL via INTRAVENOUS

## 2023-10-15 MED ORDER — IOHEXOL 350 MG/ML SOLN
60.0000 mL | Freq: Once | INTRAVENOUS | Status: AC | PRN
  Administered 2023-10-15: 60 mL via INTRAVENOUS

## 2023-10-15 NOTE — ED Triage Notes (Signed)
 Pt had cardiac stent placed 01/25 and has been having episodes of dizziness since. Was at check out at store and became dizzy and had syncopal episode. Per ems arrival pt was up and a/o. Pt arrived to ED a/o. Denies pain from fall. Pupils perrla and NIH negative upon arrival. Pt states is not dizzy at present not when Im seated.

## 2023-10-15 NOTE — ED Notes (Signed)
 Palpated spine, neck and bony prominences. Pt denies any pain. No C-collar per provider.

## 2023-10-15 NOTE — ED Notes (Signed)
 Patient transported to CT

## 2023-10-15 NOTE — Discharge Instructions (Signed)
 Stay hydrated follow-up with your family doctor or your cardiologist next week.  Return if any problem

## 2023-10-15 NOTE — ED Provider Notes (Signed)
 [facility] DEPARTMENT AT [facility] Provider Note   CSN: [id number] Arrival date & time: 10/15/23  1622     Patient presents with: Loss of Consciousness   Ailana [name] is a 73 y.o. female.  {Add pertinent medical, surgical, social history, OB history to YEP:67052} Patient has a history of coronary artery disease.  She had a stent placed last January.  She has been having problems with dizziness ever since then.  Patient had some dizziness today and passed out in the store.  Patient feels back to normal now   Loss of Consciousness      Prior to Admission medications   Medication Sig Start Date End Date Taking? Authorizing Provider  acetaminophen  (TYLENOL ) 500 MG tablet Take 500 mg by mouth every 6 (six) hours as needed for mild pain (pain score 1-3).   Yes [provider]  amLODipine  (NORVASC ) 2.5 MG tablet TAKE 1 TABLET BY MOUTH EVERY DAY 07/23/23  Yes Nishan, Peter C, MD  Apoaequorin (PREVAGEN PO) Take 1 tablet by mouth daily.   Yes [provider]  aspirin  EC 81 MG tablet Take 1 tablet (81 mg total) by mouth daily. Swallow whole. 03/20/23  Yes Henry Manuelita NOVAK, NP  atorvastatin  (LIPITOR) 80 MG tablet Take 1 tablet (80 mg total) by mouth daily. 03/19/23 03/18/24 Yes Henry Manuelita B, NP  bimatoprost (LUMIGAN) 0.01 % SOLN Place 1 drop into both eyes at bedtime.   Yes [provider]  BIOTIN PO Take 1 tablet by mouth daily.   Yes [provider]  budesonide-formoterol (SYMBICORT) 80-4.5 MCG/ACT inhaler Inhale 2 puffs into the lungs daily as needed (for shortness of breath).   Yes [provider]  clopidogrel  (PLAVIX ) 75 MG tablet Take 1 tablet (75 mg total) by mouth daily. 03/19/23  Yes Henry Manuelita NOVAK, NP  Homeopathic Products [facility] MUSCLE CRAMPS EX) Apply 1 Application topically daily.   Yes [provider]  nitroGLYCERIN  (NITROSTAT ) 0.4 MG SL tablet Place 1 tablet (0.4 mg total) under the tongue  every 5 (five) minutes as needed. Patient taking differently: Place 0.4 mg under the tongue every 5 (five) minutes as needed for chest pain. 03/19/23  Yes Henry Manuelita NOVAK, NP  Omega-3 Fatty Acids (FISH OIL PO) Take 1 capsule by mouth daily.   Yes [provider]  potassium chloride  SA (KLOR-CON  M20) 20 MEQ tablet Take 1 tablet (20 mEq total) by mouth daily. 08/31/23  Yes Joshua Debby CROME, MD  spironolactone  (ALDACTONE ) 25 MG tablet Take 25 mg by mouth daily. 09/01/23  Yes [provider]  SYNTHROID  50 MCG tablet Take 1 tablet (50 mcg total) by mouth daily before breakfast. 08/31/23  Yes Joshua Debby CROME, MD  thiamine  (VITAMIN B1) 100 MG tablet Take 1 tablet (100 mg total) by mouth daily. 03/03/23  Yes Joshua Debby CROME, MD    Allergies: Erythromycin and Penicillins    Review of Systems  Cardiovascular:  Positive for syncope.    Updated Vital Signs BP (!) 147/75   Pulse 63   Temp 98.1 F (36.7 C) (Oral)   Resp 20   SpO2 99%   Physical Exam  (all labs ordered are listed, but only abnormal results are displayed) Labs Reviewed  COMPREHENSIVE METABOLIC PANEL WITH GFR - Abnormal; Notable for the following components:      Result Value   BUN 31 (*)    Creatinine, Ser 1.69 (*)    Total Protein 6.3 (*)    GFR, Estimated 31 (*)  All other components within normal limits  CBC - Abnormal; Notable for the following components:   RBC 3.04 (*)    Hemoglobin 9.6 (*)    HCT 29.5 (*)    All other components within normal limits  URINALYSIS, ROUTINE W REFLEX MICROSCOPIC - Abnormal; Notable for the following components:   Color, Urine STRAW (*)    Leukocytes,Ua SMALL (*)    All other components within normal limits  D-DIMER, QUANTITATIVE - Abnormal; Notable for the following components:   D-Dimer, Quant 2.15 (*)    All other components within normal limits  CBG MONITORING, ED  CBG MONITORING, ED  TROPONIN I (HIGH SENSITIVITY)  TROPONIN I (HIGH SENSITIVITY)    EKG: EKG  Interpretation Date/Time:  Thursday October 15 2023 17:16:26 EDT Ventricular Rate:  66 PR Interval:  174 QRS Duration:  96 QT Interval:  435 QTC Calculation: 456 R Axis:   37  Text Interpretation: Sinus rhythm Ventricular premature complex Nonspecific T abnormalities, diffuse leads Confirmed by Suzette Pac [phone]) on 10/15/2023 10:24:38 PM  Radiology: CT Angio Chest PE W and/or Wo Contrast Result Date: 10/15/2023 CLINICAL DATA:  Chronic dyspnea dizziness syncope EXAM: CT ANGIOGRAPHY CHEST WITH CONTRAST TECHNIQUE: Multidetector CT imaging of the chest was performed using the standard protocol during bolus administration of intravenous contrast. Multiplanar CT image reconstructions and MIPs were obtained to evaluate the vascular anatomy. RADIATION DOSE REDUCTION: This exam was performed according to the departmental dose-optimization program which includes automated exposure control, adjustment of the mA and/or kV according to patient size and/or use of iterative reconstruction technique. CONTRAST:  60mL OMNIPAQUE  IOHEXOL  350 MG/ML SOLN COMPARISON:  Chest x-ray 10/15/2023, chest CT 04/03/2021 FINDINGS: Cardiovascular: Satisfactory opacification of the pulmonary arteries to the segmental level. No evidence of pulmonary embolism. Moderate aortic atherosclerosis. No aneurysm. Prominent appearing pulmonary trunk and main pulmonary arteries. Multi vessel coronary vascular calcification. Borderline to mild cardiomegaly. No pericardial effusion. Mediastinum/Nodes: Patent trachea. No suspicious thyroid  mass. No suspicious lymph nodes. Esophagus within normal limits. Calcified mediastinal and hilar nodes consistent with prior granulomatous disease. Lungs/Pleura: Bronchiectasis in the bilateral lower lobes with scarring and subpleural cystic change. No acute airspace disease, pleural effusion or pneumothorax. Upper Abdomen: No acute finding. Partially visualized left adrenal nodule measuring 15 mm, stable,  probably an adenoma given lack of interval change, no specific imaging follow-up is recommended. Musculoskeletal: No acute osseous abnormality. Review of the MIP images confirms the above findings. IMPRESSION: 1. Negative for acute pulmonary embolus. 2. Bronchiectasis in the bilateral lower lobes with scarring and subpleural cystic change. No acute airspace disease. 3. Borderline to mild cardiomegaly. Slightly prominent appearing pulmonary trunk and main pulmonary vessels suggesting arterial hypertension. 4. Aortic atherosclerosis. Aortic Atherosclerosis (ICD10-I70.0). Electronically Signed   By: Luke Bun M.D.   On: 10/15/2023 20:35   CT Head Wo Contrast Result Date: 10/15/2023 CLINICAL DATA:  Neuro deficit, acute, stroke suspected Dizziness. EXAM: CT HEAD WITHOUT CONTRAST TECHNIQUE: Contiguous axial images were obtained from the base of the skull through the vertex without intravenous contrast. RADIATION DOSE REDUCTION: This exam was performed according to the departmental dose-optimization program which includes automated exposure control, adjustment of the mA and/or kV according to patient size and/or use of iterative reconstruction technique. COMPARISON:  None Available. FINDINGS: Brain: No intracranial hemorrhage, mass effect, or midline shift. Age related atrophy. No hydrocephalus. The basilar cisterns are patent. No evidence of territorial infarct or acute ischemia. No extra-axial or intracranial fluid collection. Vascular: Atherosclerosis of skullbase vasculature without hyperdense vessel  or abnormal calcification. Skull: No fracture or focal lesion. Sinuses/Orbits: Paranasal sinuses and mastoid air cells are clear. The visualized orbits are unremarkable. Other: None. IMPRESSION: 1. No acute intracranial abnormality. 2. Age related atrophy. Electronically Signed   By: Andrea Gasman M.D.   On: 10/15/2023 18:45    {Document cardiac monitor, telemetry assessment procedure when  appropriate:32947} Procedures   Medications Ordered in the ED  sodium chloride  0.9 % bolus 1,000 mL (0 mLs Intravenous Stopped 10/15/23 1858)  iohexol  (OMNIPAQUE ) 350 MG/ML injection 60 mL (60 mLs Intravenous Contrast Given 10/15/23 2012)      {Click here for ABCD2, HEART and other calculators REFRESH Note before signing:1}                              Medical Decision Making Amount and/or Complexity of Data Reviewed Labs: ordered. Radiology: ordered.  Risk Prescription drug management.   Syncope  {Document critical care time when appropriate  Document review of labs and clinical decision tools ie CHADS2VASC2, etc  Document your independent review of radiology images and any outside records  Document your discussion with family members, caretakers and with consultants  Document social determinants of health affecting pt's care  Document your decision making why or why not admission, treatments were needed:32947:::1}   Final diagnoses:  Syncope and collapse    ED Discharge Orders     None

## 2023-10-15 NOTE — ED Notes (Signed)
 Pt ambulated to the bathroom with assistance

## 2023-10-16 ENCOUNTER — Ambulatory Visit (INDEPENDENT_AMBULATORY_CARE_PROVIDER_SITE_OTHER)

## 2023-10-16 VITALS — Ht 64.0 in | Wt 122.0 lb

## 2023-10-16 DIAGNOSIS — Z0000 Encounter for general adult medical examination without abnormal findings: Secondary | ICD-10-CM

## 2023-10-16 NOTE — Progress Notes (Signed)
 Subjective:  Please attest and cosign this visit due to patients primary care provider not being in the office at the time the visit was completed.  (Pt of Dr. Debby Molt)   Cheryl [name] is a 73 y.o. who presents for a Medicare Wellness preventive visit.  As a reminder, Annual Wellness Visits don't include a physical exam, and some assessments may be limited, especially if this visit is performed virtually. We may recommend an in-person follow-up visit with your provider if needed.  Visit Complete: Virtual I connected with  Syeda Demos on 10/16/23 by a audio enabled telemedicine application and verified that I am speaking with the correct person using two identifiers.  Patient Location: Home  Provider Location: Office/Clinic  I discussed the limitations of evaluation and management by telemedicine. The patient expressed understanding and agreed to proceed.  Vital Signs: Because this visit was a virtual/telehealth visit, some criteria may be missing or patient reported. Any vitals not documented were not able to be obtained and vitals that have been documented are patient reported.  VideoDeclined- This patient declined Librarian, academic. Therefore the visit was completed with audio only.  Persons Participating in Visit: Patient.  AWV Questionnaire: No: Patient Medicare AWV questionnaire was not completed prior to this visit.  Cardiac Risk Factors include: advanced age (>27men, >46 women);dyslipidemia;hypertension     Objective:    Today's Vitals   10/16/23 1055  Weight: 122 lb (55.3 kg)  Height: 5' 4 (1.626 m)  PainSc: 0-No pain   Body mass index is 20.94 kg/m.     10/16/2023   10:54 AM 04/06/2023    1:16 PM 03/18/2023    6:54 AM 10/26/2022    1:50 PM 10/10/2022   10:20 AM 07/29/2021   10:15 AM 10/09/2016   10:32 AM  Advanced Directives  Does Patient Have a Medical Advance Directive? No No Yes No Yes No No   Type of Aeronautical engineer of [place];Living will  Living will    Would patient like information on creating a medical advance directive? No - Patient declined Yes (MAU/Ambulatory/Procedural Areas - Information given)    No - Patient declined Yes (MAU/Ambulatory/Procedural Areas - Information given)      Data saved with a previous flowsheet row definition    Current Medications (verified) Outpatient Encounter Medications as of 10/16/2023  Medication Sig   acetaminophen  (TYLENOL ) 500 MG tablet Take 500 mg by mouth every 6 (six) hours as needed for mild pain (pain score 1-3).   amLODipine  (NORVASC ) 2.5 MG tablet TAKE 1 TABLET BY MOUTH EVERY DAY   Apoaequorin (PREVAGEN PO) Take 1 tablet by mouth daily.   aspirin  EC 81 MG tablet Take 1 tablet (81 mg total) by mouth daily. Swallow whole.   atorvastatin  (LIPITOR) 80 MG tablet Take 1 tablet (80 mg total) by mouth daily.   bimatoprost (LUMIGAN) 0.01 % SOLN Place 1 drop into both eyes at bedtime.   BIOTIN PO Take 1 tablet by mouth daily.   budesonide-formoterol (SYMBICORT) 80-4.5 MCG/ACT inhaler Inhale 2 puffs into the lungs daily as needed (for shortness of breath).   clopidogrel  (PLAVIX ) 75 MG tablet Take 1 tablet (75 mg total) by mouth daily.   Homeopathic Products [facility] MUSCLE CRAMPS EX) Apply 1 Application topically daily.   nitroGLYCERIN  (NITROSTAT ) 0.4 MG SL tablet Place 1 tablet (0.4 mg total) under the tongue every 5 (five) minutes as needed.   Omega-3 Fatty Acids (FISH OIL PO) Take 1 capsule  by mouth daily.   potassium chloride  SA (KLOR-CON  M20) 20 MEQ tablet Take 1 tablet (20 mEq total) by mouth daily.   spironolactone  (ALDACTONE ) 25 MG tablet Take 25 mg by mouth daily.   SYNTHROID  50 MCG tablet Take 1 tablet (50 mcg total) by mouth daily before breakfast.   thiamine  (VITAMIN B1) 100 MG tablet Take 1 tablet (100 mg total) by mouth daily.   No facility-administered encounter medications on file as of 10/16/2023.    Allergies  (verified) Erythromycin and Penicillins   History: Past Medical History:  Diagnosis Date   AAA (abdominal aortic aneurysm) (HCC)    Arthritis    osteoarthritis   Hyperlipidemia    Hypertension    Hypoaldosteronism (HCC)    Renal disorder    renal insufficiency   Sarcoidosis    Thyroid  disease    hypothyroidism   Past Surgical History:  Procedure Laterality Date   ABDOMINAL AORTIC ANEURYSM REPAIR     BREAST BIOPSY     CORONARY ANGIOGRAPHY N/A 03/18/2023   Procedure: CORONARY ANGIOGRAPHY;  Surgeon: Verlin Lonni BIRCH, MD;  Location: [facility] INVASIVE CV LAB;  Service: Cardiovascular;  Laterality: N/A;   CORONARY STENT INTERVENTION N/A 03/19/2023   Procedure: CORONARY STENT INTERVENTION;  Surgeon: [place], Peter M, MD;  Location: [facility] INVASIVE CV LAB;  Service: Cardiovascular;  Laterality: N/A;   RENAL BIOPSY     renal vein sampling     History reviewed. No pertinent family history. Social History   Socioeconomic History   Marital status: Married    Spouse name: Not on file   Number of children: Not on file   Years of education: Not on file   Highest education level: Master's degree (e.g., MA, MS, MEng, MEd, MSW, MBA)  Occupational History   Not on file  Tobacco Use   Smoking status: Never    Passive exposure: Never   Smokeless tobacco: Not on file  Substance and Sexual Activity   Alcohol use: No   Drug use: No   Sexual activity: Yes  Other Topics Concern   Not on file  Social History Narrative   Married   Social Drivers of Health   Financial Resource Strain: Low Risk  (10/16/2023)   Overall Financial Resource Strain (CARDIA)    Difficulty of Paying Living Expenses: Not hard at all  Food Insecurity: No Food Insecurity (10/16/2023)   Hunger Vital Sign    Worried About Running Out of Food in the Last Year: Never true    Ran Out of Food in the Last Year: Never true  Transportation Needs: No Transportation Needs (10/16/2023)   PRAPARE - Scientist, research (physical sciences) (Medical): No    Lack of Transportation (Non-Medical): No  Physical Activity: Inactive (10/16/2023)   Exercise Vital Sign    Days of Exercise per Week: 0 days    Minutes of Exercise per Session: 0 min  Stress: No Stress Concern Present (10/16/2023)   [employer] of Occupational Health - Occupational Stress Questionnaire    Feeling of Stress: Only a little  Social Connections: Socially Integrated (10/16/2023)   Social Connection and Isolation Panel    Frequency of Communication with Friends and Family: Twice a week    Frequency of Social Gatherings with Friends and Family: Twice a week    Attends Religious Services: More than 4 times per year    Active Member of [employer] or Organizations: Yes    Attends Banker Meetings: 1 to 4 times per  year    Marital Status: Married    Tobacco Counseling Counseling given: Not Answered    Clinical Intake:  Pre-visit preparation completed: Yes  Pain : No/denies pain Pain Score: 0-No pain     BMI - recorded: 20.94 Nutritional Status: BMI of 19-24  Normal Nutritional Risks: None Diabetes: No  No results found for: HGBA1C   How often do you need to have someone help you when you read instructions, pamphlets, or other written materials from your doctor or pharmacy?: 1 - Never  Interpreter Needed?: No  Information entered by :: Verdie Saba, CMA   Activities of Daily Living     10/16/2023   10:58 AM 10/16/2023    2:18 AM  In your present state of health, do you have any difficulty performing the following activities:  Hearing? 0 0  Vision? 0 0  Difficulty concentrating or making decisions? 0 0  Walking or climbing stairs? 0   Dressing or bathing? 0 0  Doing errands, shopping? 0 0  Preparing Food and eating ? N N  Using the Toilet? N N  In the past six months, have you accidently leaked urine? CINDERELLA CINDERELLA  Comment wears a pantyliner   Do you have problems with loss of bowel control? N N  Managing your  Medications? N N  Managing your Finances? N N  Housekeeping or managing your Housekeeping? N N    Patient Care Team: Joshua Debby CROME, MD as PCP - General (Internal Medicine) Delford Maude BROCKS, MD as PCP - Cardiology (Cardiology) Merceda Lela SAUNDERS, [facility] as Pharmacist (Pharmacist) [facility] (Ophthalmology)  I have updated your Care Teams any recent Medical Services you may have received from other providers in the past year.     Assessment:   This is a routine wellness examination for Cheryl [name].  Hearing/Vision screen Hearing Screening - Comments:: Denies hearing difficulties   Vision Screening - Comments:: Denies vision concerns - up-to-date with eye exams with [facility]   Goals Addressed               This Visit's Progress     Patient Stated (pt-stated)        Patient stated she plans to exercise more       Depression Screen     10/16/2023   10:59 AM 08/31/2023   11:03 AM 07/09/2023    2:16 PM 07/01/2023   11:22 AM 04/06/2023    1:21 PM 10/10/2022   10:18 AM 10/03/2022    2:13 PM  PHQ 2/9 Scores  PHQ - 2 Score 0 0 2 1 2 1 1   PHQ- 9 Score 0  7 6 8       Fall Risk     10/16/2023   10:58 AM 10/16/2023    2:18 AM 08/31/2023   11:03 AM 07/01/2023   11:22 AM 04/06/2023    1:17 PM  Fall Risk   Falls in the past year? 1 1 0 0 1  Number falls in past yr: 0 0 0 0 0  Comment 1 on 10/15/23      Injury with Fall? 0 1 0 0 0  Risk for fall due to :   No Fall Risks No Fall Risks History of fall(s);Impaired balance/gait;Medication side effect  Follow up Falls evaluation completed;Falls prevention discussed  Falls evaluation completed Falls evaluation completed Education provided;Falls prevention discussed    MEDICARE RISK AT HOME:  Medicare Risk at Home Any stairs in or around the home?:  Yes (outside) If so, are there any without handrails?: No Home free of loose throw rugs in walkways, pet beds, electrical cords, etc?: No Adequate lighting in your home to  reduce risk of falls?: Yes Life alert?: No Use of a cane, walker or w/c?: No Grab bars in the bathroom?: Yes Shower chair or bench in shower?: Yes Elevated toilet seat or a handicapped toilet?: Yes  TIMED UP AND GO:  Was the test performed?  No  Cognitive Function: 6CIT completed        10/16/2023   11:01 AM 10/10/2022   10:20 AM  6CIT Screen  What Year? 0 points 0 points  What month? 0 points 0 points  What time? 0 points 0 points  Count back from 20 0 points 0 points  Months in reverse 0 points 0 points  Repeat phrase 0 points 0 points  Total Score 0 points 0 points    Immunizations Immunization History  Administered Date(s) Administered   Fluad Trivalent(High Dose 65+) 11/24/2022   Influenza Inj Mdck Quad Pf 12/31/2015   Influenza, High Dose Seasonal PF 12/31/2016, 01/25/2018, 03/18/2019, 04/17/2021   Influenza, Mdck, Trivalent,PF 6+ MOS(egg free) 12/31/2015   Influenza-Unspecified 12/24/2021   Moderna Covid-19 Fall Seasonal Vaccine 34yrs & older 12/09/2022   Moderna SARS-COV2 Booster Vaccination 01/12/2020   Moderna Sars-Covid-2 Vaccination 04/02/2019, 05/03/2019, 12/26/2019   PFIZER(Purple Top)SARS-COV-2 Vaccination 04/17/2021   Pneumococcal Conjugate-13 11/08/2014   Pneumococcal Polysaccharide-23 12/31/2016, 01/04/2018, 01/25/2018, 03/18/2019, 04/17/2021   Zoster Recombinant(Shingrix) 11/23/2017, 03/26/2018    Screening Tests Health Maintenance  Topic Date Due   DTaP/Tdap/Td (1 - Tdap) Never done   COVID-19 Vaccine (7 - 2024-25 season) 06/09/2023   INFLUENZA VACCINE  09/25/2023   Medicare Annual Wellness (AWV)  10/15/2024   Fecal DNA (Cologuard)  04/27/2026   Pneumococcal Vaccine: 50+ Years  Completed   DEXA SCAN  Completed   Hepatitis C Screening  Completed   Zoster Vaccines- Shingrix  Completed   HPV VACCINES  Aged Out   Meningococcal B Vaccine  Aged Out    Health Maintenance  Health Maintenance Due  Topic Date Due   DTaP/Tdap/Td (1 - Tdap) Never  done   COVID-19 Vaccine (7 - 2024-25 season) 06/09/2023   INFLUENZA VACCINE  09/25/2023   Health Maintenance Items Addressed: 10/16/2023  Additional Screening:  Vision Screening: Recommended annual ophthalmology exams for early detection of glaucoma and other disorders of the eye. Would you like a referral to an eye doctor? No    Dental Screening: Recommended annual dental exams for proper oral hygiene  Community Resource Referral / Chronic Care Management: CRR required this visit?  No   CCM required this visit?  No   Plan:    I have personally reviewed and noted the following in the patient's chart:   Medical and social history Use of alcohol, tobacco or illicit drugs  Current medications and supplements including opioid prescriptions. Patient is not currently taking opioid prescriptions. Functional ability and status Nutritional status Physical activity Advanced directives List of other physicians Hospitalizations, surgeries, and ER visits in previous 12 months Vitals Screenings to include cognitive, depression, and falls Referrals and appointments  In addition, I have reviewed and discussed with patient certain preventive protocols, quality metrics, and best practice recommendations. A written personalized care plan for preventive services as well as general preventive health recommendations were provided to patient.   Verdie CHRISTELLA Saba, CMA   10/16/2023   After Visit Summary: (MyChart) Due to this being a telephonic visit,  the after visit summary with patients personalized plan was offered to patient via MyChart   Notes: Scheduled a [facility] ER f/u appt for the pt for 10/19/2023 due to having a fall in the store on 10/15/23.

## 2023-10-16 NOTE — Patient Instructions (Signed)
 Ms. Folden , Thank you for taking time out of your busy schedule to complete your Annual Wellness Visit with me. I enjoyed our conversation and look forward to speaking with you again next year. I, as well as your care team,  appreciate your ongoing commitment to your health goals. Please review the following plan we discussed and let me know if I can assist you in the future. Your Game plan/ To Do List    Referrals: If you haven't heard from the office you've been referred to, please reach out to them at the phone provided.   Follow up Visits: We will see or speak with you next year for your Next Medicare AWV with our clinical staff Have you seen your provider in the last 6 months (3 months if uncontrolled diabetes)? Yes  Clinician Recommendations:  Aim for 30 minutes of exercise or brisk walking, 6-8 glasses of water, and 5 servings of fruits and vegetables each day. Educated and advised on getting the Tdap and Influenza vaccines in 2025.      This is a list of the screenings recommended for you:  Health Maintenance  Topic Date Due   DTaP/Tdap/Td vaccine (1 - Tdap) Never done   COVID-19 Vaccine (7 - 2024-25 season) 06/09/2023   Flu Shot  09/25/2023   Medicare Annual Wellness Visit  10/15/2024   Cologuard (Stool DNA test)  04/27/2026   Pneumococcal Vaccine for age over 70  Completed   DEXA scan (bone density measurement)  Completed   Hepatitis C Screening  Completed   Zoster (Shingles) Vaccine  Completed   HPV Vaccine  Aged Out   Meningitis B Vaccine  Aged Out    Advanced directives: (Declined) Advance directive discussed with you today. Even though you declined this today, please call our office should you change your mind, and we can give you the proper paperwork for you to fill out. Advance Care Planning is important because it:  [x]  Makes sure you receive the medical care that is consistent with your values, goals, and preferences  [x]  It provides guidance to your family and  loved ones and reduces their decisional burden about whether or not they are making the right decisions based on your wishes.  Follow the link provided in your after visit summary or read over the paperwork we have mailed to you to help you started getting your Advance Directives in place. If you need assistance in completing these, please reach out to us  so that we can help you!

## 2023-10-19 ENCOUNTER — Ambulatory Visit (INDEPENDENT_AMBULATORY_CARE_PROVIDER_SITE_OTHER): Admitting: Family Medicine

## 2023-10-19 ENCOUNTER — Encounter: Payer: Self-pay | Admitting: Family Medicine

## 2023-10-19 VITALS — BP 142/76 | HR 56 | Temp 97.9°F | Resp 18 | Ht 64.0 in | Wt 125.0 lb

## 2023-10-19 DIAGNOSIS — I152 Hypertension secondary to endocrine disorders: Secondary | ICD-10-CM

## 2023-10-19 DIAGNOSIS — R42 Dizziness and giddiness: Secondary | ICD-10-CM | POA: Diagnosis not present

## 2023-10-19 MED ORDER — SPIRONOLACTONE 50 MG PO TABS: 50.0000 mg | ORAL_TABLET | Freq: Every day | ORAL | Status: DC

## 2023-10-19 NOTE — Progress Notes (Signed)
 Assessment & Plan Dizziness Syncope and dizziness likely due to medication changes post-stent placement. Heart rate consistently low. Amlodipine  suspected to cause dizziness. Cardiologist previously recommended neurological evaluation. She does not wish for me to place that referral at this time. She would like to see what changing her medications does and follow-up with cardiology first as recommended by the ER. - Hold amlodipine  2.5 mg daily. - Continue spironolactone  50 mg daily. - Monitor blood pressure at home every other day. - Consider neurology referral if symptoms persist.    Hypertension due to endocrine disorder Hypertension management complicated by syncope and dizziness. Blood pressure high during visit due to missed medication. Endocrinologist advised stopping amlodipine . - Hold amlodipine  2.5 mg daily. - Continue spironolactone  50 mg daily. - Monitor blood pressure at home every other day. - Schedule follow-up with cardiology.       Follow up plan: Return if symptoms worsen or fail to improve.  Niki Rung, MSN, APRN, FNP-C  Subjective:  HPI: Cheryl [name] is a 73 y.o. female presenting on 10/19/2023 for Hospitalization Follow-up (ER follow up Zelda Salmon on 8/21 for syncope/Was at the grocery store, got dizzy, at check out fell out - woke in the floor, they called EMS and went to ER /Has not had this happen before this episode and has not happened since. [place] still c/o dizziness)  Discussed the use of AI scribe software for clinical note transcription with the patient, who gave verbal consent to proceed.  She experienced dizziness and syncope while at the grocery store, resulting in loss of consciousness and waking up on the floor. These dizzy episodes have been ongoing since her coronary artery stent placement in January, after which she was prescribed five new medications.  Amlodipine  was reduced from 5 mg to 2.5 mg previously by cardiology, but she has not  noticed any improvement in her dizziness. Her heart rate has been consistently in the 50s since the surgery, lower than her previous baseline of 60s to 70s. No palpitations or bleeding are reported. Her hemoglobin was low at 9.6 g/dL during her hospital visit, consistent with her usual baseline. She has a history of low normal blood pressures and monitors her blood pressure at home.  She has been taking spironolactone , initially at 100 mg, then reduced to 50 mg, and currently at 25 mg, taking two 25 mg tablets to maintain a 50 mg dose. Her home blood pressure readings are around 100-128/60s, monitored intermittently.  She has increased her fluid intake, primarily with water and Gatorade, and avoids sugary drinks.        ROS: Negative unless specifically indicated above in HPI.   Relevant past medical history reviewed and updated as indicated.   Allergies and medications reviewed and updated.   Current Outpatient Medications:    acetaminophen  (TYLENOL ) 500 MG tablet, Take 500 mg by mouth every 6 (six) hours as needed for mild pain (pain score 1-3)., Disp: , Rfl:    Apoaequorin (PREVAGEN PO), Take 1 tablet by mouth daily., Disp: , Rfl:    aspirin  EC 81 MG tablet, Take 1 tablet (81 mg total) by mouth daily. Swallow whole., Disp: 90 tablet, Rfl: 2   atorvastatin  (LIPITOR) 80 MG tablet, Take 1 tablet (80 mg total) by mouth daily., Disp: 30 tablet, Rfl: 11   bimatoprost (LUMIGAN) 0.01 % SOLN, Place 1 drop into both eyes at bedtime., Disp: , Rfl:    BIOTIN PO, Take 1 tablet by mouth daily., Disp: , Rfl:  budesonide-formoterol (SYMBICORT) 80-4.5 MCG/ACT inhaler, Inhale 2 puffs into the lungs daily as needed (for shortness of breath)., Disp: , Rfl:    clopidogrel  (PLAVIX ) 75 MG tablet, Take 1 tablet (75 mg total) by mouth daily., Disp: 90 tablet, Rfl: 2   Homeopathic Products (THERAWORX MUSCLE CRAMPS EX), Apply 1 Application topically daily., Disp: , Rfl:    nitroGLYCERIN  (NITROSTAT ) 0.4 MG SL  tablet, Place 1 tablet (0.4 mg total) under the tongue every 5 (five) minutes as needed., Disp: 25 tablet, Rfl: 2   Omega-3 Fatty Acids (FISH OIL PO), Take 1 capsule by mouth daily., Disp: , Rfl:    potassium chloride  SA (KLOR-CON  M20) 20 MEQ tablet, Take 1 tablet (20 mEq total) by mouth daily., Disp: 90 tablet, Rfl: 1   SYNTHROID  50 MCG tablet, Take 1 tablet (50 mcg total) by mouth daily before breakfast., Disp: 90 tablet, Rfl: 1   thiamine  (VITAMIN B1) 100 MG tablet, Take 1 tablet (100 mg total) by mouth daily., Disp: 90 tablet, Rfl: 1   spironolactone  (ALDACTONE ) 50 MG tablet, Take 1 tablet (50 mg total) by mouth daily., Disp: , Rfl:   Allergies  Allergen Reactions   Erythromycin Hives   Penicillins Hives    Objective:   BP (!) 142/76   Pulse (!) 56   Temp 97.9 F (36.6 C)   Resp 18   Ht 5' 4 (1.626 m)   Wt 125 lb (56.7 kg)   BMI 21.46 kg/m    Physical Exam Vitals reviewed.  Constitutional:      General: She is not in acute distress.    Appearance: Normal appearance. She is not ill-appearing, toxic-appearing or diaphoretic.  HENT:     Head: Normocephalic and atraumatic.  Eyes:     General: No scleral icterus.       Right eye: No discharge.        Left eye: No discharge.     Conjunctiva/sclera: Conjunctivae normal.  Neck:     Vascular: No carotid bruit.  Cardiovascular:     Rate and Rhythm: Normal rate and regular rhythm.     Heart sounds: Normal heart sounds. No murmur heard.    No friction rub. No gallop.  Pulmonary:     Effort: Pulmonary effort is normal. No respiratory distress.     Breath sounds: Normal breath sounds. No stridor. No wheezing, rhonchi or rales.  Musculoskeletal:        General: Normal range of motion.     Cervical back: Normal range of motion.  Skin:    General: Skin is warm and dry.     Capillary Refill: Capillary refill takes less than 2 seconds.  Neurological:     General: No focal deficit present.     Mental Status: She is alert and  oriented to person, place, and time. Mental status is at baseline.  Psychiatric:        Mood and Affect: Mood normal.        Behavior: Behavior normal.        Thought Content: Thought content normal.        Judgment: Judgment normal.

## 2023-10-19 NOTE — Patient Instructions (Addendum)
 Continue Spironolactone  50 mg daily.  Hold/Stop Amlodipine  2.5 mg daily.  Monitor BP every other day and keep a log.  Schedule appointment with cardiology.

## 2023-10-19 NOTE — Assessment & Plan Note (Signed)
 Hypertension management complicated by syncope and dizziness. Blood pressure high during visit due to missed medication. Endocrinologist advised stopping amlodipine . - Hold amlodipine  2.5 mg daily. - Continue spironolactone  50 mg daily. - Monitor blood pressure at home every other day. - Schedule follow-up with cardiology.

## 2023-10-25 ENCOUNTER — Ambulatory Visit: Admission: EM | Admit: 2023-10-25 | Discharge: 2023-10-25 | Disposition: A | Discharge status: Home / Self Care

## 2023-10-25 DIAGNOSIS — H1131 Conjunctival hemorrhage, right eye: Secondary | ICD-10-CM

## 2023-10-25 NOTE — ED Triage Notes (Signed)
 Pt reports she had a red vein in her right eye and touched it and it bursted open x 1 day.  Pts sclera is dark red

## 2023-10-25 NOTE — ED Provider Notes (Signed)
 [facility]    CSN: [id number] Arrival date & time: 10/25/23  1008      History   Chief Complaint No chief complaint on file.   HPI Cheryl [name] is a 73 y.o. female.   The history is provided by the patient.   Patient presents for complaints of blood in the right eye.  Patient states that she noticed a artery in her right eye yesterday, states that she went to look at it and poked it with her finger and developed blood in the right eye afterwards.  She denies fever, chills, visual changes, headache, dizziness, light sensitivity, swelling, or eye drainage.  Patient states that she has had a cataract removed, and that she also had an aneurysm in one of her eyes, but states that she does not recall which eye.  States that she did use some over-the-counter medication for her symptoms.  Past Medical History:  Diagnosis Date   AAA (abdominal aortic aneurysm) (HCC)    Allergy Years   Arthritis    osteoarthritis   Asthma    Cataract 2023?   Removed in one eye   Glaucoma    Hyperlipidemia    Hypertension    Hypoaldosteronism (HCC)    Renal disorder    renal insufficiency   Sarcoidosis    Thyroid  disease    hypothyroidism    Patient Active Problem List   Diagnosis Date Noted   Constipation, unspecified 08/31/2023   Poisoning by vitamin D  07/01/2023   Chronic kidney disease, stage 4 (severe) (HCC) 04/24/2023   Allergic rhinitis due to pollen 04/22/2023   Elevated sed rate 04/22/2023   Nasal polyp, unspecified 04/22/2023   Other osteoporosis without current pathological fracture 04/22/2023   Primary osteoarthritis involving multiple joints 04/22/2023   Sicca (HCC) 04/22/2023   Tinnitus, bilateral 04/22/2023   Need for prophylactic vaccination with combined diphtheria-tetanus-pertussis (DTP) vaccine 04/22/2023   Screening for colon cancer 04/22/2023   CAD (coronary artery disease) 03/18/2023   Stage 3b chronic kidney disease (HCC) 03/03/2023   Moderate  aortic valve regurgitation 09/20/2022   Bradycardia 08/02/2022   Steroid dependence (HCC) 07/31/2022   Estrogen deficiency 04/03/2022   Anemia due to acquired thiamine  deficiency 03/24/2021   Visit for screening mammogram 03/19/2021   Encounter for general adult medical examination with abnormal findings 03/19/2021   Status post endoscopic repair of thoracic aortic aneurysm (TAA) 03/19/2021   Hyperlipidemia LDL goal <70 03/19/2021   Hypertension    Hypoaldosteronism (HCC)    Sarcoidosis     Past Surgical History:  Procedure Laterality Date   ABDOMINAL AORTIC ANEURYSM REPAIR     BREAST BIOPSY     CARDIAC VALVE REPLACEMENT  Aneurysm repair   CORONARY ANGIOGRAPHY N/A 03/18/2023   Procedure: CORONARY ANGIOGRAPHY;  Surgeon: Verlin Lonni BIRCH, MD;  Location: [facility] INVASIVE CV LAB;  Service: Cardiovascular;  Laterality: N/A;   CORONARY STENT INTERVENTION N/A 03/19/2023   Procedure: CORONARY STENT INTERVENTION;  Surgeon: Swazil[place], Peter M, MD;  Location: [facility] INVASIVE CV LAB;  Service: Cardiovascular;  Laterality: N/A;   EYE SURGERY  Retinal aneurysm   RENAL BIOPSY     renal vein sampling      OB History   No obstetric history on file.      Home Medications    Prior to Admission medications   Medication Sig Start Date End Date Taking? Authorizing Provider  acetaminophen  (TYLENOL ) 500 MG tablet Take 500 mg by mouth every 6 (six) hours as needed for mild pain (  pain score 1-3).    [provider]  Apoaequorin (PREVAGEN PO) Take 1 tablet by mouth daily.    [provider]  aspirin  EC 81 MG tablet Take 1 tablet (81 mg total) by mouth daily. Swallow whole. 03/20/23   Henry Manuelita NOVAK, NP  atorvastatin  (LIPITOR) 80 MG tablet Take 1 tablet (80 mg total) by mouth daily. 03/19/23 03/18/24  Henry Manuelita NOVAK, NP  bimatoprost (LUMIGAN) 0.01 % SOLN Place 1 drop into both eyes at bedtime.    [provider]  BIOTIN PO Take 1 tablet by mouth daily.    [provider]  budesonide-formoterol (SYMBICORT) 80-4.5 MCG/ACT inhaler Inhale 2 puffs into the lungs daily as needed (for shortness of breath).    [provider]  clopidogrel  (PLAVIX ) 75 MG tablet Take 1 tablet (75 mg total) by mouth daily. 03/19/23   Henry Manuelita NOVAK, NP  Homeopathic Products [facility] MUSCLE CRAMPS EX) Apply 1 Application topically daily.    [provider]  nitroGLYCERIN  (NITROSTAT ) 0.4 MG SL tablet Place 1 tablet (0.4 mg total) under the tongue every 5 (five) minutes as needed. 03/19/23   Henry Manuelita NOVAK, NP  Omega-3 Fatty Acids (FISH OIL PO) Take 1 capsule by mouth daily.    [provider]  potassium chloride  SA (KLOR-CON  M20) 20 MEQ tablet Take 1 tablet (20 mEq total) by mouth daily. 08/31/23   Joshua Debby CROME, MD  spironolactone  (ALDACTONE ) 50 MG tablet Take 1 tablet (50 mg total) by mouth daily. 10/19/23   Merlynn Niki FALCON, FNP  SYNTHROID  50 MCG tablet Take 1 tablet (50 mcg total) by mouth daily before breakfast. 08/31/23   Joshua Debby CROME, MD  thiamine  (VITAMIN B1) 100 MG tablet Take 1 tablet (100 mg total) by mouth daily. 03/03/23   Joshua Debby CROME, MD    Family History Family History  Problem Relation Age of Onset   Arthritis Mother    Diabetes Mother    Heart disease Mother    Cancer Father    Obesity Maternal Grandmother    Alcohol abuse Brother     Social History Social History   Tobacco Use   Smoking status: Never    Passive exposure: Never  Substance Use Topics   Alcohol use: Never   Drug use: Never     Allergies   Erythromycin and Penicillins   Review of Systems Review of Systems Per HPI  Physical Exam Triage Vital Signs ED Triage Vitals  Encounter Vitals Group     BP 10/25/23 1021 115/60     Girls Systolic BP Percentile --      Girls Diastolic BP Percentile --      Boys Systolic BP Percentile --      Boys Diastolic BP Percentile --      Pulse Rate 10/25/23 1021 70     Resp 10/25/23 1021 16     Temp  10/25/23 1021 (!) 97.4 F (36.3 C)     Temp Source 10/25/23 1021 Oral     SpO2 10/25/23 1021 96 %     Weight --      Height --      Head Circumference --      Peak Flow --      Pain Score 10/25/23 1020 0     Pain Loc --      Pain Education --      Exclude from Growth Chart --    No data found.  Updated Vital Signs BP 115/60 (  BP Location: Right Arm)   Pulse 70   Temp (!) 97.4 F (36.3 C) (Oral)   Resp 16   SpO2 96%   Visual Acuity Right Eye Distance: 20/30 Left Eye Distance: 20/50 Bilateral Distance: 20/30  Right Eye Near:   Left Eye Near:    Bilateral Near:     Physical Exam Vitals and nursing note reviewed.  Constitutional:      General: She is not in acute distress.    Appearance: Normal appearance.  HENT:     Head: Normocephalic.  Eyes:     General: Lids are normal. Vision grossly intact.        Right eye: No foreign body, discharge or hordeolum.        Left eye: No foreign body, discharge or hordeolum.     Extraocular Movements:     Right eye: Normal extraocular motion and no nystagmus.     Left eye: Normal extraocular motion and no nystagmus.     Conjunctiva/sclera:     Right eye: Right conjunctiva is not injected. Hemorrhage present. No chemosis or exudate.    Left eye: Left conjunctiva is not injected. No chemosis, exudate or hemorrhage.    Pupils: Pupils are equal, round, and reactive to light.  Pulmonary:     Effort: Pulmonary effort is normal.  Skin:    General: Skin is warm and dry.  Neurological:     General: No focal deficit present.     Mental Status: She is alert and oriented to person, place, and time.  Psychiatric:        Mood and Affect: Mood normal.        Behavior: Behavior normal.      UC Treatments / Results  Labs (all labs ordered are listed, but only abnormal results are displayed) Labs Reviewed - No data to display  EKG   Radiology No results found.  Procedures Procedures (including critical care time)  Medications  Ordered in UC Medications - No data to display  Initial Impression / Assessment and Plan / UC Course  I have reviewed the triage vital signs and the nursing notes.  Pertinent labs & imaging results that were available during my care of the patient were reviewed by me and considered in my medical decision making (see chart for details).  Patient with subconjunctival hemorrhage noted to the right eye.  Patient denies visual changes, eye pain, light sensitivity, or drainage from the eye.  Lengthy discussion with patient regarding her symptoms.  Supportive care recommendations were provided and discussed with the patient to include over-the-counter Tylenol  for pain, applying warm compresses to the eye, and to avoid rubbing the eye or sticking anything inside of the eye while symptoms persist.  Given patient's history of aneurysm in one of her eyes, recommended the patient follow-up with her eye doctor, Dr. Octavia, for reevaluation within the next 3 to 5 days.  Patient was also given strict ER follow-up precautions.  Patient was in agreement with this plan of care and verbalizes understanding.  All questions were answered.  Patient stable for discharge.  Final Clinical Impressions(s) / UC Diagnoses   Final diagnoses:  Subconjunctival hemorrhage, right     Discharge Instructions      You may take over-the-counter Tylenol  as needed for pain or discomfort. Apply warm compresses to the eye 3-4 times daily while symptoms persist. Do not rub, manipulate, or stick anything inside of the eye while symptoms persist. You may use over-the-counter Visine eyedrops or clear  eyes to help keep the eye moist and lubricated. Recommend follow-up with your ophthalmologist within the next 3 to 5 days for reevaluation. Follow-up as needed.     ED Prescriptions   None    PDMP not reviewed this encounter.   Gilmer [name] PARAS, NP 10/25/23 1057

## 2023-10-25 NOTE — Discharge Instructions (Signed)
 You may take over-the-counter Tylenol  as needed for pain or discomfort. Apply warm compresses to the eye 3-4 times daily while symptoms persist. Do not rub, manipulate, or stick anything inside of the eye while symptoms persist. You may use over-the-counter Visine eyedrops or clear eyes to help keep the eye moist and lubricated. Recommend follow-up with your ophthalmologist within the next 3 to 5 days for reevaluation. Follow-up as needed.

## 2023-11-10 ENCOUNTER — Other Ambulatory Visit: Payer: Self-pay | Admitting: Internal Medicine

## 2023-11-10 DIAGNOSIS — I10 Essential (primary) hypertension: Secondary | ICD-10-CM

## 2023-11-10 DIAGNOSIS — E2740 Unspecified adrenocortical insufficiency: Secondary | ICD-10-CM

## 2023-11-10 DIAGNOSIS — E876 Hypokalemia: Secondary | ICD-10-CM

## 2023-11-12 ENCOUNTER — Other Ambulatory Visit (HOSPITAL_COMMUNITY): Payer: Self-pay

## 2023-11-13 ENCOUNTER — Other Ambulatory Visit: Payer: Self-pay | Admitting: Internal Medicine

## 2023-11-13 DIAGNOSIS — E2740 Unspecified adrenocortical insufficiency: Secondary | ICD-10-CM

## 2023-11-13 DIAGNOSIS — E876 Hypokalemia: Secondary | ICD-10-CM

## 2023-11-16 ENCOUNTER — Other Ambulatory Visit: Payer: Self-pay | Admitting: Internal Medicine

## 2023-11-16 NOTE — Telephone Encounter (Signed)
 I do not see you have prescribed the 50 mg dosage yet, please advise refill

## 2023-11-16 NOTE — Addendum Note (Signed)
 Addended by: Alanie Syler E on: 11/16/2023 04:31 PM   Modules accepted: Orders

## 2023-11-16 NOTE — Telephone Encounter (Signed)
 Copied from CRM [id number]. Topic: Clinical - Medication Question >> Nov 16, 2023 11:17 AM Rea ORN wrote: Reason for CRM: Pt stated she has been without spironolactone  (ALDACTONE ) 50 MG tablet for one week. She called in a request last week and so did the pharmacy. Please call back to advise status of this rx

## 2023-11-17 ENCOUNTER — Other Ambulatory Visit: Payer: Self-pay | Admitting: Internal Medicine

## 2023-11-17 DIAGNOSIS — E2740 Unspecified adrenocortical insufficiency: Secondary | ICD-10-CM

## 2023-11-17 DIAGNOSIS — I152 Hypertension secondary to endocrine disorders: Secondary | ICD-10-CM

## 2023-11-17 MED ORDER — SPIRONOLACTONE 50 MG PO TABS: 50.0000 mg | ORAL_TABLET | Freq: Every day | ORAL | 0 refills | Status: DC

## 2023-12-11 ENCOUNTER — Other Ambulatory Visit (HOSPITAL_COMMUNITY): Payer: Self-pay

## 2023-12-11 ENCOUNTER — Other Ambulatory Visit: Payer: Self-pay | Admitting: Cardiology

## 2023-12-11 MED ORDER — CLOPIDOGREL BISULFATE 75 MG PO TABS
75.0000 mg | ORAL_TABLET | Freq: Every day | ORAL | 2 refills | Status: AC
  Filled 2023-12-11: qty 90, 90d supply, fill #0
  Filled 2024-03-11: qty 90, 90d supply, fill #1

## 2023-12-28 NOTE — Progress Notes (Signed)
 CARDIOLOGY CONSULT NOTE       Patient ID: Cheryl [name] MRN: [id number] DOB/AGE: 08/02/1950 73 y.o.  Referring Physician: Joshua Primary Physician: Joshua Debby CROME, MD Primary Cardiologist: Delford Reason for Consultation: AV dx   HPI:  73 y.o. referred by Dr Joshua for aortic valve dx. First seen by me 12/16/22 History of AAA, HLD, HTN Sarcoid and hypothyroidism. She has had chronic LE edema with issues low K She does have some exertional dyspnea No chest pain Systolic BP has been up diastolic not low. Aldactone  added back by primary 11/24/22 Renal function is abnormal with CR 1.57 to 1.25 BNP minimally elevated at 272 She has not had any imaging of her LE venous system   Review of her TTE done 09/19/22 showed unusual apical aneurysm as well has moderate AR in setting of tri leaflet AV and ascending aorta of 3.8 cm   She has had a repair of descending thoracic aneurysm by Dr Lucas in 2012 Last US  2017 no residual aneurysm  LE edema mild and improved on aldactone  She is married with one older son in [place] with 3 grand kids. 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 Apical wall thickness 10 mm Consistent with diagnosis of more burned out apical hypertrophic DCM>  AR was moderate with RF 19%  PET/CT: EF 49% apical aneurysm ? LAD infarct with peri infarct ischemia severe coronary calcium  involving all 3 arteries   Subsequently had cath and stenting of the mid LAD Dr Jordan on  03/19/23.    Seen by PA May 2025 and norvasc  dose decreased to 2.5 mg daily for dizziness and postural symptoms Seen in ED 10/16/23 with negative w/u. CT head negative no arrhythmias. ? Vasovagal.  ECG NSR chronic biphasic lateral T waves   Pretty much goes shopping during week and church on Sunday She enjoyed cardiac rehab  Encouraged her to go to [facility]. Only has fatigue   ROS All other systems reviewed and negative except as noted above  Past Medical History:  Diagnosis Date   AAA (abdominal aortic aneurysm)    Allergy Years   Arthritis    osteoarthritis   Asthma    Cataract 2023?   Removed in one eye   Glaucoma    Hyperlipidemia    Hypertension    Hypoaldosteronism    Renal disorder    renal insufficiency   Sarcoidosis    Thyroid  disease    hypothyroidism    Family History  Problem Relation Age of Onset   Arthritis Mother    Diabetes Mother    Heart disease Mother    Cancer Father    Obesity Maternal Grandmother    Alcohol abuse Brother     Social History   Socioeconomic History   Marital status: Married    Spouse name: Not on file   Number of children: Not on file   Years of education: Not on file   Highest education level: Master's degree (e.g., MA, MS, MEng, MEd, MSW, MBA)  Occupational History   Not on file  Tobacco Use   Smoking status: Never    Passive exposure: Never   Smokeless tobacco: Not on file  Substance and Sexual Activity   Alcohol use: Never   Drug use: Never   Sexual activity: Not Currently    Birth control/protection: None  Other Topics  Concern   Not on file  Social History Narrative   Married   Social Drivers of Health   Financial Resource Strain: Low Risk  (10/16/2023)   Overall Financial Resource Strain (CARDIA)    Difficulty of Paying Living Expenses: Not hard at all  Food Insecurity: No Food Insecurity (10/16/2023)   Hunger Vital Sign    Worried About Running Out of Food in the Last Year: Never true    Ran Out of Food in the Last Year: Never true  Transportation Needs: No Transportation Needs (10/16/2023)   PRAPARE - Administrator, Civil Service (Medical): No    Lack of Transportation (Non-Medical): No  Physical Activity: Inactive (10/16/2023)   Exercise Vital Sign    Days of Exercise per Week: 0 days    Minutes of Exercise per  Session: 0 min  Stress: No Stress Concern Present (10/16/2023)   [employer] of Occupational Health - Occupational Stress Questionnaire    Feeling of Stress: Only a little  Social Connections: Socially Integrated (10/16/2023)   Social Connection and Isolation Panel    Frequency of Communication with Friends and Family: Twice a week    Frequency of Social Gatherings with Friends and Family: Twice a week    Attends Religious Services: More than 4 times per year    Active Member of [employer] or Organizations: Yes    Attends Banker Meetings: 1 to 4 times per year    Marital Status: Married  Catering Manager Violence: Not At Risk (10/16/2023)   Humiliation, Afraid, Rape, and Kick questionnaire    Fear of Current or Ex-Partner: No    Emotionally Abused: No    Physically Abused: No    Sexually Abused: No    Past Surgical History:  Procedure Laterality Date   ABDOMINAL AORTIC ANEURYSM REPAIR     BREAST BIOPSY     CARDIAC VALVE REPLACEMENT  Aneurysm repair   CORONARY ANGIOGRAPHY N/A 03/18/2023   Procedure: CORONARY ANGIOGRAPHY;  Surgeon: Verlin Lonni BIRCH, MD;  Location: [facility] INVASIVE CV LAB;  Service: Cardiovascular;  Laterality: N/A;   CORONARY STENT INTERVENTION N/A 03/19/2023   Procedure: CORONARY STENT INTERVENTION;  Surgeon: Jordan, Charde Macfarlane M, MD;  Location: [facility] INVASIVE CV LAB;  Service: Cardiovascular;  Laterality: N/A;   EYE SURGERY  Retinal aneurysm   RENAL BIOPSY     renal vein sampling        Current Outpatient Medications:    acetaminophen  (TYLENOL ) 500 MG tablet, Take 500 mg by mouth every 6 (six) hours as needed for mild pain (pain score 1-3)., Disp: , Rfl:    amLODipine  (NORVASC ) 2.5 MG tablet, Take 2.5 mg by mouth daily., Disp: , Rfl:    Apoaequorin (PREVAGEN PO), Take 1 tablet by mouth daily., Disp: , Rfl:    aspirin  EC 81 MG tablet, Take 1 tablet (81 mg total) by mouth daily. Swallow whole., Disp: 90 tablet, Rfl: 2   atorvastatin  (LIPITOR) 80 MG tablet,  Take 1 tablet (80 mg total) by mouth daily., Disp: 30 tablet, Rfl: 11   bimatoprost (LUMIGAN) 0.01 % SOLN, Place 1 drop into both eyes at bedtime., Disp: , Rfl:    BIOTIN PO, Take 1 tablet by mouth daily., Disp: , Rfl:    clopidogrel  (PLAVIX ) 75 MG tablet, Take 1 tablet (75 mg total) by mouth daily., Disp: 90 tablet, Rfl: 2   Homeopathic Products (THERAWORX MUSCLE CRAMPS EX), Apply 1 Application topically daily., Disp: , Rfl:    Omega-3 Fatty Acids (  FISH OIL PO), Take 1 capsule by mouth daily., Disp: , Rfl:    potassium chloride  SA (KLOR-CON  M) 20 MEQ tablet, Take 20 mEq by mouth daily., Disp: , Rfl:    spironolactone  (ALDACTONE ) 50 MG tablet, Take 1 tablet (50 mg total) by mouth daily., Disp: 90 tablet, Rfl: 0   SYNTHROID  50 MCG tablet, Take 1 tablet (50 mcg total) by mouth daily before breakfast., Disp: 90 tablet, Rfl: 1   thiamine  (VITAMIN B1) 100 MG tablet, Take 1 tablet (100 mg total) by mouth daily., Disp: 90 tablet, Rfl: 1   budesonide-formoterol (SYMBICORT) 80-4.5 MCG/ACT inhaler, Inhale 2 puffs into the lungs daily as needed (for shortness of breath). (Patient not taking: Reported on 01/05/2024), Disp: , Rfl:    nitroGLYCERIN  (NITROSTAT ) 0.4 MG SL tablet, Place 1 tablet (0.4 mg total) under the tongue every 5 (five) minutes as needed. (Patient not taking: Reported on 01/05/2024), Disp: 25 tablet, Rfl: 2    Physical Exam: There were no vitals taken for this visit.    Affect appropriate Healthy:  appears stated age HEENT: normal Neck supple with no adenopathy JVP normal no bruits no thyromegaly Lungs clear with no wheezing and good diaphragmatic motion Heart:  S1/S2 SEM and AR  murmur, no rub, gallop or click PMI normal left lateral thoracotomy scar  Abdomen: benighn, BS positve, no tenderness, no AAA no bruit.  No HSM or HJR Distal pulses intact with no bruits No edema Neuro non-focal Skin warm and dry No muscular weakness   Labs:   Lab Results  Component Value Date    WBC 6.5 10/15/2023   HGB 9.6 (L) 10/15/2023   HCT 29.5 (L) 10/15/2023   MCV 97.0 10/15/2023   PLT 207 10/15/2023   No results for input(s): NA, K, CL, CO2, BUN, CREATININE, CALCIUM , PROT, BILITOT, ALKPHOS, ALT, AST, GLUCOSE in the last 168 hours.  Invalid input(s): LABALBU Lab Results  Component Value Date   CKTOTAL 106 08/31/2023    Lab Results  Component Value Date   CHOL 128 08/31/2023   CHOL 160 10/03/2022   CHOL 125 01/02/2022   Lab Results  Component Value Date   HDL 60.30 08/31/2023   HDL 56.70 10/03/2022   HDL 57.60 01/02/2022   Lab Results  Component Value Date   LDLCALC 58 08/31/2023   LDLCALC 84 10/03/2022   LDLCALC 54 01/02/2022   Lab Results  Component Value Date   TRIG 46.0 08/31/2023   TRIG 95.0 10/03/2022   TRIG 65.0 01/02/2022   Lab Results  Component Value Date   CHOLHDL 2 08/31/2023   CHOLHDL 3 10/03/2022   CHOLHDL 2 01/02/2022   No results found for: LDLDIRECT    Radiology: No results found.   EKG: SR inferior lateral T wave inversions    ASSESSMENT AND PLAN:   AV dx:  moderate AR should not be causing symptoms  Diastolic pressures not low BP control with aldactone  and hydralazine  f/u echo in a year LV compensated and not enlarged RF by MRI 03/04/23 was only 19%  Apical Anueurysm with abnormal ECG suggested possible burnt out apical hypertorphic DCM.  Clinically this is less likely prior infarct However her PET CT shows abnormal LAD area MBFR ? Peri infarct ischemia and severe 3 vessel coronary calcium   Cath done 1/22 with 90% mid LAD stenosis with subsequent stent  CRF:  Cr 1.69 stable Aneurysm:  descending thoracic aorta repair Dr Lucas 2012 F/U with CVTS  CTA done 10/15/23 with no residual aneurysm  Dizziness: with balance issues. Chronic BP meds reduced  CT head normal 10/15/23 Will order carotid duplex and check vertebral/carotids given above vascular dx. HTN:  see above  HLD continue statin LDL 58 now on  lipitor 80 mg  Thyroid :  continue synthroid  replacement TSH normal 10/03/22    Carotid duplex Echo for AR 02/2024   F/U in a year   Signed: Maude Emmer 01/05/2024, 8:52 AM

## 2024-01-05 ENCOUNTER — Ambulatory Visit: Attending: Cardiovascular Disease | Admitting: Cardiovascular Disease

## 2024-01-05 DIAGNOSIS — Z955 Presence of coronary angioplasty implant and graft: Secondary | ICD-10-CM | POA: Diagnosis not present

## 2024-01-05 DIAGNOSIS — I351 Nonrheumatic aortic (valve) insufficiency: Secondary | ICD-10-CM

## 2024-01-05 DIAGNOSIS — I7123 Aneurysm of the descending thoracic aorta, without rupture: Secondary | ICD-10-CM

## 2024-01-05 DIAGNOSIS — R2689 Other abnormalities of gait and mobility: Secondary | ICD-10-CM

## 2024-01-05 DIAGNOSIS — I10 Essential (primary) hypertension: Secondary | ICD-10-CM | POA: Diagnosis not present

## 2024-01-05 NOTE — Patient Instructions (Addendum)
 Medication Instructions:  Your physician recommends that you continue on your current medications as directed. Please refer to the Current Medication list given to you today.  *If you need a refill on your cardiac medications before your next appointment, please call your pharmacy*  Lab Work: None ordered.  You may go to any [facility] Location for your lab work:  [facility] - [address] ([facility]) - [address] [phone] [address]  [place] - [address]   [place]  - [address]   [place] - [address] - [address] [place]  - [address] ([facility]   If you have labs (blood work) drawn today and your tests are completely normal, you will receive your results only by: [employer] (if you have MyChart)  If you have any lab test that is abnormal or we need to change your treatment, we will call you or send a MyChart message to review the results.  Testing/Procedures: In January  Your physician has requested that you have an echocardiogram. Echocardiography is a painless test that uses sound waves to create images of your heart. It provides your doctor with information about the size and shape of your heart and how well your heart's chambers and valves are working. This procedure takes approximately one hour. There are no restrictions for this procedure. Please do NOT wear cologne, perfume, aftershave, or lotions (deodorant is allowed). Please arrive 15 minutes prior to your appointment time.  Please note: We ask at that you not bring children with you during ultrasound (echo/ vascular) testing. Due to room size and safety concerns, children are not allowed in the ultrasound rooms during exams. Our front office staff cannot provide observation of children in our lobby area while testing is being conducted. An adult  accompanying a patient to their appointment will only be allowed in the ultrasound room at the discretion of the ultrasound technician under special circumstances. We apologize for any inconvenience.     Follow-Up: At [facility], you and your health needs are our priority.  As part of our continuing mission to provide you with exceptional heart care, we have created designated Provider Care Teams.  These Care Teams include your primary Cardiologist (physician) and Advanced Practice Providers (APPs -  Physician Assistants and Nurse Practitioners) who all work together to provide you with the care you need, when you need it.  We recommend signing up for the patient portal called MyChart.  Sign up information is provided on this After Visit Summary.  MyChart is used to connect with patients for Virtual Visits (Telemedicine).  Patients are able to view lab/test results, encounter notes, upcoming appointments, etc.  Non-urgent messages can be sent to your provider as well.   To learn more about what you can do with MyChart, go to [email or web address].    Your next appointment:   1 year(s)  The format for your next appointment:   In Person  Provider:   Maude Emmer, MD

## 2024-02-01 ENCOUNTER — Other Ambulatory Visit (HOSPITAL_COMMUNITY): Payer: Self-pay | Admitting: Endocrinology

## 2024-02-01 ENCOUNTER — Ambulatory Visit (HOSPITAL_COMMUNITY)
Admission: RE | Admit: 2024-02-01 | Discharge: 2024-02-01 | Disposition: A | Source: Ambulatory Visit | Attending: Endocrinology | Admitting: Endocrinology

## 2024-02-01 DIAGNOSIS — E279 Disorder of adrenal gland, unspecified: Secondary | ICD-10-CM

## 2024-02-05 ENCOUNTER — Other Ambulatory Visit (HOSPITAL_COMMUNITY): Payer: Self-pay

## 2024-02-16 ENCOUNTER — Other Ambulatory Visit: Payer: Self-pay | Admitting: Internal Medicine

## 2024-02-16 DIAGNOSIS — I152 Hypertension secondary to endocrine disorders: Secondary | ICD-10-CM

## 2024-02-16 DIAGNOSIS — E2740 Unspecified adrenocortical insufficiency: Secondary | ICD-10-CM

## 2024-02-29 ENCOUNTER — Ambulatory Visit (HOSPITAL_BASED_OUTPATIENT_CLINIC_OR_DEPARTMENT_OTHER)
Admission: RE | Admit: 2024-02-29 | Discharge: 2024-02-29 | Disposition: A | Discharge status: Home / Self Care | Source: Ambulatory Visit | Attending: Cardiovascular Disease | Admitting: Cardiovascular Disease

## 2024-02-29 ENCOUNTER — Ambulatory Visit: Payer: Self-pay | Admitting: Cardiovascular Disease

## 2024-02-29 ENCOUNTER — Ambulatory Visit (HOSPITAL_COMMUNITY)
Admission: RE | Admit: 2024-02-29 | Discharge: 2024-02-29 | Disposition: A | Discharge status: Home / Self Care | Source: Ambulatory Visit | Attending: Cardiovascular Disease | Admitting: Cardiovascular Disease

## 2024-02-29 DIAGNOSIS — I7123 Aneurysm of the descending thoracic aorta, without rupture: Secondary | ICD-10-CM | POA: Diagnosis not present

## 2024-02-29 DIAGNOSIS — R42 Dizziness and giddiness: Secondary | ICD-10-CM | POA: Diagnosis present

## 2024-02-29 LAB — ECHOCARDIOGRAM COMPLETE
AR max vel: 2.52 cm2
AV Area VTI: 2.49 cm2
AV Area mean vel: 2.41 cm2
AV Mean grad: 3 mmHg
AV Peak grad: 6.5 mmHg
Ao pk vel: 1.27 m/s
Area-P 1/2: 2.22 cm2
MV M vel: 4.64 m/s
MV Peak grad: 86.1 mmHg
P 1/2 time: 630 ms
Radius: 0.78 cm
S' Lateral: 3.2 cm

## 2024-03-01 ENCOUNTER — Other Ambulatory Visit: Payer: Self-pay | Admitting: Internal Medicine

## 2024-03-01 DIAGNOSIS — E039 Hypothyroidism, unspecified: Secondary | ICD-10-CM

## 2024-03-07 ENCOUNTER — Encounter: Payer: Self-pay | Admitting: Internal Medicine

## 2024-03-07 ENCOUNTER — Ambulatory Visit: Payer: Self-pay | Admitting: Internal Medicine

## 2024-03-07 ENCOUNTER — Ambulatory Visit: Admitting: Internal Medicine

## 2024-03-07 VITALS — BP 144/66 | HR 72 | Temp 98.1°F | Resp 16 | Ht 64.0 in | Wt 124.8 lb

## 2024-03-07 DIAGNOSIS — N184 Chronic kidney disease, stage 4 (severe): Secondary | ICD-10-CM | POA: Insufficient documentation

## 2024-03-07 DIAGNOSIS — I10 Essential (primary) hypertension: Secondary | ICD-10-CM

## 2024-03-07 DIAGNOSIS — R001 Bradycardia, unspecified: Secondary | ICD-10-CM | POA: Diagnosis not present

## 2024-03-07 DIAGNOSIS — T452X1S Poisoning by vitamins, accidental (unintentional), sequela: Secondary | ICD-10-CM

## 2024-03-07 LAB — BASIC METABOLIC PANEL WITH GFR
BUN: 26 mg/dL — ABNORMAL HIGH (ref 6–23)
CO2: 23 meq/L (ref 19–32)
Calcium: 9.9 mg/dL (ref 8.4–10.5)
Chloride: 108 meq/L (ref 96–112)
Creatinine, Ser: 1.57 mg/dL — ABNORMAL HIGH (ref 0.40–1.20)
GFR: 32.01 mL/min — ABNORMAL LOW
Glucose, Bld: 100 mg/dL — ABNORMAL HIGH (ref 70–99)
Potassium: 4 meq/L (ref 3.5–5.1)
Sodium: 140 meq/L (ref 135–145)

## 2024-03-07 LAB — CBC WITH DIFFERENTIAL/PLATELET
Basophils Absolute: 0 K/uL (ref 0.0–0.1)
Basophils Relative: 0.6 % (ref 0.0–3.0)
Eosinophils Absolute: 0.2 K/uL (ref 0.0–0.7)
Eosinophils Relative: 3.4 % (ref 0.0–5.0)
HCT: 29.2 % — ABNORMAL LOW (ref 36.0–46.0)
Hemoglobin: 9.9 g/dL — ABNORMAL LOW (ref 12.0–15.0)
Lymphocytes Relative: 32 % (ref 12.0–46.0)
Lymphs Abs: 1.6 K/uL (ref 0.7–4.0)
MCHC: 33.9 g/dL (ref 30.0–36.0)
MCV: 93.1 fl (ref 78.0–100.0)
Monocytes Absolute: 0.3 K/uL (ref 0.1–1.0)
Monocytes Relative: 6.9 % (ref 3.0–12.0)
Neutro Abs: 2.9 K/uL (ref 1.4–7.7)
Neutrophils Relative %: 57.1 % (ref 43.0–77.0)
Platelets: 218 K/uL (ref 150.0–400.0)
RBC: 3.13 Mil/uL — ABNORMAL LOW (ref 3.87–5.11)
RDW: 15.4 % (ref 11.5–15.5)
WBC: 5 K/uL (ref 4.0–10.5)

## 2024-03-07 LAB — VITAMIN D 25 HYDROXY (VIT D DEFICIENCY, FRACTURES): VITD: 85.37 ng/mL (ref 30.00–100.00)

## 2024-03-07 NOTE — Patient Instructions (Signed)
 Bradycardia, Adult Bradycardia is a slower-than-normal heartbeat. A normal resting heart rate for an adult ranges from 60 to 100 beats per minute. With bradycardia, the resting heart rate is less than 60 beats per minute. Bradycardia can prevent enough oxygen  from reaching certain areas of your body when you are active. It can be serious if it keeps enough oxygen  from reaching your brain and other parts of your body. Bradycardia is not a problem for everyone. For some healthy adults, a slow resting heart rate is normal. What are the causes? This condition may be caused by: A problem with the heart, including: A problem with the heart's electrical system, such as a heart block. With a heart block, electrical signals between the chambers of the heart are partially or completely blocked, so they are not able to work as they should. A problem with the heart's natural pacemaker (sinus node). Heart disease. A heart attack. Heart damage. Lyme disease. A heart infection. A heart condition that is present at birth (congenital heart defect). Certain medicines that treat heart conditions. Certain conditions, such as hypothyroidism and obstructive sleep apnea. Problems with the balance of chemicals and other substances, like potassium, in the blood. Trauma. Radiation therapy. What increases the risk? You are more likely to develop this condition if you: Are age 30 or older. Have high blood pressure (hypertension), high cholesterol (hyperlipidemia), or diabetes. Drink heavily, use tobacco or nicotine products, or use drugs. What are the signs or symptoms? Symptoms of this condition include: Light-headedness. Feeling faint or fainting. Fatigue and weakness. Trouble with activity or exercise. Shortness of breath. Chest pain (angina). Drowsiness. Confusion. Dizziness. How is this diagnosed? This condition may be diagnosed based on: Your symptoms. Your medical history. A physical exam. During  the exam, your health care provider will listen to your heartbeat and check your pulse. To confirm the diagnosis, your health care provider may order tests, such as: Blood tests. An electrocardiogram (ECG). This test records the heart's electrical activity. The test can show how fast your heart is beating and whether the heartbeat is steady. A test in which you wear a portable device (event recorder or Holter monitor) to record your heart's electrical activity while you go about your day. An exercise test. How is this treated? Treatment for this condition depends on the cause of the condition and how severe your symptoms are. Treatment may involve: Treatment of the underlying condition. Changing your medicines or how much medicine you take. Having a small, battery-operated device called a pacemaker implanted under the skin. When bradycardia occurs, this device can be used to increase your heart rate and help your heart beat in a regular rhythm. Follow these instructions at home: Lifestyle Manage any health conditions that contribute to bradycardia as told by your health care provider. Follow a heart-healthy diet. A nutrition specialist (dietitian) can help educate you about healthy food options and changes. Follow an exercise program that is approved by your health care provider. Maintain a healthy weight. Try to reduce or manage your stress, such as with yoga or meditation. If you need help reducing stress, ask your health care provider. Do not use any products that contain nicotine or tobacco. These products include cigarettes, chewing tobacco, and vaping devices, such as e-cigarettes. If you need help quitting, ask your health care provider. Do not use illegal drugs. Alcohol  use If you drink alcohol : Limit how much you have to: 0-1 drink a day for women who are not pregnant. 0-2 drinks a day  for men. Know how much alcohol  is in a drink. In the [place] one drink equals one 12 oz bottle of  beer (355 mL), one 5 oz glass of wine (148 mL), or one 1 oz glass of hard liquor (44 mL). General instructions Take over-the-counter and prescription medicines only as told by your health care provider. Keep all follow-up visits. This is important. How is this prevented? In some cases, bradycardia may be prevented by: Treating underlying medical problems. Stopping behaviors or medicines that can trigger the condition. Contact a health care provider if: You feel light-headed or dizzy. You almost faint. You feel weak or are easily fatigued during physical activity. You experience confusion or have memory problems. Get help right away if: You faint. You have chest pains or an irregular heartbeat (palpitations). You have trouble breathing. These symptoms may represent a serious problem that is an emergency. Do not wait to see if the symptoms will go away. Get medical help right away. Call your local emergency services (911 in the [place] Do not drive yourself to the hospital. Summary Bradycardia is a slower-than-normal heartbeat. With bradycardia, the resting heart rate is less than 60 beats per minute. Treatment for this condition depends on the cause. Manage any health conditions that contribute to bradycardia as told by your health care provider. Do not use any products that contain nicotine or tobacco. These products include cigarettes, chewing tobacco, and vaping devices, such as e-cigarettes. Keep all follow-up visits. This is important. This information is not intended to replace advice given to you by your health care provider. Make sure you discuss any questions you have with your health care provider. Document Revised: 06/03/2020 Document Reviewed: 06/03/2020 Elsevier Patient Education  2024 [employer].

## 2024-03-07 NOTE — Progress Notes (Signed)
 "  Subjective:  Patient ID: Cheryl [name], female    DOB: 09/12/1950  Age: 74 y.o. MRN: [id number]  CC: Hypertension   HPI Cheryl [name] presents for f/up ---  Discussed the use of AI scribe software for clinical note transcription with the patient, who gave verbal consent to proceed.  History of Present Illness Cheryl [name] is a 74 year old female who presents with fatigue and lack of energy.  She has been experiencing low blood pressure readings, with the most recent being 104/58 mmHg. Her blood pressure has been fluctuating and has been low intermittently for some time, first noticed during her cardiac rehab sessions where readings were often in the fifties.  She describes feeling fatigued and having no energy. No weakness, dizziness, or lightheadedness, except for occasional lightheadedness. No chest pain or shortness of breath during cardiac rehab. She sometimes feels like her legs might be a little swollen.  She is not currently taking amlodipine , despite a notice in her chart suggesting she should. She stopped taking it in October. She still has a 90-day supply of the medication.  Her weight is stable, ranging from 123 to 125 pounds, and her appetite is steady. No changes in breathing, coughing, wheezing, or irregular heartbeats. An echocardiogram performed last week showed a 'low normal' heartbeat, but she does not feel her heart beating irregularly.  She has received both her flu and COVID vaccines.     Outpatient Medications Prior to Visit  Medication Sig Dispense Refill   acetaminophen  (TYLENOL ) 500 MG tablet Take 500 mg by mouth every 6 (six) hours as needed for mild pain (pain score 1-3).     Apoaequorin (PREVAGEN PO) Take 1 tablet by mouth daily.     aspirin  EC 81 MG tablet Take 1 tablet (81 mg total) by mouth daily. Swallow whole. 90 tablet 2   atorvastatin  (LIPITOR) 80 MG tablet Take 1 tablet (80 mg total) by mouth daily. 30 tablet 11   bimatoprost (LUMIGAN)  0.01 % SOLN Place 1 drop into both eyes at bedtime.     BIOTIN PO Take 1 tablet by mouth daily.     budesonide-formoterol (SYMBICORT) 80-4.5 MCG/ACT inhaler Inhale 2 puffs into the lungs daily as needed (for shortness of breath).     clopidogrel  (PLAVIX ) 75 MG tablet Take 1 tablet (75 mg total) by mouth daily. 90 tablet 2   Homeopathic Products (THERAWORX MUSCLE CRAMPS EX) Apply 1 Application topically daily.     Omega-3 Fatty Acids (FISH OIL PO) Take 1 capsule by mouth daily.     potassium chloride  SA (KLOR-CON  M) 20 MEQ tablet Take 20 mEq by mouth daily.     spironolactone  (ALDACTONE ) 50 MG tablet TAKE 1 TABLET BY MOUTH EVERY DAY 90 tablet 0   SYNTHROID  50 MCG tablet TAKE 1 TABLET BY MOUTH DAILY BEFORE BREAKFAST 90 tablet 1   thiamine  (VITAMIN B1) 100 MG tablet Take 1 tablet (100 mg total) by mouth daily. 90 tablet 1   amLODipine  (NORVASC ) 2.5 MG tablet Take 2.5 mg by mouth daily.     nitroGLYCERIN  (NITROSTAT ) 0.4 MG SL tablet Place 1 tablet (0.4 mg total) under the tongue every 5 (five) minutes as needed. (Patient not taking: Reported on 03/07/2024) 25 tablet 2   No facility-administered medications prior to visit.    ROS Review of Systems  Constitutional:  Positive for fatigue. Negative for appetite change, diaphoresis, fever and unexpected weight change.  HENT: Negative.  Negative for sore throat and trouble swallowing.  Eyes: Negative.   Respiratory: Negative.  Negative for cough, chest tightness, shortness of breath and wheezing.   Cardiovascular:  Negative for chest pain, palpitations and leg swelling.  Gastrointestinal:  Negative for abdominal pain, constipation, diarrhea and nausea.  Endocrine: Negative.   Genitourinary: Negative.  Negative for difficulty urinating and dysuria.  Musculoskeletal: Negative.  Negative for arthralgias and myalgias.  Skin: Negative.   Neurological:  Positive for light-headedness. Negative for dizziness, syncope and weakness.  Hematological:   Negative for adenopathy. Does not bruise/bleed easily.  Psychiatric/Behavioral:  Positive for confusion and decreased concentration.     Objective:  BP (!) 144/66 (BP Location: Left Arm, Patient Position: Sitting, Cuff Size: Normal)   Pulse 72   Temp 98.1 F (36.7 C) (Oral)   Resp 16   Ht 5' 4 (1.626 m)   Wt 124 lb 12.8 oz (56.6 kg)   SpO2 99%   BMI 21.42 kg/m   BP Readings from Last 3 Encounters:  03/07/24 (!) 144/66  10/25/23 115/60  10/19/23 (!) 142/76    Wt Readings from Last 3 Encounters:  03/07/24 124 lb 12.8 oz (56.6 kg)  10/19/23 125 lb (56.7 kg)  10/16/23 122 lb (55.3 kg)    Physical Exam Vitals reviewed.  Constitutional:      General: She is not in acute distress.    Appearance: Normal appearance. She is not ill-appearing, toxic-appearing or diaphoretic.  HENT:     Nose: Nose normal.     Mouth/Throat:     Mouth: Mucous membranes are moist.  Eyes:     General: No scleral icterus.    Conjunctiva/sclera: Conjunctivae normal.  Cardiovascular:     Rate and Rhythm: Regular rhythm. Bradycardia present.     Heart sounds: Normal heart sounds, S1 normal and S2 normal. No murmur heard.    No gallop.     Comments: EKG--- SB (new), 59 bpm T wave changes in inferior/anterior/lateral leads is not new No Q waves or LVH  Pulmonary:     Effort: Pulmonary effort is normal.     Breath sounds: No stridor. No wheezing, rhonchi or rales.  Abdominal:     General: Abdomen is flat.     Palpations: There is no mass.     Tenderness: There is no abdominal tenderness. There is no guarding.     Hernia: No hernia is present.  Musculoskeletal:     Cervical back: Neck supple.     Right lower leg: No edema.     Left lower leg: No edema.  Skin:    General: Skin is warm.     Coloration: Skin is not jaundiced.     Findings: No lesion.  Neurological:     General: No focal deficit present.     Mental Status: She is alert.  Psychiatric:        Mood and Affect: Mood normal.         Behavior: Behavior normal.     Lab Results  Component Value Date   WBC 5.0 03/07/2024   HGB 9.9 (L) 03/07/2024   HCT 29.2 (L) 03/07/2024   PLT 218.0 03/07/2024   GLUCOSE 100 (H) 03/07/2024   CHOL 128 08/31/2023   TRIG 46.0 08/31/2023   HDL 60.30 08/31/2023   LDLCALC 58 08/31/2023   ALT 26 10/15/2023   AST 38 10/15/2023   NA 140 03/07/2024   K 4.0 03/07/2024   CL 108 03/07/2024   CREATININE 1.57 (H) 03/07/2024   BUN 26 (H) 03/07/2024  CO2 23 03/07/2024   TSH 2.50 07/01/2023    VAS US  CAROTID Result Date: 03/02/2024 Carotid Arterial Duplex Study Patient Name:  Antanisha Mohs  Date of Exam:   02/29/2024 Medical Rec #: [id number]         Accession #:    [id number] Date of Birth: 1950-03-27         Patient Gender: F Patient Age:   69 years Exam Location:  [address] Procedure:      VAS US  CAROTID Referring Phys: MAUDE EMMER --------------------------------------------------------------------------------  Indications:       Patient indicates she has felt dizziness and overall fatigue                    since coronary stent was placed in 02/2023. She also had one                    syncopal episode in 09/2023. Otherwise, she denies any other                    cerebrovascular symptoms. Risk Factors:      Hypertension, hyperlipidemia, no history of smoking, coronary                    artery disease. Other Factors:     History of CKD stage 4                     History of sarcoid. Comparison Study:  None Performing Technologist: Alecia Mackin RVT, RDCS (AE), RDMS  Examination Guidelines: A complete evaluation includes B-mode imaging, spectral Doppler, color Doppler, and power Doppler as needed of all accessible portions of each vessel. Bilateral testing is considered an integral part of a complete examination. Limited examinations for reoccurring indications may be performed as noted.  Right Carotid Findings: +----------+--------+--------+--------+------------------+--------+            PSV cm/sEDV cm/sStenosisPlaque DescriptionComments +----------+--------+--------+--------+------------------+--------+ CCA Prox  91      9                                          +----------+--------+--------+--------+------------------+--------+ CCA Distal44      13              focal and calcific         +----------+--------+--------+--------+------------------+--------+ ICA Prox  129     32      1-39%   focal and calcific         +----------+--------+--------+--------+------------------+--------+ ICA Mid   107     23                                         +----------+--------+--------+--------+------------------+--------+ ICA Distal111     31                                         +----------+--------+--------+--------+------------------+--------+ ECA       40      0                                          +----------+--------+--------+--------+------------------+--------+ +----------+--------+-------+----------------+-------------------+  PSV cm/sEDV cmsDescribe        Arm Pressure (mmHG) +----------+--------+-------+----------------+-------------------+ [id number]             Multiphasic, [id number]                 +----------+--------+-------+----------------+-------------------+ +---------+--------+--+--------+-+---------+ VertebralPSV cm/s50EDV cm/s6Antegrade +---------+--------+--+--------+-+---------+  Left Carotid Findings: +----------+--------+--------+--------+------------------+------------------+           PSV cm/sEDV cm/sStenosisPlaque DescriptionComments           +----------+--------+--------+--------+------------------+------------------+ CCA Prox  48      12                                                   +----------+--------+--------+--------+------------------+------------------+ CCA Distal40      9                                 intimal thickening  +----------+--------+--------+--------+------------------+------------------+ ICA Prox  111     24      1-39%   focal and calcific                   +----------+--------+--------+--------+------------------+------------------+ ICA Mid   64      18                                tortuous           +----------+--------+--------+--------+------------------+------------------+ ICA Distal122     30                                                   +----------+--------+--------+--------+------------------+------------------+ ECA       35      1                                                    +----------+--------+--------+--------+------------------+------------------+ +----------+--------+--------+----------------+-------------------+           PSV cm/sEDV cm/sDescribe        Arm Pressure (mmHG) +----------+--------+--------+----------------+-------------------+ [id number]             Multiphasic, [id number]                 +----------+--------+--------+----------------+-------------------+ +---------+--------+--+--------+--+---------+ VertebralPSV cm/s85EDV cm/s12Antegrade +---------+--------+--+--------+--+---------+   Summary: Right Carotid: Velocities in the right ICA are consistent with a 1-39% stenosis. Left Carotid: Velocities in the left ICA are consistent with a 1-39% stenosis. Vertebrals:  Bilateral vertebral arteries demonstrate antegrade flow. Subclavians: Normal flow hemodynamics were seen in bilateral subclavian              arteries. *See table(s) above for measurements and observations. Suggest follow up study in 12 months. Electronically signed by Dorn Ross MD on 03/02/2024 at 6:42:02 PM.    Final    ECHOCARDIOGRAM COMPLETE Result Date: 02/29/2024    ECHOCARDIOGRAM REPORT   Patient Name:   Cheryl [name] Date of Exam: 02/29/2024 Medical Rec #:  [id number]        Height:       64.0 in  Accession #:    [id number]       Weight:       125.0 lb Date of  Birth:  January 05, 1951        BSA:          1.602 m Patient Age:    73 years         BP:           148/74 mmHg Patient Gender: F                HR:           55 bpm. Exam Location:  [address] Procedure: 2D Echo, Cardiac Doppler and Color Doppler (Both Spectral and Color            Flow Doppler were utilized during procedure). Indications:    Aneurysm of descending thoracic aorta without rupture [I71.23                 (ICD-10-CM)]  History:        Patient has prior history of Echocardiogram examinations, most                 recent 09/19/2022. Apical aneurysm, CAD; Risk                 Factors:Hypertension, Dyslipidemia and Non-Smoker.  Sonographer:    Rosaline Fujisawa MHA, RDMS, RVT, RDCS Referring Phys: [id number] MAUDE JAYSON EMMER  Sonographer Comments: Attempted IV for Definity  administration by two staff members without success. Patient admits to only having a sip of juice to take her medications this morning. Advised patient of potential need to return for limited echocardiogram  with Definity , and if needed should hydrate prior to exam. IMPRESSIONS  1. Large (> 4 cm2) apical aneurysm without evidence of LV apical thrombus on non-contrast enhanced images. Unable to attempt Definity  due to IV failure. Left ventricular ejection fraction, by estimation, is 50 to 55%. The left ventricle has low normal function. The left ventricle demonstrates regional wall motion abnormalities (see scoring diagram/findings for description). Left ventricular diastolic parameters were normal.  2. Right ventricular systolic function is normal. The right ventricular size is normal.  3. The mitral valve is normal in structure. Mild mitral valve regurgitation. No evidence of mitral stenosis.  4. The aortic valve is tricuspid. There is mild thickening of the aortic valve. Aortic valve regurgitation is moderate. Comparison(s): Prior images reviewed side by side. Difficulty comparison; similar to prior but this study was unabel to use echo  contrast. Suspect no change. FINDINGS  Left Ventricle: Large (> 4 cm2) apical aneurysm without evidence of LV apical thrombus on non-contrast enhanced images. Unable to attempt Definity  due to IV failure. Left ventricular ejection fraction, by estimation, is 50 to 55%. The left ventricle has  low normal function. The left ventricle demonstrates regional wall motion abnormalities. The left ventricular internal cavity size was normal in size. There is no left ventricular hypertrophy. Left ventricular diastolic parameters were normal.  LV Wall Scoring: The apical lateral segment, apical septal segment, apical anterior segment, and apical inferior segment are akinetic. Right Ventricle: The right ventricular size is normal. No increase in right ventricular wall thickness. Right ventricular systolic function is normal. Left Atrium: Left atrial size was normal in size. Right Atrium: Right atrial size was normal in size. Pericardium: There is no evidence of pericardial effusion. Mitral Valve: The mitral valve is normal in structure. Mild mitral valve regurgitation. No evidence of mitral valve stenosis. Tricuspid Valve: The tricuspid  valve is normal in structure. Tricuspid valve regurgitation is not demonstrated. No evidence of tricuspid stenosis. Aortic Valve: The aortic valve is tricuspid. There is mild thickening of the aortic valve. Aortic valve regurgitation is moderate. Aortic regurgitation PHT measures 630 msec. Aortic valve mean gradient measures 3.0 mmHg. Aortic valve peak gradient measures 6.5 mmHg. Aortic valve area, by VTI measures 2.49 cm. Pulmonic Valve: The pulmonic valve was normal in structure. Pulmonic valve regurgitation is mild. No evidence of pulmonic stenosis. Aorta: The aortic root and ascending aorta are structurally normal, with no evidence of dilitation. IAS/Shunts: No atrial level shunt detected by color flow Doppler.  LEFT VENTRICLE PLAX 2D LVIDd:         4.75 cm   Diastology LVIDs:         3.20  cm   LV e' medial:    6.74 cm/s LV PW:         0.74 cm   LV E/e' medial:  6.5 LV IVS:        0.56 cm   LV e' lateral:   9.36 cm/s LVOT diam:     2.14 cm   LV E/e' lateral: 4.7 LV SV:         81 LV SV Index:   51 LVOT Area:     3.60 cm  RIGHT VENTRICLE            IVC RV Basal diam:  3.30 cm    IVC diam: 0.92 cm RV Mid diam:    2.66 cm RV S prime:     9.90 cm/s LEFT ATRIUM             Index        RIGHT ATRIUM           Index LA diam:        3.35 cm 2.09 cm/m   RA Area:     14.00 cm LA Vol (A2C):   35.3 ml 22.03 ml/m  RA Volume:   34.60 ml  21.60 ml/m LA Vol (A4C):   28.8 ml 17.98 ml/m LA Biplane Vol: 34.4 ml 21.47 ml/m  AORTIC VALVE AV Area (Vmax):    2.52 cm AV Area (Vmean):   2.41 cm AV Area (VTI):     2.49 cm AV Vmax:           127.00 cm/s AV Vmean:          85.200 cm/s AV VTI:            0.326 m AV Peak Grad:      6.5 mmHg AV Mean Grad:      3.0 mmHg LVOT Vmax:         89.10 cm/s LVOT Vmean:        57.100 cm/s LVOT VTI:          0.226 m LVOT/AV VTI ratio: 0.69 AI PHT:            630 msec  AORTA Ao Root diam: 3.19 cm Ao Asc diam:  2.38 cm MITRAL VALVE                           TRICUSPID VALVE MV Area (PHT): 2.22 cm                TR Peak grad:   18.0 mmHg MV Decel Time: 342 msec                TR  Vmax:        212.00 cm/s MR Peak grad:    86.1 mmHg MR Vmax:         464.00 cm/s           SHUNTS MR PISA Nyquist:             -0.3 m/s  Systemic VTI:  0.23 m MR PISA:         3.82 cm              Systemic Diam: 2.14 cm MR PISA Radius:  0.78 cm MV E velocity: 44.00 cm/s MV A velocity: 68.10 cm/s MV E/A ratio:  0.65 Stanly Leavens MD Electronically signed by Stanly Leavens MD Signature Date/Time: 02/29/2024/4:26:21 PM    Final    Estimated Creatinine Clearance: 26.7 mL/min (A) (by C-G formula based on SCr of 1.57 mg/dL (H)).   Assessment & Plan:   Hypertension, unspecified type- BP is well controlled. -     EKG 12-Lead -     Basic metabolic panel with GFR; Future -     CBC with  Differential/Platelet; Future  Poisoning by vitamin D , accidental or unintentional, sequela -     VITAMIN D  25 Hydroxy (Vit-D Deficiency, Fractures); Future  Bradycardia- No syncope.  Chronic kidney disease, stage 4 (severe) (HCC)- Renal function is stable.     Follow-up: Return in about 6 months (around 09/04/2024).  Debby Molt, MD "

## 2024-03-11 ENCOUNTER — Other Ambulatory Visit (HOSPITAL_COMMUNITY): Payer: Self-pay

## 2024-03-11 ENCOUNTER — Other Ambulatory Visit: Payer: Self-pay | Admitting: Internal Medicine

## 2024-03-11 MED ORDER — ATORVASTATIN CALCIUM 80 MG PO TABS
80.0000 mg | ORAL_TABLET | Freq: Every day | ORAL | 11 refills | Status: AC
  Filled 2024-03-11: qty 30, 30d supply, fill #0

## 2024-09-05 ENCOUNTER — Ambulatory Visit: Admitting: Internal Medicine

## 2024-10-17 ENCOUNTER — Ambulatory Visit
